# Patient Record
Sex: Male | Born: 1945 | Race: White | Hispanic: No | Marital: Married | Smoking: Former smoker
Health system: Southern US, Community
[De-identification: ages and names within clinical notes are randomized; demographics above are authoritative.]

## PROBLEM LIST (undated history)

## (undated) DIAGNOSIS — K219 Gastro-esophageal reflux disease without esophagitis: Secondary | ICD-10-CM

## (undated) DIAGNOSIS — I10 Essential (primary) hypertension: Secondary | ICD-10-CM

## (undated) DIAGNOSIS — I480 Paroxysmal atrial fibrillation: Secondary | ICD-10-CM

## (undated) DIAGNOSIS — C801 Malignant (primary) neoplasm, unspecified: Secondary | ICD-10-CM

## (undated) DIAGNOSIS — Z9221 Personal history of antineoplastic chemotherapy: Secondary | ICD-10-CM

## (undated) DIAGNOSIS — R37 Sexual dysfunction, unspecified: Secondary | ICD-10-CM

## (undated) DIAGNOSIS — I499 Cardiac arrhythmia, unspecified: Secondary | ICD-10-CM

## (undated) DIAGNOSIS — H409 Unspecified glaucoma: Secondary | ICD-10-CM

## (undated) DIAGNOSIS — E119 Type 2 diabetes mellitus without complications: Secondary | ICD-10-CM

## (undated) DIAGNOSIS — R7303 Prediabetes: Secondary | ICD-10-CM

## (undated) DIAGNOSIS — E785 Hyperlipidemia, unspecified: Secondary | ICD-10-CM

## (undated) HISTORY — PX: ILEOSTOMY: SHX1783

## (undated) HISTORY — PX: APPENDECTOMY: SHX54

## (undated) HISTORY — DX: Unspecified glaucoma: H40.9

---

## 2014-06-12 DIAGNOSIS — K635 Polyp of colon: Secondary | ICD-10-CM | POA: Insufficient documentation

## 2014-06-12 DIAGNOSIS — R37 Sexual dysfunction, unspecified: Secondary | ICD-10-CM | POA: Insufficient documentation

## 2014-06-12 DIAGNOSIS — I4891 Unspecified atrial fibrillation: Secondary | ICD-10-CM | POA: Insufficient documentation

## 2014-06-12 DIAGNOSIS — E7800 Pure hypercholesterolemia, unspecified: Secondary | ICD-10-CM | POA: Insufficient documentation

## 2014-06-12 DIAGNOSIS — K219 Gastro-esophageal reflux disease without esophagitis: Secondary | ICD-10-CM | POA: Insufficient documentation

## 2014-06-12 DIAGNOSIS — I10 Essential (primary) hypertension: Secondary | ICD-10-CM | POA: Insufficient documentation

## 2014-09-02 DIAGNOSIS — R7303 Prediabetes: Secondary | ICD-10-CM | POA: Insufficient documentation

## 2014-09-02 DIAGNOSIS — E1169 Type 2 diabetes mellitus with other specified complication: Secondary | ICD-10-CM | POA: Insufficient documentation

## 2015-10-22 ENCOUNTER — Encounter: Payer: Self-pay | Admitting: *Deleted

## 2015-10-25 ENCOUNTER — Ambulatory Visit
Admission: RE | Admit: 2015-10-25 | Discharge: 2015-10-25 | Disposition: A | Discharge status: Home / Self Care | Payer: Medicare Other | Source: Ambulatory Visit | Attending: Unknown Physician Specialty | Admitting: Unknown Physician Specialty

## 2015-10-25 ENCOUNTER — Ambulatory Visit: Payer: Medicare Other | Admitting: Anesthesiology

## 2015-10-25 ENCOUNTER — Encounter: Payer: Self-pay | Admitting: Anesthesiology

## 2015-10-25 ENCOUNTER — Encounter
Admission: RE | Disposition: A | Discharge status: Home / Self Care | Payer: Self-pay | Source: Ambulatory Visit | Attending: Unknown Physician Specialty

## 2015-10-25 DIAGNOSIS — Z7982 Long term (current) use of aspirin: Secondary | ICD-10-CM | POA: Diagnosis not present

## 2015-10-25 DIAGNOSIS — I480 Paroxysmal atrial fibrillation: Secondary | ICD-10-CM | POA: Diagnosis not present

## 2015-10-25 DIAGNOSIS — E119 Type 2 diabetes mellitus without complications: Secondary | ICD-10-CM | POA: Diagnosis not present

## 2015-10-25 DIAGNOSIS — K219 Gastro-esophageal reflux disease without esophagitis: Secondary | ICD-10-CM | POA: Insufficient documentation

## 2015-10-25 DIAGNOSIS — Z79899 Other long term (current) drug therapy: Secondary | ICD-10-CM | POA: Insufficient documentation

## 2015-10-25 DIAGNOSIS — E785 Hyperlipidemia, unspecified: Secondary | ICD-10-CM | POA: Insufficient documentation

## 2015-10-25 DIAGNOSIS — K640 First degree hemorrhoids: Secondary | ICD-10-CM | POA: Insufficient documentation

## 2015-10-25 DIAGNOSIS — Z87891 Personal history of nicotine dependence: Secondary | ICD-10-CM | POA: Insufficient documentation

## 2015-10-25 DIAGNOSIS — K5730 Diverticulosis of large intestine without perforation or abscess without bleeding: Secondary | ICD-10-CM | POA: Diagnosis not present

## 2015-10-25 DIAGNOSIS — I10 Essential (primary) hypertension: Secondary | ICD-10-CM | POA: Diagnosis not present

## 2015-10-25 DIAGNOSIS — Z1211 Encounter for screening for malignant neoplasm of colon: Secondary | ICD-10-CM | POA: Insufficient documentation

## 2015-10-25 HISTORY — PX: COLONOSCOPY WITH PROPOFOL: SHX5780

## 2015-10-25 HISTORY — DX: Hyperlipidemia, unspecified: E78.5

## 2015-10-25 HISTORY — DX: Gastro-esophageal reflux disease without esophagitis: K21.9

## 2015-10-25 HISTORY — DX: Paroxysmal atrial fibrillation: I48.0

## 2015-10-25 HISTORY — DX: Essential (primary) hypertension: I10

## 2015-10-25 HISTORY — DX: Type 2 diabetes mellitus without complications: E11.9

## 2015-10-25 HISTORY — DX: Sexual dysfunction, unspecified: R37

## 2015-10-25 HISTORY — DX: Cardiac arrhythmia, unspecified: I49.9

## 2015-10-25 SURGERY — COLONOSCOPY WITH PROPOFOL
Anesthesia: General

## 2015-10-25 MED ORDER — SODIUM CHLORIDE 0.9 % IV SOLN: INTRAVENOUS | Status: DC

## 2015-10-25 MED ORDER — MIDAZOLAM HCL 5 MG/5ML IJ SOLN
INTRAMUSCULAR | Status: DC | PRN
  Administered 2015-10-25: 1 mg via INTRAVENOUS

## 2015-10-25 MED ORDER — LIDOCAINE HCL (PF) 1 % IJ SOLN
2.0000 mL | Freq: Once | INTRAMUSCULAR | Status: AC
  Administered 2015-10-25: 0.03 mL via INTRADERMAL

## 2015-10-25 MED ORDER — PROPOFOL 10 MG/ML IV BOLUS
INTRAVENOUS | Status: DC | PRN
  Administered 2015-10-25: 50 mg via INTRAVENOUS

## 2015-10-25 MED ORDER — PROPOFOL 500 MG/50ML IV EMUL
INTRAVENOUS | Status: DC | PRN
  Administered 2015-10-25: 165 ug/kg/min via INTRAVENOUS

## 2015-10-25 MED ORDER — SODIUM CHLORIDE 0.9 % IV SOLN
INTRAVENOUS | Status: DC
Start: 2015-10-25 — End: 2015-10-25
  Administered 2015-10-25: 14:00:00 via INTRAVENOUS
  Administered 2015-10-25: 1000 mL via INTRAVENOUS

## 2015-10-25 MED ORDER — FENTANYL CITRATE (PF) 100 MCG/2ML IJ SOLN
INTRAMUSCULAR | Status: DC | PRN
  Administered 2015-10-25: 50 ug via INTRAVENOUS

## 2015-10-25 MED ORDER — LIDOCAINE HCL (PF) 1 % IJ SOLN
INTRAMUSCULAR | Status: AC
  Administered 2015-10-25: 0.03 mL via INTRADERMAL
  Filled 2015-10-25: qty 2

## 2015-10-25 MED ORDER — LIDOCAINE HCL (CARDIAC) 20 MG/ML IV SOLN
INTRAVENOUS | Status: DC | PRN
  Administered 2015-10-25: 30 mg via INTRAVENOUS

## 2015-10-25 NOTE — Anesthesia Preprocedure Evaluation (Signed)
Anesthesia Evaluation  Patient identified by MRN, date of birth, ID band Patient awake    Reviewed: Allergy & Precautions, H&P , NPO status , Patient's Chart, lab work & pertinent test results, reviewed documented beta blocker date and time   History of Anesthesia Complications Negative for: history of anesthetic complications  Airway Mallampati: III  TM Distance: >3 FB Neck ROM: full    Dental no notable dental hx. (+) Teeth Intact   Pulmonary neg shortness of breath, neg sleep apnea, neg COPD, neg recent URI, former smoker,    Pulmonary exam normal breath sounds clear to auscultation       Cardiovascular Exercise Tolerance: Good hypertension, On Medications and On Home Beta Blockers (-) angina(-) CAD, (-) Past MI, (-) Cardiac Stents and (-) CABG Normal cardiovascular exam+ dysrhythmias Atrial Fibrillation (-) Valvular Problems/Murmurs Rhythm:regular Rate:Normal     Neuro/Psych negative neurological ROS  negative psych ROS   GI/Hepatic Neg liver ROS, GERD  Medicated and Controlled,  Endo/Other  diabetes (pre-diabetes)  Renal/GU negative Renal ROS  negative genitourinary   Musculoskeletal   Abdominal   Peds  Hematology negative hematology ROS (+)   Anesthesia Other Findings Past Medical History:   Hypertension                                                 Dysrhythmia                                                  Paroxysmal atrial fibrillation (HCC)                         GERD (gastroesophageal reflux disease)                       Sexual dysfunction                                           Hyperlipidemia                                               Diabetes mellitus without complication (HCC)                 Reproductive/Obstetrics negative OB ROS                             Anesthesia Physical Anesthesia Plan  ASA: III  Anesthesia Plan: General   Post-op Pain Management:     Induction:   Airway Management Planned:   Additional Equipment:   Intra-op Plan:   Post-operative Plan:   Informed Consent: I have reviewed the patients History and Physical, chart, labs and discussed the procedure including the risks, benefits and alternatives for the proposed anesthesia with the patient or authorized representative who has indicated his/her understanding and acceptance.   Dental Advisory Given  Plan Discussed with: Anesthesiologist, CRNA and Surgeon  Anesthesia Plan Comments:  Anesthesia Quick Evaluation  

## 2015-10-25 NOTE — H&P (Signed)
   Primary Care Physician:  Dion Body, MD Primary Gastroenterologist:  Dr. Vira Agar  Pre-Procedure History & Physical: HPI:  Randy Wheeler is a 69 y.o. male is here for an colonoscopy.   Past Medical History  Diagnosis Date  . Hypertension   . Dysrhythmia   . Paroxysmal atrial fibrillation (HCC)   . GERD (gastroesophageal reflux disease)   . Sexual dysfunction   . Hyperlipidemia   . Diabetes mellitus without complication [facility])     Past Surgical History  Procedure Laterality Date  . Appendectomy      Prior to Admission medications   Medication Sig Start Date End Date Taking? Authorizing Provider  aspirin 81 MG tablet Take 81 mg by mouth daily.   Yes Historical Provider, MD  hydrochlorothiazide (HYDRODIURIL) 25 MG tablet Take 25 mg by mouth daily.   Yes Historical Provider, MD  metoprolol succinate (TOPROL-XL) 50 MG 24 hr tablet Take 50 mg by mouth daily. Take with or immediately following a meal.   Yes Historical Provider, MD  omeprazole (PRILOSEC) 20 MG capsule Take 20 mg by mouth daily.   Yes Historical Provider, MD  Red Yeast Rice 600 MG CAPS Take 1 capsule by mouth daily.   Yes Historical Provider, MD    Allergies as of 09/27/2015  . (Not on File)    History reviewed. No pertinent family history.  Social History   Social History  . Marital Status: Married    Spouse Name: N/A  . Number of Children: N/A  . Years of Education: N/A   Occupational History  . Not on file.   Social History Main Topics  . Smoking status: Former Smoker    Types: Cigarettes    Quit date: 10/21/1997  . Smokeless tobacco: Never Used  . Alcohol Use: Yes  . Drug Use: No  . Sexual Activity: Not on file   Other Topics Concern  . Not on file   Social History Narrative    Review of Systems: See HPI, otherwise negative ROS  Physical Exam: BP 129/92 mmHg  Pulse 88  Temp(Src) 98.1 F (36.7 C)  Resp 17  Ht 6' (1.829 m)  Wt 92.987 kg (205 lb)  BMI 27.80 kg/m2  SpO2  99% General:   Alert,  pleasant and cooperative in NAD Head:  Normocephalic and atraumatic. Neck:  Supple; no masses or thyromegaly. Lungs:  Clear throughout to auscultation.    Heart:  Regular rate and rhythm. Abdomen:  Soft, nontender and nondistended. Normal bowel sounds, without guarding, and without rebound.   Neurologic:  Alert and  oriented x4;  grossly normal neurologically.  Impression/Plan: Randy Wheeler is here for an colonoscopy to be performed for screening  Risks, benefits, limitations, and alternatives regarding  colonoscopy have been reviewed with the patient.  Questions have been answered.  All parties agreeable.   Gaylyn Cheers, MD  10/25/2015, 1:59 PM

## 2015-10-25 NOTE — Transfer of Care (Signed)
Immediate Anesthesia Transfer of Care Note  Patient: Randy Wheeler  Procedure(s) Performed: Procedure(s): COLONOSCOPY WITH PROPOFOL (N/A)  Patient Location: PACU and Short Stay  Anesthesia Type:General  Level of Consciousness: awake, oriented and patient cooperative  Airway & Oxygen Therapy: Patient Spontanous Breathing and Patient connected to nasal cannula oxygen  Post-op Assessment: Report given to RN and Post -op Vital signs reviewed and stable  Post vital signs: Reviewed and stable  Last Vitals:  Filed Vitals:   10/25/15 1432  BP:   Pulse: 87  Temp: 35.8 C  Resp:     Complications: No apparent anesthesia complications

## 2015-10-25 NOTE — Op Note (Signed)
[facility]astro[facility] Patient Name: Jonothan Heberle Procedure Date: 10/25/2015 1:51 PM MRN: [id number] Account #: [id number] Date of Birth: 05-04-1946 Admit Type: Outpatient Age: 69 Room: [facility] ENDO ROOM 1 Gender: Male Note Status: Finalized Procedure:         Colonoscopy Indications:       Screening for colorectal malignant neoplasm Providers:         Manya Silvas, MD Referring MD:      Dion Body (Referring MD) Medicines:         Propofol per Anesthesia Complications:     No immediate complications. Procedure:         Pre-Anesthesia Assessment:                    - After reviewing the risks and benefits, the patient was                     deemed in satisfactory condition to undergo the procedure.                    After obtaining informed consent, the colonoscope was                     passed under direct vision. Throughout the procedure, the                     patient's blood pressure, pulse, and oxygen saturations                     were monitored continuously. The Colonoscope was                     introduced through the anus and advanced to the the cecum,                     identified by appendiceal orifice and ileocecal valve. The                     colonoscopy was performed without difficulty. The patient                     tolerated the procedure well. The quality of the bowel                     preparation was good. Findings:      Multiple small and large-mouthed diverticula were found in the sigmoid       colon and in the descending colon.      Internal hemorrhoids were found during endoscopy. The hemorrhoids were       small, medium-sized and Grade I (internal hemorrhoids that do not       prolapse).      The exam was otherwise without abnormality. Impression:        - Diverticulosis in the sigmoid colon and in the                     descending colon.                    - Internal hemorrhoids.                    - The  examination was otherwise normal.                    - No  specimens collected. Recommendation:    - Repeat colonoscopy in 10 years for screening purposes. Manya Silvas, MD 10/25/2015 2:27:45 PM This report has been signed electronically. Number of Addenda: 0 Note Initiated On: 10/25/2015 1:51 PM Scope Withdrawal Time: 0 hours 4 minutes 45 seconds  Total Procedure Duration: 0 hours 11 minutes 18 seconds       [facility]

## 2015-10-26 ENCOUNTER — Encounter: Payer: Self-pay | Admitting: Unknown Physician Specialty

## 2015-10-26 NOTE — Anesthesia Postprocedure Evaluation (Signed)
  Anesthesia Post-op Note  Patient: Randy Wheeler  Procedure(s) Performed: Procedure(s): COLONOSCOPY WITH PROPOFOL (N/A)  Anesthesia type:General  Patient location: PACU  Post pain: Pain level controlled  Post assessment: Post-op Vital signs reviewed, Patient's Cardiovascular Status Stable, Respiratory Function Stable, Patent Airway and No signs of Nausea or vomiting  Post vital signs: Reviewed and stable  Last Vitals:  Filed Vitals:   10/25/15 1502  BP: 116/99  Pulse: 71  Temp:   Resp: 13    Level of consciousness: awake, alert  and patient cooperative  Complications: No apparent anesthesia complications

## 2016-05-23 DIAGNOSIS — Z7189 Other specified counseling: Secondary | ICD-10-CM | POA: Insufficient documentation

## 2016-05-23 DIAGNOSIS — Z7185 Encounter for immunization safety counseling: Secondary | ICD-10-CM | POA: Insufficient documentation

## 2017-02-28 ENCOUNTER — Ambulatory Visit (INDEPENDENT_AMBULATORY_CARE_PROVIDER_SITE_OTHER): Payer: Medicare Other | Admitting: Urology

## 2017-02-28 ENCOUNTER — Encounter: Payer: Self-pay | Admitting: Urology

## 2017-02-28 VITALS — BP 133/95 | HR 98 | Ht 72.0 in | Wt 211.0 lb

## 2017-02-28 DIAGNOSIS — Z87891 Personal history of nicotine dependence: Secondary | ICD-10-CM

## 2017-02-28 DIAGNOSIS — N529 Male erectile dysfunction, unspecified: Secondary | ICD-10-CM | POA: Diagnosis not present

## 2017-02-28 DIAGNOSIS — R310 Gross hematuria: Secondary | ICD-10-CM

## 2017-02-28 LAB — URINALYSIS, COMPLETE
Bilirubin, UA: NEGATIVE
GLUCOSE, UA: NEGATIVE
Ketones, UA: NEGATIVE
LEUKOCYTES UA: NEGATIVE
Nitrite, UA: NEGATIVE
Specific Gravity, UA: 1.01 (ref 1.005–1.030)
UUROB: 0.2 mg/dL (ref 0.2–1.0)
pH, UA: 7 (ref 5.0–7.5)

## 2017-02-28 LAB — MICROSCOPIC EXAMINATION: RBC, UA: >30 /hpf — ABNORMAL HIGH (ref 0–?)

## 2017-02-28 NOTE — Progress Notes (Signed)
02/28/2017 7:56 AM   Randy Wheeler 1946-04-25 [id number]  Referring provider: Dion Body, MD [facility] [place], [place] [address]  Chief Complaint  Patient presents with  . Hematuria    New Patient    HPI: 71 year old male from Dr. Netty Starring who presents today for further evaluation of microscopic hematuria/ gross hematuria.  He did have an episodes of painless gross hematuria after fall on a boat in 07/2016.  He has seen trace blood in his urine a few other times including last week which was bright red lasting a few days.  Few small clots which finally cleared up 2 days ago.    Urinalysis performed his PCP showed greater than 50 red blood cells per high-powered field. He also had 4-10 white blood cells with cloudy red colored urine. His urine culture associated with this was negative.  UA repeated today in our office is very similar with greater than 30 red blood cells per high-powered field and 6-10 white blood cells per. 1+ protein. Few bacteria.  He does have personal history of Afib on Eliquis.   He stopped taking this under the direction of his PCP on his gross hematuria progressed.  No accociated dysuria, UTIs, or flank pain.     At baseline, he does have some post void dribbling, nocturia x 1.  Stream is decent.  No urgency or frequency.      He is a remote smoker, 20 years x 1 ppd but quit 1998.    He does have baseline ED.     PMH: Past Medical History:  Diagnosis Date  . Diabetes mellitus without complication ([place])   . Dysrhythmia   . GERD (gastroesophageal reflux disease)   . Hyperlipidemia   . Hypertension   . Paroxysmal atrial fibrillation (HCC)   . Sexual dysfunction     Surgical History: Past Surgical History:  Procedure Laterality Date  . APPENDECTOMY    . COLONOSCOPY WITH PROPOFOL N/A 10/25/2015   Procedure: COLONOSCOPY WITH PROPOFOL;  Surgeon: Manya Silvas, MD;  Location: [facility] ENDOSCOPY;  Service:  Endoscopy;  Laterality: N/A;    Home Medications:  Allergies as of 02/28/2017   No Known Allergies     Medication List       Accurate as of 02/28/17 11:59 PM. Always use your most recent med list.          ELIQUIS 5 MG Tabs tablet Generic drug:  apixaban TAKE ONE TABLET TWICE DAILY   hydrochlorothiazide 25 MG tablet Commonly known as:  HYDRODIURIL Take 25 mg by mouth daily.   metoprolol succinate 50 MG 24 hr tablet Commonly known as:  TOPROL-XL Take 50 mg by mouth daily. Take with or immediately following a meal.   omeprazole 20 MG capsule Commonly known as:  PRILOSEC Take 20 mg by mouth daily.   Red Yeast Rice 600 MG Caps Take 1 capsule by mouth daily.       Allergies: No Known Allergies  Family History: Family History  Problem Relation Age of Onset  . Prostate cancer Father   . Stroke Father   . Kidney cancer Neg Hx     Social History:  reports that he quit smoking about 19 years ago. His smoking use included Cigarettes. He has never used smokeless tobacco. He reports that he drinks alcohol. He reports that he does not use drugs.  ROS: UROLOGY Frequent Urination?: No Hard to postpone urination?: No Burning/pain with urination?: No Get up at night  to urinate?: Yes Leakage of urine?: No Urine stream starts and stops?: Yes Trouble starting stream?: No Do you have to strain to urinate?: No Blood in urine?: Yes Urinary tract infection?: No Sexually transmitted disease?: No Injury to kidneys or bladder?: No Painful intercourse?: No Weak stream?: Yes Erection problems?: Yes Penile pain?: No  Gastrointestinal Nausea?: No Vomiting?: No Indigestion/heartburn?: Yes Diarrhea?: No Constipation?: No  Constitutional Fever: No Night sweats?: No Weight loss?: No Fatigue?: No  Skin Skin rash/lesions?: No Itching?: No  Eyes Blurred vision?: No Double vision?: No  Ears/Nose/Throat Sore throat?: No Sinus problems?:  No  Hematologic/Lymphatic Swollen glands?: No Easy bruising?: No  Cardiovascular Leg swelling?: No Chest pain?: No  Respiratory Cough?: No Shortness of breath?: No  Endocrine Excessive thirst?: No  Musculoskeletal Back pain?: No Joint pain?: Yes  Neurological Headaches?: No Dizziness?: No  Psychologic Depression?: No Anxiety?: No  Physical Exam: BP (!) 133/95   Pulse 98   Ht 6' (1.829 m)   Wt 211 lb (95.7 kg)   BMI 28.62 kg/m   Constitutional:  Alert and oriented, No acute distress. HEENT: [place] AT, moist mucus membranes.  Trachea midline, no masses. Cardiovascular: No clubbing, cyanosis, or edema. Respiratory: Normal respiratory effort, no increased work of breathing. GI: Abdomen is soft, nontender, nondistended, no abdominal masses GU: No CVA tenderness.  Skin: No rashes, bruises or suspicious lesions. Neurologic: Grossly intact, no focal deficits, moving all 4 extremities. Psychiatric: Normal mood and affect.  Laboratory Data: Most recent PSA 08/2015  0.87  Urinalysis Results for orders placed or performed in visit on 02/28/17  Microscopic Examination  Result Value Ref Range   WBC, UA 6-10 (A) 0 - 5 /hpf   RBC, UA >30 (H) 0 - 2 /hpf   Epithelial Cells (non renal) 0-10 0 - 10 /hpf   Mucus, UA Present (A) Not Estab.   Bacteria, UA Few (A) None seen/Few  Urinalysis, Complete  Result Value Ref Range   Specific Gravity, UA 1.010 1.005 - 1.030   pH, UA 7.0 5.0 - 7.5   Color, UA Yellow Yellow   Appearance Ur Cloudy (A) Clear   Leukocytes, UA Negative Negative   Protein, UA 1+ (A) Negative/Trace   Glucose, UA Negative Negative   Ketones, UA Negative Negative   RBC, UA 3+ (A) Negative   Bilirubin, UA Negative Negative   Urobilinogen, Ur 0.2 0.2 - 1.0 mg/dL   Nitrite, UA Negative Negative   Microscopic Examination See below:     Pertinent Imaging: pending  Assessment & Plan:    1. Gross hematuria We discussed the differential diagnosis for  microscopic/ gross hematuria including nephrolithiasis, renal or upper tract tumors, bladder stones, UTIs, or bladder tumors as well as undetermined etiologies.  Per AUA guidelines, I did recommend complete microscopic hematuria evaluation including CTU, possible urine cytology, and office cystoscopy.  I have recommended continuing to hold Xarelto until workup complete given the severity of his hematuria.  - Urinalysis, Complete - CT ABDOMEN PELVIS W WO CONTRAST; Future  2. Erectile dysfunction, unspecified erectile dysfunction type Will discuss further in future  3. History of smoking Increased risk for TCC   Return in about 4 weeks (around 03/28/2017) for cysto.  Hollice Espy, MD  [facility] [address] [place], [place] [address] [phone]

## 2017-03-09 ENCOUNTER — Telehealth: Payer: Self-pay | Admitting: Urology

## 2017-03-09 ENCOUNTER — Ambulatory Visit
Admission: RE | Admit: 2017-03-09 | Discharge: 2017-03-09 | Disposition: A | Discharge status: Home / Self Care | Payer: Medicare Other | Source: Ambulatory Visit | Attending: Urology | Admitting: Urology

## 2017-03-09 DIAGNOSIS — K869 Disease of pancreas, unspecified: Secondary | ICD-10-CM

## 2017-03-09 DIAGNOSIS — N329 Bladder disorder, unspecified: Secondary | ICD-10-CM | POA: Insufficient documentation

## 2017-03-09 DIAGNOSIS — R310 Gross hematuria: Secondary | ICD-10-CM

## 2017-03-09 LAB — POCT I-STAT CREATININE: Creatinine, Ser: 1 mg/dL (ref 0.61–1.24)

## 2017-03-09 MED ORDER — IOPAMIDOL (ISOVUE-300) INJECTION 61%
125.0000 mL | Freq: Once | INTRAVENOUS | Status: AC | PRN
  Administered 2017-03-09: 125 mL via INTRAVENOUS

## 2017-03-09 NOTE — Telephone Encounter (Signed)
CT scan findings reviewed with the patient. 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 There is no evidence of extravesical extension or lymphadenopathy.  Options discussed with patient including expediting office cystoscopy versus proceeding directly to the operating room for TURBT were discussed. He is most interested in proceeding with surgery as soon as possible. He will be on vacation the first week of April will like to have this done as soon as he returns.  Risk and benefits of the procedure were discussed at length. Given the location of the tumor, it may or may not involve the right ureteral orifice. As such, we discussed the possibility of placing a right ureteral stent if deemed necessary. Risks including bleeding, infection, damage to this running structures, bladder injury, need for further treatment were all reviewed. All of his questions were answered.  In addition, he does have a lesion on his pancreas which requires further workup. MRI ordered and this was discussed with the patient as well.  Plan to proceed with TURBT, possible right ureteral stent placement, instillation of mitomycin.  Randy Espy, MD

## 2017-03-12 NOTE — Telephone Encounter (Signed)
Pt would like for you to give him a call at your earliest convenience.  He said that was a lot of information he spoke with you about and would like for you to speak with his wife also.

## 2017-03-12 NOTE — Telephone Encounter (Signed)
Why don't you have them both come in on Thursday around 10:45?  I can review the CT in person with them which will be helpful.     Hollice Espy, MD

## 2017-03-12 NOTE — Telephone Encounter (Signed)
Spoke with patient and his wife and notified them that his surgery was scheduled for 03-26-17 for a TURBT, patient states he will be out of town so surgery was rescheduled for 04-02-17. Apt with [name] to discuss results in detail per previous message was made for Thursday 03-15-17@10 :45 and patient will then go to pre-op at 1pm. Patient verbalized understanding and is in agreement with this plan.

## 2017-03-15 ENCOUNTER — Ambulatory Visit (INDEPENDENT_AMBULATORY_CARE_PROVIDER_SITE_OTHER): Payer: Medicare Other | Admitting: Urology

## 2017-03-15 ENCOUNTER — Encounter: Payer: Self-pay | Admitting: Urology

## 2017-03-15 ENCOUNTER — Encounter
Admission: RE | Admit: 2017-03-15 | Discharge: 2017-03-15 | Disposition: A | Discharge status: Home / Self Care | Payer: Medicare Other | Source: Ambulatory Visit | Attending: Urology | Admitting: Urology

## 2017-03-15 VITALS — BP 114/76 | HR 86 | Ht 72.0 in | Wt 211.4 lb

## 2017-03-15 DIAGNOSIS — I10 Essential (primary) hypertension: Secondary | ICD-10-CM | POA: Diagnosis not present

## 2017-03-15 DIAGNOSIS — R310 Gross hematuria: Secondary | ICD-10-CM | POA: Diagnosis not present

## 2017-03-15 DIAGNOSIS — N3289 Other specified disorders of bladder: Secondary | ICD-10-CM

## 2017-03-15 DIAGNOSIS — Z01812 Encounter for preprocedural laboratory examination: Secondary | ICD-10-CM | POA: Insufficient documentation

## 2017-03-15 DIAGNOSIS — Z01810 Encounter for preprocedural cardiovascular examination: Secondary | ICD-10-CM | POA: Insufficient documentation

## 2017-03-15 DIAGNOSIS — K869 Disease of pancreas, unspecified: Secondary | ICD-10-CM

## 2017-03-15 DIAGNOSIS — I4891 Unspecified atrial fibrillation: Secondary | ICD-10-CM | POA: Diagnosis not present

## 2017-03-15 HISTORY — DX: Prediabetes: R73.03

## 2017-03-15 HISTORY — DX: Malignant (primary) neoplasm, unspecified: C80.1

## 2017-03-15 LAB — BASIC METABOLIC PANEL
ANION GAP: 8 (ref 5–15)
BUN: 28 mg/dL — ABNORMAL HIGH (ref 6–20)
CALCIUM: 9.4 mg/dL (ref 8.9–10.3)
CO2: 31 mmol/L (ref 22–32)
Chloride: 99 mmol/L — ABNORMAL LOW (ref 101–111)
Creatinine, Ser: 0.84 mg/dL (ref 0.61–1.24)
Glucose, Bld: 112 mg/dL — ABNORMAL HIGH (ref 65–99)
Potassium: 3.9 mmol/L (ref 3.5–5.1)
SODIUM: 138 mmol/L (ref 135–145)

## 2017-03-15 LAB — CBC
HEMATOCRIT: 44.1 % (ref 40.0–52.0)
Hemoglobin: 15.1 g/dL (ref 13.0–18.0)
MCH: 32 pg (ref 26.0–34.0)
MCHC: 34.2 g/dL (ref 32.0–36.0)
MCV: 93.5 fL (ref 80.0–100.0)
PLATELETS: 240 10*3/uL (ref 150–440)
RBC: 4.71 MIL/uL (ref 4.40–5.90)
RDW: 14.1 % (ref 11.5–14.5)
WBC: 6.7 10*3/uL (ref 3.8–10.6)

## 2017-03-15 LAB — URINALYSIS, COMPLETE (UACMP) WITH MICROSCOPIC
BACTERIA UA: NONE SEEN
Bilirubin Urine: NEGATIVE
Glucose, UA: NEGATIVE mg/dL
Ketones, ur: NEGATIVE mg/dL
LEUKOCYTES UA: NEGATIVE
Nitrite: NEGATIVE
PROTEIN: 100 mg/dL — AB
Specific Gravity, Urine: 1.019 (ref 1.005–1.030)
pH: 6 (ref 5.0–8.0)

## 2017-03-15 LAB — PROTIME-INR
INR: 0.99
PROTHROMBIN TIME: 13.1 s (ref 11.4–15.2)

## 2017-03-15 LAB — TYPE AND SCREEN
ABO/RH(D): O POS
Antibody Screen: NEGATIVE

## 2017-03-15 LAB — APTT: APTT: 29 s (ref 24–36)

## 2017-03-15 NOTE — Progress Notes (Signed)
03/15/2017 2:16 PM   Randy Wheeler [birth year] [id number]  Referring provider: Dion Body, MD [facility] [place],  [place] [id number]  Chief Complaint  Patient presents with  . Follow-up    Discuss Surgery    HPI: 71 year old male who returned today for follow-up CT urogram. This was obtained in the setting of episodic gross hematuria many months. 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  There is no associated pelvic lymphadenopathy for upper tract filling defects appreciated.   In addition, a 2 cm hypoattenuating lesion in the pancreas was identified incidentally.  He does have personal history of Afib on Eliquis.   He stopped taking this under the direction of his PCP on his gross hematuria progressed.  No accociated dysuria, UTIs, or flank pain.     At baseline, he does have some post void dribbling, nocturia x 1.  Stream is decent.  No urgency or frequency.      He is a remote smoker, 20 years x 1 ppd but quit 1998.   Findings were previously reviewed by telephone with the patient has additional questions today. He is also accompanied by his wife.   PMH: Past Medical History:  Diagnosis Date  . Cancer [facility])    Bladder  . Chronic kidney disease   . Dysrhythmia   . GERD (gastroesophageal reflux disease)   . Hyperlipidemia   . Hypertension   . Paroxysmal atrial fibrillation (HCC)   . Pre-diabetes    diet controlled  . Sexual dysfunction     Surgical History: Past Surgical History:  Procedure Laterality Date  . APPENDECTOMY    . COLONOSCOPY WITH PROPOFOL N/A 10/25/2015   Procedure: COLONOSCOPY WITH PROPOFOL;  Surgeon: Manya Silvas, MD;  Location: [facility] ENDOSCOPY;  Service: Endoscopy;  Laterality: N/A;    Home Medications:  Allergies as of 03/15/2017   No Known Allergies     Medication List       Accurate as of 03/15/17 11:59 PM. Always  use your most recent med list.          ELIQUIS 5 MG Tabs tablet Generic drug:  apixaban TAKE ONE TABLET TWICE DAILY   famotidine 10 MG chewable tablet Commonly known as:  PEPCID AC Chew 10 mg by mouth daily as needed for heartburn.   hydrochlorothiazide 25 MG tablet Commonly known as:  HYDRODIURIL Take 25 mg by mouth daily.   metoprolol succinate 50 MG 24 hr tablet Commonly known as:  TOPROL-XL Take 50 mg by mouth every evening. Take with or immediately following a meal.   omeprazole 20 MG capsule Commonly known as:  PRILOSEC Take 20 mg by mouth daily as needed (heartburn).   oxymetazoline 0.05 % nasal spray Commonly known as:  AFRIN Place 1 spray into left nostril at bedtime as needed for congestion.       Allergies: No Known Allergies  Family History: Family History  Problem Relation Age of Onset  . Prostate cancer Father   . Stroke Father   . Atrial fibrillation Father   . Diabetes type II Father   . Kidney cancer Neg Hx     Social History:  reports that he quit smoking about 19 years ago. His smoking use included Cigarettes. He smoked 1.00 pack per day. He has never used smokeless tobacco. He reports that he drinks alcohol. He reports that he does not use drugs.  ROS: UROLOGY Frequent Urination?:  No Hard to postpone urination?: No Burning/pain with urination?: No Get up at night to urinate?: No Leakage of urine?: No Urine stream starts and stops?: No Trouble starting stream?: No Do you have to strain to urinate?: No Blood in urine?: No Urinary tract infection?: No Sexually transmitted disease?: No Injury to kidneys or bladder?: No Painful intercourse?: No Weak stream?: No Erection problems?: No Penile pain?: No  Gastrointestinal Nausea?: No Vomiting?: No Indigestion/heartburn?: No Diarrhea?: No Constipation?: No  Constitutional Fever: No Night sweats?: No Weight loss?: No Fatigue?: No  Skin Skin rash/lesions?: No Itching?:  No  Eyes Blurred vision?: No Double vision?: No  Ears/Nose/Throat Sore throat?: No Sinus problems?: No  Hematologic/Lymphatic Swollen glands?: No Easy bruising?: No  Cardiovascular Leg swelling?: No Chest pain?: No  Respiratory Cough?: No Shortness of breath?: No  Endocrine Excessive thirst?: No  Musculoskeletal Back pain?: No Joint pain?: No  Neurological Headaches?: No Dizziness?: No  Psychologic Depression?: No Anxiety?: No  Physical Exam: BP 114/76 (BP Location: Left Arm, Patient Position: Sitting, Cuff Size: Normal)   Pulse 86   Ht 6' (1.829 m)   Wt 211 lb 6.4 oz (95.9 kg)   BMI 28.67 kg/m   Constitutional:  Alert and oriented, No acute distress.  Accompanied by wife today. HEENT:  AT, moist mucus membranes.  Trachea midline, no masses. Cardiovascular: No clubbing, cyanosis, or edema. Respiratory: Normal respiratory effort, no increased work of breathing. GI: Abdomen is soft, nontender, nondistended, no abdominal masses GU: No CVA tenderness.  Skin: No rashes, bruises or suspicious lesions. Neurologic: Grossly intact, no focal deficits, moving all 4 extremities. Psychiatric: Normal mood and affect.  Laboratory Data:  Lab Results  Component Value Date   CREATININE 0.84 03/15/2017   Urinalysis Pending preop  Pertinent Imaging: CLINICAL DATA:  Two week history of gross hematuria.  EXAM: CT ABDOMEN AND PELVIS WITHOUT AND WITH CONTRAST  TECHNIQUE: Multidetector CT imaging of the abdomen and pelvis was performed following the standard protocol before and following the bolus administration of intravenous contrast.  CONTRAST:  155mL ISOVUE-300 IOPAMIDOL (ISOVUE-300) INJECTION 61%  COMPARISON:  None.  FINDINGS: Lower chest: Calcified granuloma identified right lung base. Otherwise unremarkable.  Hepatobiliary: The liver shows diffusely decreased attenuation suggesting steatosis. 2 cm cyst identified lateral segment left liver.  Other smaller adjacent cysts noted. Tiny hypoattenuating lesion posterior right liver is too small to characterize. No enhancing mass within the liver parenchyma. There is no evidence for gallstones, gallbladder wall thickening, or pericholecystic fluid. No intrahepatic or extrahepatic biliary dilation.  Pancreas: Exophytic 1.9 x 1.5 cm low-density lesions identified at the uncinate process of the pancreas, along the cranial wall of the transverse duodenum (see image 36 series 4 and image 45 series 10). No dilatation main pancreatic duct.  Spleen: No splenomegaly. No focal mass lesion.  Adrenals/Urinary Tract: No adrenal nodule or mass.  Precontrast imaging shows no stones in either kidney. No ureteral or bladder stones.  Imaging after IV contrast administration shows no enhancing lesion in either kidney. 4.2 cm simple cyst identified upper pole left kidney.  Delayed imaging shows no abnormality of either intrarenal collecting system or renal pelvis. Both ureters are well opacified and show no focal dilatation, wall thickening, or intraluminal filling defect.  Delayed imaging through the bladder reveals any 4.2 x 5.6 x 4.8 cm polypoid mass lesion with irregular margins arising from the posterior right bladder wall.  Stomach/Bowel: Stomach is nondistended. No gastric wall thickening. No evidence of outlet obstruction. Duodenum is normally  positioned as is the ligament of Treitz. No small bowel wall thickening. No small bowel dilatation. The terminal ileum is normal. Nonvisualization appendix compatible with prior appendectomy. Diverticuli are seen scattered along the entire length of the colon without CT findings of diverticulitis.  Vascular/Lymphatic: There is abdominal aortic atherosclerosis without aneurysm. There is no gastrohepatic or hepatoduodenal ligament lymphadenopathy. No intraperitoneal or retroperitoneal lymphadenopathy. No pelvic sidewall  lymphadenopathy.  Reproductive: Prostate gland upper normal for size.  Other: No intraperitoneal free fluid.  Musculoskeletal: Bone windows reveal no worrisome lytic or sclerotic osseous lesions.  IMPRESSION: 1. 5.6 cm polypoid mass arises from the posterior right bladder wall, consistent with urothelial neoplasm. 2. 2 cm exophytic hypoattenuating lesion identified along the uncinate process of the pancreas. Dedicated abdominal MRI without and with contrast may prove helpful to further evaluate. 3.  Abdominal Aortic Atherosclerois (ICD10-170.0) These results will be called to the ordering clinician or representative by the Radiologist Assistant, and communication documented in the PACS or zVision Dashboard.   Electronically Signed   By: Misty Stanley M.D.   On: 03/09/2017 16:48  CT scan personally reviewed today with the patient.  Assessment & Plan:    1. Bladder mass 5.6 cm right posterior wall bladder lesion highly suspicious for TCC.  There is no evidence of extravesical extension or lymphadenopathy.  Options discussed with patient including expediting office cystoscopy versus proceeding directly to the operating room for TURBT were discussed. He is most interested in proceeding with surgery as soon as possible. He will be on vacation the first week of April will like to have this done as soon as he returns.  Risk and benefits of the procedure were discussed at length. Given the location of the tumor, it may or may not involve the right ureteral orifice. As such, we discussed the possibility of placing a right ureteral stent if deemed necessary. Risks including bleeding, infection, damage to this running structures, bladder injury, need for further treatment were all reviewed. All of his questions were answered.  2. Gross hematuria Likely secondary to #2  3. Pancreatic lesion In addition, he does have a lesion on his pancreas which requires further workup. MRI  ordered and this was discussed with the patient as well.   Plan to proceed with TURBT, possible right ureteral stent placement, instillation of mitomycin.   Hollice Espy, MD  [facility] [address], [place],  [id number] [phone]  I spent 25 min with this patient of which greater than 50% was spent in counseling and coordination of care with the patient.

## 2017-03-15 NOTE — Patient Instructions (Signed)
Your procedure is scheduled on: April 02, 2017 (Monday) Report to Same Day Surgery 2nd floor medical mall [facility] Entrance-take elevator on left to 2nd floor.  Check in with surgery information desk.) To find out your arrival time please call [phone] between 1PM - 3PM on March 30, 2017 (Friday)  Remember: Instructions that are not followed completely may result in serious medical risk, up to and including death, or upon the discretion of your surgeon and anesthesiologist your surgery may need to be rescheduled.    _x___ 1. Do not eat food or drink liquids after midnight. No gum chewing or hard candies                                 __x__ 2. No Alcohol for 24 hours before or after surgery.   __x__3. No Smoking for 24 prior to surgery.   ____  4. Bring all medications with you on the day of surgery if instructed.    __x__ 5. Notify your doctor if there is any change in your medical condition     (cold, fever, infections).     Do not wear jewelry, make-up, hairpins, clips or nail polish.  Do not wear lotions, powders, or perfumes. You may wear deodorant.  Do not shave 48 hours prior to surgery. Men may shave face and neck.  Do not bring valuables to the hospital.    [facility] is not responsible for any belongings or valuables.               Contacts, dentures or bridgework may not be worn into surgery.  Leave your suitcase in the car. After surgery it may be brought to your room.  For patients admitted to the hospital, discharge time is determined by your treatment team                      Patients discharged the day of surgery will not be allowed to drive home.  You will need someone to drive you home and stay with you the night of your procedure.    Please read over the following fact sheets that you were given:   [facility] Preparing for Surgery and or MRSA Information   _x___ Take anti-hypertensive (unless it includes a diuretic), cardiac, seizure, asthma,      anti-reflux and psychiatric medicines. These include:  1. Prilosec (Prilosec at bedtime on Sunday night prior to surgery)   .  ____Fleets enema or Magnesium Citrate as directed.   _x_ Use CHG Soap or sage wipes as directed on instruction sheet   ____ Use inhalers on the day of surgery and bring to hospital day of surgery  ____ Stop Metformin and Janumet 2 days prior to surgery.    ____ Take 1/2 of usual insulin dose the night before surgery and none on the morning     surgery.   _x___ Follow recommendations from Cardiologist, Pulmonologist or PCP regarding          stopping Aspirin, Coumadin, Pllavix ,Eliquis, Effient, or Pradaxa, and Pletal. (Eliquis on hold at present )  X____Stop Anti-inflammatories such as Advil, Aleve, Ibuprofen, Motrin, Naproxen, Naprosyn, Goodies powders or aspirin products. OK to take Tylenol    _x___ Stop supplements until after surgery.  But may continue Vitamin D, Vitamin B, and multivitamin         ____ Bring C-Pap to the hospital.

## 2017-03-16 LAB — URINE CULTURE: CULTURE: NO GROWTH

## 2017-03-19 NOTE — Pre-Procedure Instructions (Signed)
EKG NOTED AND PATIENT HAS HISTORY OF AFIB

## 2017-03-30 ENCOUNTER — Ambulatory Visit
Admission: RE | Admit: 2017-03-30 | Discharge: 2017-03-30 | Disposition: A | Discharge status: Home / Self Care | Payer: Medicare Other | Source: Ambulatory Visit | Attending: Urology | Admitting: Urology

## 2017-03-30 DIAGNOSIS — K869 Disease of pancreas, unspecified: Secondary | ICD-10-CM

## 2017-03-30 DIAGNOSIS — Z8551 Personal history of malignant neoplasm of bladder: Secondary | ICD-10-CM | POA: Insufficient documentation

## 2017-03-30 MED ORDER — GADOBENATE DIMEGLUMINE 529 MG/ML IV SOLN
20.0000 mL | Freq: Once | INTRAVENOUS | Status: AC | PRN
  Administered 2017-03-30: 19 mL via INTRAVENOUS

## 2017-04-01 MED ORDER — CEFAZOLIN SODIUM-DEXTROSE 2-4 GM/100ML-% IV SOLN
2.0000 g | Freq: Once | INTRAVENOUS | Status: AC
  Administered 2017-04-02: 2 g via INTRAVENOUS

## 2017-04-02 ENCOUNTER — Encounter
Admission: RE | Disposition: A | Discharge status: Home / Self Care | Payer: Self-pay | Source: Ambulatory Visit | Attending: Urology

## 2017-04-02 ENCOUNTER — Ambulatory Visit: Payer: Medicare Other | Admitting: Certified Registered Nurse Anesthetist

## 2017-04-02 ENCOUNTER — Encounter: Payer: Self-pay | Admitting: *Deleted

## 2017-04-02 ENCOUNTER — Observation Stay
Admission: RE | Admit: 2017-04-02 | Discharge: 2017-04-03 | Disposition: A | Discharge status: Home / Self Care | Payer: Medicare Other | Source: Ambulatory Visit | Attending: Urology | Admitting: Urology

## 2017-04-02 DIAGNOSIS — Z833 Family history of diabetes mellitus: Secondary | ICD-10-CM | POA: Insufficient documentation

## 2017-04-02 DIAGNOSIS — E785 Hyperlipidemia, unspecified: Secondary | ICD-10-CM | POA: Insufficient documentation

## 2017-04-02 DIAGNOSIS — R310 Gross hematuria: Secondary | ICD-10-CM | POA: Insufficient documentation

## 2017-04-02 DIAGNOSIS — I480 Paroxysmal atrial fibrillation: Secondary | ICD-10-CM | POA: Insufficient documentation

## 2017-04-02 DIAGNOSIS — Z87891 Personal history of nicotine dependence: Secondary | ICD-10-CM | POA: Diagnosis not present

## 2017-04-02 DIAGNOSIS — I499 Cardiac arrhythmia, unspecified: Secondary | ICD-10-CM | POA: Diagnosis not present

## 2017-04-02 DIAGNOSIS — D494 Neoplasm of unspecified behavior of bladder: Secondary | ICD-10-CM

## 2017-04-02 DIAGNOSIS — K219 Gastro-esophageal reflux disease without esophagitis: Secondary | ICD-10-CM | POA: Diagnosis not present

## 2017-04-02 DIAGNOSIS — N189 Chronic kidney disease, unspecified: Secondary | ICD-10-CM | POA: Diagnosis not present

## 2017-04-02 DIAGNOSIS — C679 Malignant neoplasm of bladder, unspecified: Secondary | ICD-10-CM | POA: Diagnosis present

## 2017-04-02 DIAGNOSIS — C672 Malignant neoplasm of lateral wall of bladder: Secondary | ICD-10-CM | POA: Diagnosis not present

## 2017-04-02 DIAGNOSIS — R7303 Prediabetes: Secondary | ICD-10-CM | POA: Diagnosis not present

## 2017-04-02 DIAGNOSIS — Z8042 Family history of malignant neoplasm of prostate: Secondary | ICD-10-CM | POA: Insufficient documentation

## 2017-04-02 DIAGNOSIS — Z8249 Family history of ischemic heart disease and other diseases of the circulatory system: Secondary | ICD-10-CM | POA: Insufficient documentation

## 2017-04-02 DIAGNOSIS — Z823 Family history of stroke: Secondary | ICD-10-CM | POA: Diagnosis not present

## 2017-04-02 DIAGNOSIS — E7800 Pure hypercholesterolemia, unspecified: Secondary | ICD-10-CM | POA: Insufficient documentation

## 2017-04-02 DIAGNOSIS — I129 Hypertensive chronic kidney disease with stage 1 through stage 4 chronic kidney disease, or unspecified chronic kidney disease: Secondary | ICD-10-CM | POA: Insufficient documentation

## 2017-04-02 HISTORY — PX: TRANSURETHRAL RESECTION OF BLADDER TUMOR: SHX2575

## 2017-04-02 LAB — TYPE AND SCREEN
ABO/RH(D): O POS
Antibody Screen: NEGATIVE

## 2017-04-02 SURGERY — TURBT (TRANSURETHRAL RESECTION OF BLADDER TUMOR)
Anesthesia: General | Site: Bladder | Laterality: Right | Wound class: Clean Contaminated

## 2017-04-02 MED ORDER — FENTANYL CITRATE (PF) 100 MCG/2ML IJ SOLN
25.0000 ug | INTRAMUSCULAR | Status: DC | PRN
  Administered 2017-04-02 (×4): 25 ug via INTRAVENOUS

## 2017-04-02 MED ORDER — PHENYLEPHRINE HCL 10 MG/ML IJ SOLN
INTRAMUSCULAR | Status: DC | PRN
  Administered 2017-04-02 (×2): 200 ug via INTRAVENOUS
  Administered 2017-04-02 (×2): 100 ug via INTRAVENOUS

## 2017-04-02 MED ORDER — MORPHINE SULFATE (PF) 2 MG/ML IV SOLN: 2.0000 mg | INTRAVENOUS | Status: DC | PRN

## 2017-04-02 MED ORDER — PROPOFOL 10 MG/ML IV BOLUS
INTRAVENOUS | Status: DC | PRN
Start: 2017-04-02 — End: 2017-04-02
  Administered 2017-04-02: 150 mg via INTRAVENOUS

## 2017-04-02 MED ORDER — FENTANYL CITRATE (PF) 100 MCG/2ML IJ SOLN
INTRAMUSCULAR | Status: DC | PRN
  Administered 2017-04-02: 100 ug via INTRAVENOUS
  Administered 2017-04-02 (×4): 25 ug via INTRAVENOUS

## 2017-04-02 MED ORDER — LACTATED RINGERS IV SOLN
INTRAVENOUS | Status: DC
  Administered 2017-04-02: 09:00:00 via INTRAVENOUS

## 2017-04-02 MED ORDER — DIPHENHYDRAMINE HCL 50 MG/ML IJ SOLN: 12.5000 mg | Freq: Four times a day (QID) | INTRAMUSCULAR | Status: DC | PRN

## 2017-04-02 MED ORDER — MITOMYCIN CHEMO FOR BLADDER INSTILLATION 40 MG
40.0000 mg | Freq: Once | INTRAVENOUS | Status: DC
  Filled 2017-04-02: qty 40

## 2017-04-02 MED ORDER — FAMOTIDINE 20 MG PO TABS: 10.0000 mg | ORAL_TABLET | Freq: Every day | ORAL | Status: DC | PRN

## 2017-04-02 MED ORDER — BELLADONNA ALKALOIDS-OPIUM 16.2-60 MG RE SUPP
1.0000 | Freq: Four times a day (QID) | RECTAL | Status: DC | PRN
  Administered 2017-04-02: 1 via RECTAL
  Filled 2017-04-02: qty 1

## 2017-04-02 MED ORDER — PROPOFOL 10 MG/ML IV BOLUS
INTRAVENOUS | Status: AC
  Filled 2017-04-02: qty 20

## 2017-04-02 MED ORDER — ACETAMINOPHEN 325 MG PO TABS: 650.0000 mg | ORAL_TABLET | ORAL | Status: DC | PRN

## 2017-04-02 MED ORDER — CEFAZOLIN IN D5W 1 GM/50ML IV SOLN
1.0000 g | Freq: Three times a day (TID) | INTRAVENOUS | Status: AC
  Administered 2017-04-02 (×2): 1 g via INTRAVENOUS
  Filled 2017-04-02 (×2): qty 50

## 2017-04-02 MED ORDER — DIPHENHYDRAMINE HCL 12.5 MG/5ML PO ELIX: 12.5000 mg | ORAL_SOLUTION | Freq: Four times a day (QID) | ORAL | Status: DC | PRN

## 2017-04-02 MED ORDER — FENTANYL CITRATE (PF) 100 MCG/2ML IJ SOLN
INTRAMUSCULAR | Status: AC
  Filled 2017-04-02: qty 2

## 2017-04-02 MED ORDER — OXYCODONE-ACETAMINOPHEN 5-325 MG PO TABS
1.0000 | ORAL_TABLET | ORAL | Status: DC | PRN
  Administered 2017-04-02 – 2017-04-03 (×2): 1 via ORAL
  Filled 2017-04-02 (×2): qty 1

## 2017-04-02 MED ORDER — ONDANSETRON HCL 4 MG/2ML IJ SOLN: 4.0000 mg | INTRAMUSCULAR | Status: DC | PRN

## 2017-04-02 MED ORDER — LIDOCAINE HCL (CARDIAC) 20 MG/ML IV SOLN
INTRAVENOUS | Status: DC | PRN
  Administered 2017-04-02: 30 mg via INTRAVENOUS

## 2017-04-02 MED ORDER — MIDAZOLAM HCL 2 MG/2ML IJ SOLN
INTRAMUSCULAR | Status: DC | PRN
  Administered 2017-04-02: 2 mg via INTRAVENOUS

## 2017-04-02 MED ORDER — DOCUSATE SODIUM 100 MG PO CAPS
100.0000 mg | ORAL_CAPSULE | Freq: Two times a day (BID) | ORAL | Status: DC
  Filled 2017-04-02: qty 1

## 2017-04-02 MED ORDER — DEXAMETHASONE SODIUM PHOSPHATE 10 MG/ML IJ SOLN
INTRAMUSCULAR | Status: DC | PRN
  Administered 2017-04-02: 5 mg via INTRAVENOUS

## 2017-04-02 MED ORDER — MIDAZOLAM HCL 2 MG/2ML IJ SOLN
INTRAMUSCULAR | Status: AC
  Filled 2017-04-02: qty 2

## 2017-04-02 MED ORDER — METOPROLOL SUCCINATE ER 50 MG PO TB24
50.0000 mg | ORAL_TABLET | Freq: Every evening | ORAL | Status: DC
  Administered 2017-04-02: 50 mg via ORAL
  Filled 2017-04-02: qty 1

## 2017-04-02 MED ORDER — PANTOPRAZOLE SODIUM 40 MG PO TBEC: 40.0000 mg | DELAYED_RELEASE_TABLET | Freq: Every day | ORAL | Status: DC

## 2017-04-02 MED ORDER — IOTHALAMATE MEGLUMINE 43 % IV SOLN
INTRAVENOUS | Status: DC | PRN
  Administered 2017-04-02: 10 mL

## 2017-04-02 MED ORDER — FENTANYL CITRATE (PF) 100 MCG/2ML IJ SOLN
INTRAMUSCULAR | Status: AC
  Administered 2017-04-02: 25 ug via INTRAVENOUS
  Filled 2017-04-02: qty 2

## 2017-04-02 MED ORDER — GLYCOPYRROLATE 0.2 MG/ML IJ SOLN
INTRAMUSCULAR | Status: DC | PRN
  Administered 2017-04-02: .15 mg via INTRAVENOUS

## 2017-04-02 MED ORDER — SODIUM CHLORIDE 0.9 % IV SOLN
INTRAVENOUS | Status: DC
  Administered 2017-04-02 – 2017-04-03 (×3): via INTRAVENOUS

## 2017-04-02 MED ORDER — OXYMETAZOLINE HCL 0.05 % NA SOLN
1.0000 | Freq: Every evening | NASAL | Status: DC | PRN
  Filled 2017-04-02: qty 15

## 2017-04-02 MED ORDER — ONDANSETRON HCL 4 MG/2ML IJ SOLN: 4.0000 mg | Freq: Once | INTRAMUSCULAR | Status: DC | PRN

## 2017-04-02 MED ORDER — OXYBUTYNIN CHLORIDE 5 MG PO TABS
5.0000 mg | ORAL_TABLET | Freq: Three times a day (TID) | ORAL | Status: DC | PRN
  Administered 2017-04-02: 5 mg via ORAL
  Filled 2017-04-02: qty 1

## 2017-04-02 MED ORDER — ONDANSETRON HCL 4 MG/2ML IJ SOLN
INTRAMUSCULAR | Status: DC | PRN
  Administered 2017-04-02: 4 mg via INTRAVENOUS

## 2017-04-02 MED ORDER — HYDROCHLOROTHIAZIDE 25 MG PO TABS
25.0000 mg | ORAL_TABLET | Freq: Every day | ORAL | Status: DC
  Administered 2017-04-02: 25 mg via ORAL
  Filled 2017-04-02: qty 1

## 2017-04-02 MED ORDER — CEFAZOLIN SODIUM-DEXTROSE 2-4 GM/100ML-% IV SOLN
INTRAVENOUS | Status: AC
  Filled 2017-04-02: qty 100

## 2017-04-02 SURGICAL SUPPLY — 39 items
BAG DRAIN CYSTO-URO LG1000N (MISCELLANEOUS) ×5 IMPLANT
BAG URO DRAIN 2000ML W/SPOUT (MISCELLANEOUS) ×5 IMPLANT
CATH FOLEY 2WAY  5CC 16FR (CATHETERS)
CATH FOLEY 3WAY 30CC 24FR (CATHETERS) ×2
CATH URETL 5X70 OPEN END (CATHETERS) ×5 IMPLANT
CATH URTH 16FR FL 2W BLN LF (CATHETERS) IMPLANT
CATH URTH STD 24FR FL 3W 2 (CATHETERS) ×3 IMPLANT
CONRAY 43 FOR UROLOGY 50M (MISCELLANEOUS) ×5 IMPLANT
DRAPE UTILITY 15X26 TOWEL STRL (DRAPES) ×5 IMPLANT
DRSG TELFA 4X3 1S NADH ST (GAUZE/BANDAGES/DRESSINGS) ×5 IMPLANT
ELECT BIVAP BIPO 22/24 DONUT (ELECTROSURGICAL) ×5
ELECT LOOP 22F BIPOLAR SML (ELECTROSURGICAL) ×5
ELECT REM PT RETURN 9FT ADLT (ELECTROSURGICAL)
ELECTRD BIVAP BIPO 22/24 DONUT (ELECTROSURGICAL) ×3 IMPLANT
ELECTRODE LOOP 22F BIPOLAR SML (ELECTROSURGICAL) ×3 IMPLANT
ELECTRODE REM PT RTRN 9FT ADLT (ELECTROSURGICAL) IMPLANT
GLOVE BIO SURGEON STRL SZ 6.5 (GLOVE) ×8 IMPLANT
GLOVE BIO SURGEONS STRL SZ 6.5 (GLOVE) ×2
GOWN STRL REUS W/ TWL LRG LVL3 (GOWN DISPOSABLE) ×6 IMPLANT
GOWN STRL REUS W/TWL LRG LVL3 (GOWN DISPOSABLE) ×4
HOLDER FOLEY CATH W/STRAP (MISCELLANEOUS) ×5 IMPLANT
KIT RM TURNOVER CYSTO AR (KITS) ×5 IMPLANT
LOOP CUT BIPOLAR 24F LRG (ELECTROSURGICAL) IMPLANT
NDL SAFETY ECLIPSE 18X1.5 (NEEDLE) IMPLANT
NEEDLE HYPO 18GX1.5 SHARP (NEEDLE)
PACK CYSTO AR (MISCELLANEOUS) ×5 IMPLANT
SCRUB POVIDONE IODINE 4 OZ (MISCELLANEOUS) ×5 IMPLANT
SENSORWIRE 0.038 NOT ANGLED (WIRE) ×5
SET CYSTO W/LG BORE CLAMP LF (SET/KITS/TRAYS/PACK) IMPLANT
SET IRRIG Y TYPE TUR BLADDER L (SET/KITS/TRAYS/PACK) ×5 IMPLANT
SET IRRIGATING DISP (SET/KITS/TRAYS/PACK) ×10 IMPLANT
SOL .9 NS 3000ML IRR  AL (IV SOLUTION) ×44
SOL .9 NS 3000ML IRR UROMATIC (IV SOLUTION) ×66 IMPLANT
STENT URET 6FRX24 CONTOUR (STENTS) IMPLANT
STENT URET 6FRX26 CONTOUR (STENTS) IMPLANT
SURGILUBE 2OZ TUBE FLIPTOP (MISCELLANEOUS) ×5 IMPLANT
SYRINGE IRR TOOMEY STRL 70CC (SYRINGE) ×5 IMPLANT
WATER STERILE IRR 1000ML POUR (IV SOLUTION) ×5 IMPLANT
WIRE SENSOR 0.038 NOT ANGLED (WIRE) ×3 IMPLANT

## 2017-04-02 NOTE — Op Note (Signed)
Date of procedure: 04/02/17  Preoperative diagnosis:  1. Bladder tumor 2. Gross hematuria   Postoperative diagnosis:  1. same   Procedure: 1. TURBT, large >5 cm  Surgeon: Hollice Espy, MD  Anesthesia: General  Complications: None  Intraoperative findings:   EBL: 150 cc  Specimens: bladder tumor  Drains: 24 Fr three-way Foley catheter on CBI  Indication: Randy Wheeler is a 71 y.o. patient with gross hematuria found to have a large bladder mass measuring 5.6 x 4.2 x 4.8 cm on CT scan arising from the right posterior bladder wall.  After reviewing the management options for treatment, he elected to proceed with the above surgical procedure(s). We have discussed the potential benefits and risks of the procedure, side effects of the proposed treatment, the likelihood of the patient achieving the goals of the procedure, and any potential problems that might occur during the procedure or recuperation. Informed consent has been obtained.  Description of procedure:  The patient was taken to the operating room and general anesthesia was induced.  The patient was placed in the dorsal lithotomy position, prepped and draped in the usual sterile fashion, and preoperative antibiotics were administered. A preoperative time-out was performed.   A 26 French resectoscope was introduced using a blunt angled obturator without difficulty into the bladder. Careful special the bladder at this time with results of large at least 5 x 5 cm bladder mass with papillary surface rising from the right lateral wall of the bladder occupying a good portion of his overall bladder capacity. There was significant neovascularity appreciated on the adjacent mucosa. The right ureteral orifice was noted to be very close to the tumor but not involving the tumor. There is a small satellite lesion adjacent to the UO but again not involving the actual orifice. Aside from this very large tumor, there were no other obvious  tumors throughout the bladder.  At this point time, using the bipolar resectoscope and saline as a medium, I started to resect the very large tumor. Once the majority of the tumor was taken down, the tumor chips were evacuated from the bladder using an Ellik.  Resection occurred over greater than a two hour period given the extent of the tumor.  Of note, the surface of the tumor was papillary but the majority of of the bulk of the tumor was solid in nature. It was able to resect the tumor mostly down to the base but there was some residual tumor both at the base and along the periphery of the tumor. Unfortunately, visualization became somewhat poor and the decision was made to return for staged procedure as needed pending pathology results, although highly suspicious for muscle invasive tumor. In order to achieve adequate hemostasis near the end of the case, I did use both the button and the bipolar rollerball to fulgurate the remaining tumor bed. Once adequate hemostasis was achieved, the residual bladder chips were evacuated from the bladder. The bladder was then drained. A 24 French three-way Foley catheter was placed in the balloon was filled with 30 cc of sterile water. Slow drip CBI was initiated.  At the end of the case, rectal exam was performed. This revealed an enlarged prostate about nodules. The bladder was mobile and nontender. There is a total tumor was nonpalpable.  He was then cleaned and dried, repositioned the supine position, reversed from anesthesia, taken to the PACU in stable condition.  Plan: He'll be admitted overnight for slow drip CBI.  Hollice Espy, M.D.

## 2017-04-02 NOTE — Anesthesia Post-op Follow-up Note (Cosign Needed)
Anesthesia QCDR form completed.        

## 2017-04-02 NOTE — Transfer of Care (Signed)
Immediate Anesthesia Transfer of Care Note  Patient: Randy Wheeler  Procedure(s) Performed: Procedure(s): TRANSURETHRAL RESECTION OF BLADDER TUMOR (TURBT) (N/A)  Patient Location: PACU  Anesthesia Type:General  Level of Consciousness: awake, alert  and oriented  Airway & Oxygen Therapy: Patient Spontanous Breathing and Patient connected to face mask oxygen  Post-op Assessment: Report given to RN, Post -op Vital signs reviewed and stable and Patient moving all extremities X 4  Post vital signs: Reviewed and stable  Last Vitals:  Vitals:   04/02/17 0813 04/02/17 1251  BP: 122/85   Pulse: 90   Resp: 20   Temp: 36.7 C 36.5 C    Last Pain:  Vitals:   04/02/17 1251  TempSrc: Temporal         Complications: No apparent anesthesia complications

## 2017-04-02 NOTE — H&P (View-Only) (Signed)
03/15/2017 2:16 PM   Randy Wheeler [birth year] [id number]  Referring provider: Dion Body, MD [place] [facility],  [address]  Chief Complaint  Patient presents with  . Follow-up    Discuss Surgery    HPI: 72 year old male who returned today for follow-up CT urogram. This was obtained in the setting of episodic gross hematuria many months. 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  There is no associated pelvic lymphadenopathy for upper tract filling defects appreciated.   In addition, a 2 cm hypoattenuating lesion in the pancreas was identified incidentally.  He does have personal history of Afib on Eliquis.   He stopped taking this under the direction of his PCP on his gross hematuria progressed.  No accociated dysuria, UTIs, or flank pain.     At baseline, he does have some post void dribbling, nocturia x 1.  Stream is decent.  No urgency or frequency.      He is a remote smoker, 20 years x 1 ppd but quit 1998.   Findings were previously reviewed by telephone with the patient has additional questions today. He is also accompanied by his wife.   PMH: Past Medical History:  Diagnosis Date  . Cancer [facility])    Bladder  . Chronic kidney disease   . Dysrhythmia   . GERD (gastroesophageal reflux disease)   . Hyperlipidemia   . Hypertension   . Paroxysmal atrial fibrillation (HCC)   . Pre-diabetes    diet controlled  . Sexual dysfunction     Surgical History: Past Surgical History:  Procedure Laterality Date  . APPENDECTOMY    . COLONOSCOPY WITH PROPOFOL N/A 10/25/2015   Procedure: COLONOSCOPY WITH PROPOFOL;  Surgeon: Manya Silvas, MD;  Location: [facility] ENDOSCOPY;  Service: Endoscopy;  Laterality: N/A;    Home Medications:  Allergies as of 03/15/2017   No Known Allergies     Medication List       Accurate as of 03/15/17 11:59 PM. Always  use your most recent med list.          ELIQUIS 5 MG Tabs tablet Generic drug:  apixaban TAKE ONE TABLET TWICE DAILY   famotidine 10 MG chewable tablet Commonly known as:  PEPCID AC Chew 10 mg by mouth daily as needed for heartburn.   hydrochlorothiazide 25 MG tablet Commonly known as:  HYDRODIURIL Take 25 mg by mouth daily.   metoprolol succinate 50 MG 24 hr tablet Commonly known as:  TOPROL-XL Take 50 mg by mouth every evening. Take with or immediately following a meal.   omeprazole 20 MG capsule Commonly known as:  PRILOSEC Take 20 mg by mouth daily as needed (heartburn).   oxymetazoline 0.05 % nasal spray Commonly known as:  AFRIN Place 1 spray into left nostril at bedtime as needed for congestion.       Allergies: No Known Allergies  Family History: Family History  Problem Relation Age of Onset  . Prostate cancer Father   . Stroke Father   . Atrial fibrillation Father   . Diabetes type II Father   . Kidney cancer Neg Hx     Social History:  reports that he quit smoking about 19 years ago. His smoking use included Cigarettes. He smoked 1.00 pack per day. He has never used smokeless tobacco. He reports that he drinks alcohol. He reports that he does not use drugs.  ROS: UROLOGY Frequent Urination?:  No Hard to postpone urination?: No Burning/pain with urination?: No Get up at night to urinate?: No Leakage of urine?: No Urine stream starts and stops?: No Trouble starting stream?: No Do you have to strain to urinate?: No Blood in urine?: No Urinary tract infection?: No Sexually transmitted disease?: No Injury to kidneys or bladder?: No Painful intercourse?: No Weak stream?: No Erection problems?: No Penile pain?: No  Gastrointestinal Nausea?: No Vomiting?: No Indigestion/heartburn?: No Diarrhea?: No Constipation?: No  Constitutional Fever: No Night sweats?: No Weight loss?: No Fatigue?: No  Skin Skin rash/lesions?: No Itching?:  No  Eyes Blurred vision?: No Double vision?: No  Ears/Nose/Throat Sore throat?: No Sinus problems?: No  Hematologic/Lymphatic Swollen glands?: No Easy bruising?: No  Cardiovascular Leg swelling?: No Chest pain?: No  Respiratory Cough?: No Shortness of breath?: No  Endocrine Excessive thirst?: No  Musculoskeletal Back pain?: No Joint pain?: No  Neurological Headaches?: No Dizziness?: No  Psychologic Depression?: No Anxiety?: No  Physical Exam: BP 114/76 (BP Location: Left Arm, Patient Position: Sitting, Cuff Size: Normal)   Pulse 86   Ht 6' (1.829 m)   Wt 211 lb 6.4 oz (95.9 kg)   BMI 28.67 kg/m   Constitutional:  Alert and oriented, No acute distress.  Accompanied by wife today. HEENT: [place] AT, moist mucus membranes.  Trachea midline, no masses. Cardiovascular: No clubbing, cyanosis, or edema. Respiratory: Normal respiratory effort, no increased work of breathing. GI: Abdomen is soft, nontender, nondistended, no abdominal masses GU: No CVA tenderness.  Skin: No rashes, bruises or suspicious lesions. Neurologic: Grossly intact, no focal deficits, moving all 4 extremities. Psychiatric: Normal mood and affect.  Laboratory Data:  Lab Results  Component Value Date   CREATININE 0.84 03/15/2017   Urinalysis Pending preop  Pertinent Imaging: CLINICAL DATA:  Two week history of gross hematuria.  EXAM: CT ABDOMEN AND PELVIS WITHOUT AND WITH CONTRAST  TECHNIQUE: Multidetector CT imaging of the abdomen and pelvis was performed following the standard protocol before and following the bolus administration of intravenous contrast.  CONTRAST:  123mL ISOVUE-300 IOPAMIDOL (ISOVUE-300) INJECTION 61%  COMPARISON:  None.  FINDINGS: Lower chest: Calcified granuloma identified right lung base. Otherwise unremarkable.  Hepatobiliary: The liver shows diffusely decreased attenuation suggesting steatosis. 2 cm cyst identified lateral segment left liver.  Other smaller adjacent cysts noted. Tiny hypoattenuating lesion posterior right liver is too small to characterize. No enhancing mass within the liver parenchyma. There is no evidence for gallstones, gallbladder wall thickening, or pericholecystic fluid. No intrahepatic or extrahepatic biliary dilation.  Pancreas: Exophytic 1.9 x 1.5 cm low-density lesions identified at the uncinate process of the pancreas, along the cranial wall of the transverse duodenum (see image 36 series 4 and image 45 series 10). No dilatation main pancreatic duct.  Spleen: No splenomegaly. No focal mass lesion.  Adrenals/Urinary Tract: No adrenal nodule or mass.  Precontrast imaging shows no stones in either kidney. No ureteral or bladder stones.  Imaging after IV contrast administration shows no enhancing lesion in either kidney. 4.2 cm simple cyst identified upper pole left kidney.  Delayed imaging shows no abnormality of either intrarenal collecting system or renal pelvis. Both ureters are well opacified and show no focal dilatation, wall thickening, or intraluminal filling defect.  Delayed imaging through the bladder reveals any 4.2 x 5.6 x 4.8 cm polypoid mass lesion with irregular margins arising from the posterior right bladder wall.  Stomach/Bowel: Stomach is nondistended. No gastric wall thickening. No evidence of outlet obstruction. Duodenum is normally  positioned as is the ligament of Treitz. No small bowel wall thickening. No small bowel dilatation. The terminal ileum is normal. Nonvisualization appendix compatible with prior appendectomy. Diverticuli are seen scattered along the entire length of the colon without CT findings of diverticulitis.  Vascular/Lymphatic: There is abdominal aortic atherosclerosis without aneurysm. There is no gastrohepatic or hepatoduodenal ligament lymphadenopathy. No intraperitoneal or retroperitoneal lymphadenopathy. No pelvic sidewall  lymphadenopathy.  Reproductive: Prostate gland upper normal for size.  Other: No intraperitoneal free fluid.  Musculoskeletal: Bone windows reveal no worrisome lytic or sclerotic osseous lesions.  IMPRESSION: 1. 5.6 cm polypoid mass arises from the posterior right bladder wall, consistent with urothelial neoplasm. 2. 2 cm exophytic hypoattenuating lesion identified along the uncinate process of the pancreas. Dedicated abdominal MRI without and with contrast may prove helpful to further evaluate. 3.  Abdominal Aortic Atherosclerois (ICD10-170.0) These results will be called to the ordering clinician or representative by the Radiologist Assistant, and communication documented in the PACS or zVision Dashboard.   Electronically Signed   By: Misty Stanley M.D.   On: 03/09/2017 16:48  CT scan personally reviewed today with the patient.  Assessment & Plan:    1. Bladder mass 5.6 cm right posterior wall bladder lesion highly suspicious for TCC.  There is no evidence of extravesical extension or lymphadenopathy.  Options discussed with patient including expediting office cystoscopy versus proceeding directly to the operating room for TURBT were discussed. He is most interested in proceeding with surgery as soon as possible. He will be on vacation the first week of April will like to have this done as soon as he returns.  Risk and benefits of the procedure were discussed at length. Given the location of the tumor, it may or may not involve the right ureteral orifice. As such, we discussed the possibility of placing a right ureteral stent if deemed necessary. Risks including bleeding, infection, damage to this running structures, bladder injury, need for further treatment were all reviewed. All of his questions were answered.  2. Gross hematuria Likely secondary to #2  3. Pancreatic lesion In addition, he does have a lesion on his pancreas which requires further workup. MRI  ordered and this was discussed with the patient as well.   Plan to proceed with TURBT, possible right ureteral stent placement, instillation of mitomycin.   Hollice Espy, MD  [facility] [address], [place], [place] [address] [phone]  I spent 25 min with this patient of which greater than 50% was spent in counseling and coordination of care with the patient.

## 2017-04-02 NOTE — Interval H&P Note (Signed)
History and Physical Interval Note:  04/02/2017 9:16 AM  Randy Wheeler  has presented today for surgery, with the diagnosis of BLADDER TUMOR,GROSS HEMATURIA  The various methods of treatment have been discussed with the patient and family. After consideration of risks, benefits and other options for treatment, the patient has consented to  Procedure(s): TRANSURETHRAL RESECTION OF BLADDER TUMOR WITH MITOMYCIN-C / >5CM (N/A) CYSTOSCOPY WITH STENT PLACEMENT (Right) as a surgical intervention .  The patient's history has been reviewed, patient examined, no change in status, stable for surgery.  I have reviewed the patient's chart and labs.  Questions were answered to the patient's satisfaction.    RRR CTAB  Hollice Espy

## 2017-04-02 NOTE — Anesthesia Procedure Notes (Signed)
Procedure Name: LMA Insertion Date/Time: 04/02/2017 9:50 AM Performed by: Kennon Holter Pre-anesthesia Checklist: Patient identified, Patient being monitored, Timeout performed, Emergency Drugs available and Suction available Patient Re-evaluated:Patient Re-evaluated prior to inductionOxygen Delivery Method: Circle system utilized Preoxygenation: Pre-oxygenation with 100% oxygen Intubation Type: IV induction Ventilation: Mask ventilation without difficulty LMA: LMA inserted LMA Size: 4.5 Tube type: Oral Number of attempts: 1 Placement Confirmation: positive ETCO2 and breath sounds checked- equal and bilateral Tube secured with: Tape Dental Injury: Teeth and Oropharynx as per pre-operative assessment

## 2017-04-02 NOTE — Anesthesia Preprocedure Evaluation (Signed)
Anesthesia Evaluation  Patient identified by MRN, date of birth, ID band Patient awake    Reviewed: Allergy & Precautions, H&P , NPO status , Patient's Chart, lab work & pertinent test results, reviewed documented beta blocker date and time   Airway Mallampati: II  TM Distance: >3 FB Neck ROM: full    Dental  (+) Teeth Intact   Pulmonary neg pulmonary ROS, former smoker,    Pulmonary exam normal        Cardiovascular Exercise Tolerance: Good hypertension, On Medications negative cardio ROS Normal cardiovascular exam+ dysrhythmias Atrial Fibrillation  Rate:Normal     Neuro/Psych negative neurological ROS  negative psych ROS   GI/Hepatic negative GI ROS, Neg liver ROS, GERD  Medicated,  Endo/Other  negative endocrine ROS  Renal/GU negative Renal ROS  negative genitourinary   Musculoskeletal   Abdominal   Peds  Hematology negative hematology ROS (+)   Anesthesia Other Findings   Reproductive/Obstetrics negative OB ROS                             Anesthesia Physical Anesthesia Plan  ASA: III  Anesthesia Plan: General LMA   Post-op Pain Management:    Induction:   Airway Management Planned:   Additional Equipment:   Intra-op Plan:   Post-operative Plan:   Informed Consent: I have reviewed the patients History and Physical, chart, labs and discussed the procedure including the risks, benefits and alternatives for the proposed anesthesia with the patient or authorized representative who has indicated his/her understanding and acceptance.     Plan Discussed with: CRNA  Anesthesia Plan Comments:         Anesthesia Quick Evaluation

## 2017-04-03 ENCOUNTER — Telehealth: Payer: Self-pay | Admitting: Urology

## 2017-04-03 ENCOUNTER — Telehealth: Payer: Self-pay

## 2017-04-03 ENCOUNTER — Encounter: Payer: Self-pay | Admitting: Urology

## 2017-04-03 DIAGNOSIS — D494 Neoplasm of unspecified behavior of bladder: Secondary | ICD-10-CM | POA: Diagnosis not present

## 2017-04-03 DIAGNOSIS — C672 Malignant neoplasm of lateral wall of bladder: Secondary | ICD-10-CM | POA: Diagnosis not present

## 2017-04-03 LAB — BASIC METABOLIC PANEL
ANION GAP: 7 (ref 5–15)
BUN: 17 mg/dL (ref 6–20)
CALCIUM: 8.4 mg/dL — AB (ref 8.9–10.3)
CO2: 28 mmol/L (ref 22–32)
CREATININE: 0.84 mg/dL (ref 0.61–1.24)
Chloride: 101 mmol/L (ref 101–111)
GFR calc Af Amer: >60 mL/min (ref >60)
GFR calc non Af Amer: >60 mL/min (ref >60)
GLUCOSE: 133 mg/dL — AB (ref 65–99)
Potassium: 3.4 mmol/L — ABNORMAL LOW (ref 3.5–5.1)
Sodium: 136 mmol/L (ref 135–145)

## 2017-04-03 LAB — CBC
HCT: 38.1 % — ABNORMAL LOW (ref 40.0–52.0)
HEMOGLOBIN: 13.2 g/dL (ref 13.0–18.0)
MCH: 31.9 pg (ref 26.0–34.0)
MCHC: 34.6 g/dL (ref 32.0–36.0)
MCV: 92.3 fL (ref 80.0–100.0)
Platelets: 217 10*3/uL (ref 150–440)
RBC: 4.12 MIL/uL — AB (ref 4.40–5.90)
RDW: 14.1 % (ref 11.5–14.5)
WBC: 7 10*3/uL (ref 3.8–10.6)

## 2017-04-03 LAB — SURGICAL PATHOLOGY

## 2017-04-03 MED ORDER — OXYBUTYNIN CHLORIDE 5 MG PO TABS: 5.0000 mg | ORAL_TABLET | Freq: Three times a day (TID) | ORAL | 0 refills | Status: DC | PRN

## 2017-04-03 MED ORDER — DOCUSATE SODIUM 100 MG PO CAPS: 100.0000 mg | ORAL_CAPSULE | Freq: Two times a day (BID) | ORAL | 0 refills | Status: DC

## 2017-04-03 MED ORDER — HYDROCODONE-ACETAMINOPHEN 5-325 MG PO TABS: 1.0000 | ORAL_TABLET | Freq: Four times a day (QID) | ORAL | 0 refills | Status: DC | PRN

## 2017-04-03 NOTE — Telephone Encounter (Signed)
done

## 2017-04-03 NOTE — Telephone Encounter (Signed)
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 per Radiology [facility] .

## 2017-04-03 NOTE — Telephone Encounter (Signed)
-----   Message from Hollice Espy, MD sent at 04/03/2017  9:34 AM EDT ----- Regarding: nurse visit voiding trial THursday This patient already has a follow-up appointment with me next week to review pathology.  He does need a nurse visit on Thursday morning for a voiding trial.  Hollice Espy, MD

## 2017-04-03 NOTE — Progress Notes (Signed)
Pt d/c to home today.  Foley to remain intact.  Foley education completed with teach back.  Pt and wife educated on foley care with confirmation of understanding.  IV removed intact. Rx's given to pt w/all questions and concerns addressed.  D/C paperwork reviewed and education provided with all questions and concerns addressed. Pt wife at bedside for home transport.

## 2017-04-03 NOTE — Discharge Summary (Addendum)
Date of admission: 04/02/2017  Date of discharge: 04/03/2017  Admission diagnosis: Bladder mass, gross hematuria  Discharge diagnosis: same  Secondary diagnoses:  Patient Active Problem List   Diagnosis Date Noted  . Bladder tumor 04/02/2017  . Vaccine counseling 05/23/2016  . 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 Atrial fibrillation ([place]) 06/12/2014  . Essential hypertension 06/12/2014  . Gastroesophageal reflux disease without esophagitis 06/12/2014  . Polyp of colon, hyperplastic 06/12/2014  . Pure hypercholesterolemia 06/12/2014  . Sexual dysfunction 06/12/2014    History and Physical: For full details, please see admission history and physical. Briefly, Randy Wheeler is a 71 y.o. year old patient with large bladder tumor s/p TURBT.   Hospital Course: Patient tolerated the procedure well.  He was then transferred to the floor after an uneventful PACU stay.  His hospital course was uncomplicated .  He was admitted overnight for CBI. This was able to be titrated off on the morning of postop day 1.  On POD#1 he had met discharge criteria: was eating a regular diet, was up and ambulating independently,  pain was well controlled,  and was ready to for discharge.  He was discharged home with his Foley catheter in place.  Physical Exam  Constitutional: He is oriented to person, place, and time. He appears well-developed.  HENT:  Head: Normocephalic and atraumatic.  Neck: Normal range of motion.  Cardiovascular: Normal rate.   Pulmonary/Chest: Effort normal.  Abdominal: Soft. He exhibits no distension.  Genitourinary:  Genitourinary Comments: 70 French Foley catheter draining light pink urine with CBI off.  Neurological: He is alert and oriented to person, place, and time.  Skin: Skin is warm and dry.  Vitals reviewed. Blood pressure 97/67, pulse 82, temperature 98.2 F (36.8 C), temperature source Oral, resp. rate 19, height 6' (1.829 m), weight 219 lb 6.4 oz (99.5 kg),  SpO2 95 %.   Laboratory values:   Recent Labs  04/03/17 0508  WBC 7.0  HGB 13.2  HCT 38.1*    Recent Labs  04/03/17 0508  NA 136  K 3.4*  CL 101  CO2 28  GLUCOSE 133*  BUN 17  CREATININE 0.84  CALCIUM 8.4*   No results for input(s): LABPT, INR in the last 72 hours. No results for input(s): LABURIN in the last 72 hours. Results for orders placed or performed during the hospital encounter of 03/15/17  Urine culture     Status: None   Collection Time: 03/15/17  1:41 PM  Result Value Ref Range Status   Specimen Description URINE, CLEAN CATCH  Final   Special Requests NONE  Final   Culture   Final    NO GROWTH Performed at [facility], [place] [address], [place], [place] [address]    Report Status 03/16/2017 FINAL  Final    Disposition: Home  Discharge instruction: Transurethral Resection of Bladder Tumor (TURBT) or Bladder Biopsy   Definition:  Transurethral Resection of the Bladder Tumor is a surgical procedure used to diagnose and remove tumors within the bladder. TURBT is the most common treatment for early stage bladder cancer.  General instructions:     Your recent bladder surgery requires very little post hospital care but some definite precautions.  Despite the fact that no skin incisions were used, the area around the bladder incisions are raw and covered with scabs to promote healing and prevent bleeding. Certain precautions are needed to insure that the scabs are not disturbed over the next 2-4 weeks while the  healing proceeds.  Because the raw surface inside your bladder and the irritating effects of urine you may expect frequency of urination and/or urgency (a stronger desire to urinate) and perhaps even getting up at night more often. This will usually resolve or improve slowly over the healing period. You may see some blood in your urine over the first 6 weeks. Do not be alarmed, even if the urine was clear for a while. Get off your feet and drink  lots of fluids until clearing occurs. If you start to pass clots or don't improve call us.  Diet:  You may return to your normal diet immediately. Because of the raw surface of your bladder, alcohol, spicy foods, foods high in acid and drinks with caffeine may cause irritation or frequency and should be used in moderation. To keep your urine flowing freely and avoid constipation, drink plenty of fluids during the day (8-10 glasses). Tip: Avoid cranberry juice because it is very acidic.  Activity:  Your physical activity doesn't need to be restricted. However, if you are very active, you may see some blood in the urine. We suggest that you reduce your activity under the circumstances until the bleeding has stopped.  Bowels:  It is important to keep your bowels regular during the postoperative period. Straining with bowel movements can cause bleeding. A bowel movement every other day is reasonable. Use a mild laxative if needed, such as milk of magnesia 2-3 tablespoons, or 2 Dulcolax tablets. Call if you continue to have problems. If you had been taking narcotics for pain, before, during or after your surgery, you may be constipated. Take a laxative if necessary.    Medication:  You should resume your pre-surgery medications unless told not to. In addition you may be given an antibiotic to prevent or treat infection. Antibiotics are not always necessary. All medication should be taken as prescribed until the bottles are finished unless you are having an unusual reaction to one of the drugs.   [place], [place] [address] [phone]     Discharge medications: Allergies as of 04/03/2017   No Known Allergies     Medication List    STOP taking these medications   ELIQUIS 5 MG Tabs tablet Generic drug:  apixaban     TAKE these medications   docusate sodium 100 MG capsule Commonly known as:  COLACE Take 1 capsule (100 mg total) by mouth 2 (two) times  daily.   famotidine 10 MG chewable tablet Commonly known as:  PEPCID AC Chew 10 mg by mouth daily as needed for heartburn.   hydrochlorothiazide 25 MG tablet Commonly known as:  HYDRODIURIL Take 25 mg by mouth daily.   HYDROcodone-acetaminophen 5-325 MG tablet Commonly known as:  NORCO/VICODIN Take 1-2 tablets by mouth every 6 (six) hours as needed for moderate pain.   metoprolol succinate 50 MG 24 hr tablet Commonly known as:  TOPROL-XL Take 50 mg by mouth every evening. Take with or immediately following a meal.   omeprazole 20 MG capsule Commonly known as:  PRILOSEC Take 20 mg by mouth daily as needed (heartburn).   oxybutynin 5 MG tablet Commonly known as:  DITROPAN Take 1 tablet (5 mg total) by mouth every 8 (eight) hours as needed for bladder spasms.   oxymetazoline 0.05 % nasal spray Commonly known as:  AFRIN Place 1 spray into left nostril at bedtime as needed for congestion.       Followup:  Follow-up Information  Hollice Espy, MD.   Specialty:  Urology Why:  as scheduled Contact information: [address] [place] [address] [phone]

## 2017-04-03 NOTE — Anesthesia Postprocedure Evaluation (Signed)
Anesthesia Post Note  Patient: Randy Wheeler  Procedure(s) Performed: Procedure(s) (LRB): TRANSURETHRAL RESECTION OF BLADDER TUMOR (TURBT) (N/A)  Patient location during evaluation: PACU Anesthesia Type: General Level of consciousness: awake and alert Pain management: pain level controlled Vital Signs Assessment: post-procedure vital signs reviewed and stable Respiratory status: spontaneous breathing, nonlabored ventilation, respiratory function stable and patient connected to nasal cannula oxygen Cardiovascular status: blood pressure returned to baseline and stable Postop Assessment: no signs of nausea or vomiting Anesthetic complications: no     Last Vitals:  Vitals:   04/02/17 2334 04/03/17 0509  BP: 108/66 97/67  Pulse: 78 82  Resp: 20 19  Temp: 36.4 C 36.8 C    Last Pain:  Vitals:   04/03/17 0509  TempSrc: Oral  PainSc:                  Molli Barrows

## 2017-04-03 NOTE — Discharge Instructions (Signed)
Transurethral Resection of Bladder Tumor (TURBT) or Bladder Biopsy   Definition:  Transurethral Resection of the Bladder Tumor is a surgical procedure used to diagnose and remove tumors within the bladder. TURBT is the most common treatment for early stage bladder cancer.  General instructions:     Your recent bladder surgery requires very little post hospital care but some definite precautions.  Despite the fact that no skin incisions were used, the area around the bladder incisions are raw and covered with scabs to promote healing and prevent bleeding. Certain precautions are needed to insure that the scabs are not disturbed over the next 2-4 weeks while the healing proceeds.  Because the raw surface inside your bladder and the irritating effects of urine you may expect frequency of urination and/or urgency (a stronger desire to urinate) and perhaps even getting up at night more often. This will usually resolve or improve slowly over the healing period. You may see some blood in your urine over the first 6 weeks. Do not be alarmed, even if the urine was clear for a while. Get off your feet and drink lots of fluids until clearing occurs. If you start to pass clots or don't improve call us.  Diet:  You may return to your normal diet immediately. Because of the raw surface of your bladder, alcohol, spicy foods, foods high in acid and drinks with caffeine may cause irritation or frequency and should be used in moderation. To keep your urine flowing freely and avoid constipation, drink plenty of fluids during the day (8-10 glasses). Tip: Avoid cranberry juice because it is very acidic.  Activity:  Your physical activity doesn't need to be restricted. However, if you are very active, you may see some blood in the urine. We suggest that you reduce your activity under the circumstances until the bleeding has stopped.  Bowels:  It is important to keep your bowels regular during the postoperative  period. Straining with bowel movements can cause bleeding. A bowel movement every other day is reasonable. Use a mild laxative if needed, such as milk of magnesia 2-3 tablespoons, or 2 Dulcolax tablets. Call if you continue to have problems. If you had been taking narcotics for pain, before, during or after your surgery, you may be constipated. Take a laxative if necessary.    Medication:  You should resume your pre-surgery medications unless told not to. In addition you may be given an antibiotic to prevent or treat infection. Antibiotics are not always necessary. All medication should be taken as prescribed until the bottles are finished unless you are having an unusual reaction to one of the drugs.   [facility] [address] [place], [place] [address] [phone]     Indwelling Urinary Catheter Care, Adult Take good care of your catheter to keep it working and to prevent problems. How to wear your catheter Attach your catheter to your leg with tape (adhesive tape) or a leg strap. Make sure it is not too tight. If you use tape, remove any bits of tape that are already on the catheter. How to wear a drainage bag You should have:  A large overnight bag.  A small leg bag. Overnight Bag  You may wear the overnight bag at any time. Always keep the bag below the level of your bladder but off the floor. When you sleep, put a clean plastic bag in a wastebasket. Then hang the bag inside the wastebasket. Leg Bag  Never wear the  leg bag at night. Always wear the leg bag below your knee. Keep the leg bag secure with a leg strap or tape. How to care for your skin  Clean the skin around the catheter at least once every day.  Shower every day. Do not take baths.  Put creams, lotions, or ointments on your genital area only as told by your doctor.  Do not use powders, sprays, or lotions on your genital area. How to clean your catheter and your  skin 1. Wash your hands with soap and water. 2. Wet a washcloth in warm water and gentle (mild) soap. 3. Use the washcloth to clean the skin where the catheter enters your body. Clean downward and wipe away from the catheter in small circles. Do not wipe toward the catheter. 4. Pat the area dry with a clean towel. Make sure to clean off all soap. How to care for your drainage bags Empty your drainage bag when it is ?- full or at least 2-3 times a day. Replace your drainage bag once a month or sooner if it starts to smell bad or look dirty. Do not clean your drainage bag unless told by your doctor. Emptying a drainage bag   Supplies Needed  Rubbing alcohol.  Gauze pad or cotton ball.  Tape or a leg strap. Steps 1. Wash your hands with soap and water. 2. Separate (detach) the bag from your leg. 3. Hold the bag over the toilet or a clean container. Keep the bag below your hips and bladder. This stops pee (urine) from going back into the tube. 4. Open the pour spout at the bottom of the bag. 5. Empty the pee into the toilet or container. Do not let the pour spout touch any surface. 6. Put rubbing alcohol on a gauze pad or cotton ball. 7. Use the gauze pad or cotton ball to clean the pour spout. 8. Close the pour spout. 9. Attach the bag to your leg with tape or a leg strap. 10. Wash your hands. Changing a drainage bag  Supplies Needed  Alcohol wipes.  A clean drainage bag.  Adhesive tape or a leg strap. Steps 1. Wash your hands with soap and water. 2. Separate the dirty bag from your leg. 3. Pinch the rubber catheter with your fingers so that pee does not spill out. 4. Separate the catheter tube from the drainage tube where these tubes connect (at the connection valve). Do not let the tubes touch any surface. 5. Clean the end of the catheter tube with an alcohol wipe. Use a different alcohol wipe to clean the end of the drainage tube. 6. Connect the catheter tube to the drainage  tube of the clean bag. 7. Attach the new bag to the leg with adhesive tape or a leg strap. 8. Wash your hands. How to prevent infection and other problems  Never pull on your catheter or try to remove it. Pulling can damage tissue in your body.  Always wash your hands before and after touching your catheter.  If a leg strap gets wet, replace it with a dry one.  Drink enough fluids to keep your pee clear or pale yellow, or as told by your doctor.  Do not let the drainage bag or tubing touch the floor.  Wear cotton underwear.  If you are male, wipe from front to back after you poop (have a bowel movement).  Check on the catheter often to make sure it works and the tubing is  not twisted. Get help if:  Your pee is cloudy.  Your pee smells unusually bad.  Your pee is not draining into the bag.  Your tube gets clogged.  Your catheter starts to leak.  Your bladder feels full. Get help right away if:  You have redness, swelling, or pain where the catheter enters your body.  You have fluid, pus, or a bad smell coming from the area where the catheter enters your body.  The area where the catheter enters your body feels warm.  You have a fever.  You have pain in your:  Stomach (abdomen).  Legs.  Lower back.  Bladder.  You see blood fill the catheter.  Your pee is pink or red.  You feel sick to your stomach (nauseous).  You throw up (vomit).  You have chills.  Your catheter gets pulled out. This information is not intended to replace advice given to you by your health care provider. Make sure you discuss any questions you have with your health care provider. Document Released: 03/31/2013 Document Revised: 11/01/2016 Document Reviewed: 05/19/2014 Elsevier Interactive Patient Education  2017 [employer].

## 2017-04-03 NOTE — Care Management Obs Status (Signed)
[facility] NOTIFICATION   Patient Details  Name: SEKOU ZUCKERMAN MRN: [id number] Date of Birth: 05-Nov-1946   Medicare Observation Status Notification Given:  Yes    Jolly Mango, RN 04/03/2017, 9:55 AM

## 2017-04-05 ENCOUNTER — Ambulatory Visit (INDEPENDENT_AMBULATORY_CARE_PROVIDER_SITE_OTHER): Payer: Medicare Other

## 2017-04-05 ENCOUNTER — Other Ambulatory Visit: Payer: Medicare Other

## 2017-04-05 DIAGNOSIS — N3289 Other specified disorders of bladder: Secondary | ICD-10-CM | POA: Diagnosis not present

## 2017-04-05 NOTE — Progress Notes (Signed)
Fill and Pull Catheter Removal  Patient is present today for a catheter removal.  Patient was cleaned and prepped in a sterile fashion 258ml of sterile water/ saline was instilled into the bladder when the patient felt the urge to urinate. 10ml of water was then drained from the balloon.  A 24FR foley cath was removed from the bladder no complications were noted .  Patient as then given some time to void on their own.  Patient can void  268ml on their own after some time.  Patient tolerated well.  Preformed by: Toniann Fail, LPN   Follow up/ Additional notes: pt has f/u visit next Friday with [name].

## 2017-04-06 NOTE — Telephone Encounter (Signed)
Patient is scheduled for 04-10-17 @ 9:00 he has been notified.  Thanks, Sharyn Lull

## 2017-04-06 NOTE — Progress Notes (Signed)
Case was reviewed at tumor board on 04/05/2017. 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  Radiological imaging was reviewed, there is suspicious bony lesion on the anterior aspect of T12 which is suspicious. In retrospect, this can also be appreciated on CT scan. 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 He is unsure which level. I recommended proceeding with a bone scan prior to her follow-up next week in order to rule out metastatic disease.  We will also go ahead and refer him to the cancer center for possible neoadjuvant chemotherapy versus palliative chemotherapy depending on presence or absence of metastatic disease.  Hollice Espy, MD

## 2017-04-10 ENCOUNTER — Encounter
Admission: RE | Admit: 2017-04-10 | Discharge: 2017-04-10 | Disposition: A | Discharge status: Home / Self Care | Payer: Medicare Other | Source: Ambulatory Visit | Attending: Urology | Admitting: Urology

## 2017-04-10 DIAGNOSIS — C672 Malignant neoplasm of lateral wall of bladder: Secondary | ICD-10-CM | POA: Diagnosis present

## 2017-04-10 MED ORDER — TECHNETIUM TC 99M MEDRONATE IV KIT
25.0000 | PACK | Freq: Once | INTRAVENOUS | Status: AC | PRN
  Administered 2017-04-10: 22.93 via INTRAVENOUS

## 2017-04-13 ENCOUNTER — Encounter: Payer: Self-pay | Admitting: Urology

## 2017-04-13 ENCOUNTER — Ambulatory Visit (INDEPENDENT_AMBULATORY_CARE_PROVIDER_SITE_OTHER): Payer: Medicare Other | Admitting: Urology

## 2017-04-13 VITALS — BP 120/71 | HR 101 | Ht 72.0 in | Wt 206.0 lb

## 2017-04-13 DIAGNOSIS — C672 Malignant neoplasm of lateral wall of bladder: Secondary | ICD-10-CM | POA: Diagnosis not present

## 2017-04-13 LAB — MICROSCOPIC EXAMINATION: Epithelial Cells (non renal): NONE SEEN /hpf (ref 0–10)

## 2017-04-13 LAB — URINALYSIS, COMPLETE
Bilirubin, UA: NEGATIVE
Glucose, UA: NEGATIVE
Ketones, UA: NEGATIVE
Nitrite, UA: NEGATIVE
Specific Gravity, UA: <1.005 — ABNORMAL LOW (ref 1.005–1.030)
Urobilinogen, Ur: 0.2 mg/dL (ref 0.2–1.0)
pH, UA: 6 (ref 5.0–7.5)

## 2017-04-13 MED ORDER — CIPROFLOXACIN HCL 500 MG PO TABS: 500.0000 mg | ORAL_TABLET | Freq: Once | ORAL | Status: DC

## 2017-04-13 MED ORDER — LIDOCAINE HCL 2 % EX GEL: 1.0000 "application " | Freq: Once | CUTANEOUS | Status: DC

## 2017-04-13 NOTE — Progress Notes (Signed)
04/13/2017 10:02 AM   Randy Wheeler 1946-02-02 [id number]  Referring provider: Dion Body, MD [facility] [place], [place] [address]  Chief Complaint  Patient presents with  . Routine Post Op    path results    HPI: 71 year old male with newly diagnosed muscle invasive bladder cancer who returns to the office following completion of his staging to discuss further management. He initially presented to the office with episodes of gross hematuria found to have a very large of 5.6 cm bladder mass arising from the right posterior lateral bladder wall.  He is taken to the operating room on 04/02/2017 for TURBT.  He underwent extensive TURBT at which time the tumor was incompletely resected due to both high suspicion for muscle invasive disease as well as large tumor burden. He remained overnight in the hospital the Foley catheter and subsequently underwent a successful voiding trial as an outpatient.  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    Radiological imaging was reviewed, there is suspicious bony lesion on the anterior aspect of T12 which is suspicious. In retrospect, this can also be appreciated on CT scan. 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 He is unsure which level.  Follow-up bone scan shows no metabolically active disease at this level.  He did have an incidental cystic lesion measured during 1.8 cm in the pancreas which was followed up with an MR and thought to be likely benign. Follow-up MRI in 12 months was recommended.  Today, he reports that he is doing well. His urine is clearing nicely. He would like to know when he can restart his anticoagulation. He denies significant urinary urgency or frequency. He has minimal pain.  He is accompanied today by his wife. His overall functional status is excellent.   PMH: Past Medical  History:  Diagnosis Date  . Cancer [facility])    Bladder  . Chronic kidney disease   . Dysrhythmia   . GERD (gastroesophageal reflux disease)   . Hyperlipidemia   . Hypertension   . Paroxysmal atrial fibrillation (HCC)   . Pre-diabetes    diet controlled  . Sexual dysfunction     Surgical History: Past Surgical History:  Procedure Laterality Date  . APPENDECTOMY    . COLONOSCOPY WITH PROPOFOL N/A 10/25/2015   Procedure: COLONOSCOPY WITH PROPOFOL;  Surgeon: Manya Silvas, MD;  Location: [facility] END[facility]COPY;  Service: Endoscopy;  Laterality: N/A;  . TRANSURETHRAL RESECTION OF BLADDER TUMOR N/A 04/02/2017   Procedure: TRANSURETHRAL RESECTION OF BLADDER TUMOR (TURBT);  Surgeon: Hollice Espy, MD;  Location: [facility] ORS;  Service: Urology;  Laterality: N/A;    Home Medications:  Allergies as of 04/13/2017   No Known Allergies     Medication List       Accurate as of 04/13/17 10:02 AM. Always use your most recent med list.          docusate sodium 100 MG capsule Commonly known as:  COLACE Take 1 capsule (100 mg total) by mouth 2 (two) times daily.   famotidine 10 MG chewable tablet Commonly known as:  PEPCID AC Chew 10 mg by mouth daily as needed for heartburn.   hydrochlorothiazide 25 MG tablet Commonly known as:  HYDRODIURIL Take 25 mg by mouth daily.   metoprolol succinate 50 MG 24 hr tablet Commonly known as:  TOPROL-XL Take 50 mg by mouth every  evening. Take with or immediately following a meal.   omeprazole 20 MG capsule Commonly known as:  PRILOSEC Take 20 mg by mouth daily as needed (heartburn).   oxymetazoline 0.05 % nasal spray Commonly known as:  AFRIN Place 1 spray into left nostril at bedtime as needed for congestion.       Allergies: No Known Allergies  Family History: Family History  Problem Relation Age of Onset  . Prostate cancer Father   . Stroke Father   . Atrial fibrillation Father   . Diabetes type II Father   . Kidney cancer Neg Hx      Social History:  reports that he quit smoking about 19 years ago. His smoking use included Cigarettes. He smoked 1.00 pack per day. He has never used smokeless tobacco. He reports that he drinks alcohol. He reports that he does not use drugs.  ROS: UROLOGY Frequent Urination?: No Hard to postpone urination?: No Burning/pain with urination?: Yes Get up at night to urinate?: No Leakage of urine?: No Urine stream starts and stops?: No Trouble starting stream?: No Do you have to strain to urinate?: No Blood in urine?: No Urinary tract infection?: No Sexually transmitted disease?: No Injury to kidneys or bladder?: No Painful intercourse?: No Weak stream?: No Erection problems?: No Penile pain?: No  Gastrointestinal Nausea?: No Vomiting?: No Indigestion/heartburn?: No Diarrhea?: No Constipation?: No  Constitutional Fever: No Night sweats?: No Weight loss?: No Fatigue?: No  Skin Skin rash/lesions?: No Itching?: No  Eyes Blurred vision?: No Double vision?: No  Ears/Nose/Throat Sore throat?: No Sinus problems?: No  Hematologic/Lymphatic Swollen glands?: No Easy bruising?: No  Cardiovascular Leg swelling?: No Chest pain?: No  Respiratory Cough?: No Shortness of breath?: No  Endocrine Excessive thirst?: No  Musculoskeletal Back pain?: No Joint pain?: No  Neurological Headaches?: No Dizziness?: No  Psychologic Depression?: No Anxiety?: No  Physical Exam: BP 120/71   Pulse (!) 101   Ht 6' (1.829 m)   Wt 206 lb (93.4 kg)   BMI 27.94 kg/m   Constitutional:  Alert and oriented, No acute distress. HEENT: [place] AT, moist mucus membranes.  Trachea midline, no masses. Cardiovascular: No clubbing, cyanosis, or edema. Respiratory: Normal respiratory effort, no increased work of breathing. GI: Abdomen is soft, nontender, nondistended, no abdominal masses GU: No CVA tenderness.  Skin: No rashes, bruises or suspicious lesions. Neurologic: Grossly  intact, no focal deficits, moving all 4 extremities. Psychiatric: Normal mood and affect.  Laboratory Data: Lab Results  Component Value Date   WBC 7.0 04/03/2017   HGB 13.2 04/03/2017   HCT 38.1 (L) 04/03/2017   MCV 92.3 04/03/2017   PLT 217 04/03/2017    Lab Results  Component Value Date   CREATININE 0.84 04/03/2017    Urinalysis n/a  Pertinent Imaging: CLINICAL DATA:  History of bladder carcinoma with bone lesion at T12 noted by MR.  EXAM: NUCLEAR MEDICINE WHOLE BODY BONE SCAN  TECHNIQUE: Whole body anterior and posterior images were obtained approximately 3 hours after intravenous injection of radiopharmaceutical.  RADIOPHARMACEUTICALS:  22.9 mCi Technetium-63m MDP IV  COMPARISON:  CT abdomen pelvis of 03/09/2017, and MRI abdomen of 03/30/2017  FINDINGS: The area in question at T12 by MRI of the abdomen does not show increased uptake of radionuclide by total body bone scan. There is some activity noted in the lower lumbar spine particularly to the right of midline which correlates with degenerative change involving the facet joints. No intense focus of activity is seen to indicate  metastatic involvement of the skeleton.  IMPRESSION: 1. No abnormality is seen in the region of T12 to suggest a bone metastasis when compared to the prior MRI images. 2. Negative total body bone scan other than probable degenerative change of the facet joints of the lower lumbar spine.   Electronically Signed   By: Ivar Drape M.D.   On: 04/10/2017 14:12  Bone scan was personally reviewed today. Additionally, his CT abdomen and pelvis with and without contrast from 03/09/2017 was reviewed.  Assessment & Plan:    1. Malignant neoplasm of urinary bladder, unspecified site [facility]) Pathology results were reviewed again in detail today with the patient. Based on his staging thus far, he has at least T2 disease without any obvious lymphadenopathy or metastatic  disease. Based on his functional status and overall health, feel that he is an excellent candidate for neoadjuvant chemotherapy followed by radical cystoprostatectomy. Chest x-ray and LFTs ordered today to complete staging He is been referred to medical oncology for initiation of neoadjuvant chemotherapy and I will refer him to Dr. Phebe Colla in [place] for consideration of radical robotic cystoprostatectomy.  All of his questions were answered today. Questions about chemotherapy were primarily deferred to the oncologist. I did have a lengthy discussion today about the preoperative, intraoperative, and postoperative course as it relates to cystoprostatectomy. We reviewed the anatomy of the ileal conduit versus neobladder and is more interested in a conduit.  Hollice Espy, MD  [facility] [address] [place], [place] [address] [phone]  I spent 40 min with this patient of which greater than 50% was spent in counseling and coordination of care with the patient.

## 2017-04-16 ENCOUNTER — Other Ambulatory Visit: Payer: Medicare Other

## 2017-04-16 ENCOUNTER — Telehealth: Payer: Self-pay | Admitting: Urology

## 2017-04-16 ENCOUNTER — Ambulatory Visit
Admission: RE | Admit: 2017-04-16 | Discharge: 2017-04-16 | Disposition: A | Discharge status: Home / Self Care | Payer: Medicare Other | Source: Ambulatory Visit | Attending: Urology | Admitting: Urology

## 2017-04-16 DIAGNOSIS — C672 Malignant neoplasm of lateral wall of bladder: Secondary | ICD-10-CM

## 2017-04-16 NOTE — Addendum Note (Signed)
Addended by: Kyra Manges on: 04/16/2017 02:02 PM   Modules accepted: Orders

## 2017-04-16 NOTE — Telephone Encounter (Signed)
Faxed referral over to [facility] to [name] for patient to have a consult per [name].  Ref her notes from [facility] 04-13-17  michelle

## 2017-04-17 ENCOUNTER — Telehealth: Payer: Self-pay

## 2017-04-17 LAB — HEPATIC FUNCTION PANEL
ALBUMIN: 4 g/dL (ref 3.6–4.8)
ALT: 39 IU/L (ref 0–44)
AST: 39 IU/L (ref 0–40)
Alkaline Phosphatase: 37 IU/L — ABNORMAL LOW (ref 39–117)
BILIRUBIN TOTAL: 0.4 mg/dL (ref 0.0–1.2)
BILIRUBIN, DIRECT: 0.08 mg/dL (ref 0.00–0.40)
TOTAL PROTEIN: 7.6 g/dL (ref 6.0–8.5)

## 2017-04-17 NOTE — Telephone Encounter (Signed)
-----   Message from Hollice Espy, MD sent at 04/17/2017  9:13 AM EDT ----- Liver labs look great!  Hollice Espy, MD

## 2017-04-17 NOTE — Telephone Encounter (Signed)
-----   Message from Hollice Espy, MD sent at 04/16/2017  4:19 PM EDT ----- CXR looks good.    Hollice Espy, MD

## 2017-04-17 NOTE — Telephone Encounter (Signed)
Spoke with pt in reference to cxr and lab results. Pt voiced understanding.

## 2017-04-23 ENCOUNTER — Encounter: Payer: Self-pay | Admitting: Oncology

## 2017-04-23 ENCOUNTER — Telehealth: Payer: Self-pay | Admitting: *Deleted

## 2017-04-23 ENCOUNTER — Inpatient Hospital Stay: Payer: Medicare Other | Attending: Oncology | Admitting: Oncology

## 2017-04-23 VITALS — BP 134/80 | HR 81 | Temp 98.3°F | Ht 72.0 in | Wt 214.6 lb

## 2017-04-23 DIAGNOSIS — R319 Hematuria, unspecified: Secondary | ICD-10-CM | POA: Insufficient documentation

## 2017-04-23 DIAGNOSIS — Z7189 Other specified counseling: Secondary | ICD-10-CM | POA: Insufficient documentation

## 2017-04-23 DIAGNOSIS — E876 Hypokalemia: Secondary | ICD-10-CM | POA: Diagnosis not present

## 2017-04-23 DIAGNOSIS — R7303 Prediabetes: Secondary | ICD-10-CM | POA: Insufficient documentation

## 2017-04-23 DIAGNOSIS — R5383 Other fatigue: Secondary | ICD-10-CM

## 2017-04-23 DIAGNOSIS — I10 Essential (primary) hypertension: Secondary | ICD-10-CM | POA: Insufficient documentation

## 2017-04-23 DIAGNOSIS — C674 Malignant neoplasm of posterior wall of bladder: Secondary | ICD-10-CM | POA: Insufficient documentation

## 2017-04-23 DIAGNOSIS — Z5111 Encounter for antineoplastic chemotherapy: Secondary | ICD-10-CM | POA: Diagnosis not present

## 2017-04-23 DIAGNOSIS — Z87891 Personal history of nicotine dependence: Secondary | ICD-10-CM | POA: Diagnosis not present

## 2017-04-23 DIAGNOSIS — K219 Gastro-esophageal reflux disease without esophagitis: Secondary | ICD-10-CM | POA: Diagnosis not present

## 2017-04-23 DIAGNOSIS — Z8042 Family history of malignant neoplasm of prostate: Secondary | ICD-10-CM

## 2017-04-23 DIAGNOSIS — E785 Hyperlipidemia, unspecified: Secondary | ICD-10-CM | POA: Insufficient documentation

## 2017-04-23 DIAGNOSIS — I480 Paroxysmal atrial fibrillation: Secondary | ICD-10-CM

## 2017-04-23 DIAGNOSIS — Z7982 Long term (current) use of aspirin: Secondary | ICD-10-CM | POA: Insufficient documentation

## 2017-04-23 DIAGNOSIS — Z79899 Other long term (current) drug therapy: Secondary | ICD-10-CM | POA: Insufficient documentation

## 2017-04-23 DIAGNOSIS — R5381 Other malaise: Secondary | ICD-10-CM | POA: Diagnosis not present

## 2017-04-23 MED ORDER — DEXAMETHASONE 4 MG PO TABS: ORAL_TABLET | ORAL | 1 refills | Status: DC

## 2017-04-23 MED ORDER — LORAZEPAM 0.5 MG PO TABS: 0.5000 mg | ORAL_TABLET | Freq: Four times a day (QID) | ORAL | 0 refills | Status: DC | PRN

## 2017-04-23 MED ORDER — PROCHLORPERAZINE MALEATE 10 MG PO TABS: 10.0000 mg | ORAL_TABLET | Freq: Four times a day (QID) | ORAL | 1 refills | Status: DC | PRN

## 2017-04-23 MED ORDER — LIDOCAINE-PRILOCAINE 2.5-2.5 % EX CREA: TOPICAL_CREAM | CUTANEOUS | 3 refills | Status: DC

## 2017-04-23 MED ORDER — ONDANSETRON HCL 8 MG PO TABS: 8.0000 mg | ORAL_TABLET | Freq: Two times a day (BID) | ORAL | 1 refills | Status: DC | PRN

## 2017-04-23 NOTE — Progress Notes (Signed)
START ON PATHWAY REGIMEN - Bladder     A cycle is every 21 days:     Gemcitabine      Cisplatin   **Always confirm dose/schedule in your pharmacy ordering system**  Patient Characteristics: Pre Cystectomy, Clinical T2-T4a, N0-1, M0, Cystectomy Eligible, Cisplatin-Based Chemotherapy Indicated (CrCl ? 50 mL/min and Minimal or No Symptoms) AJCC M Category: M0 AJCC N Category: N0 AJCC T Category: T2 Current evidence of distant metastases<= No AJCC 8 Stage Grouping: II Intent of Therapy: Curative Intent, Discussed with Patient 

## 2017-04-23 NOTE — Progress Notes (Signed)
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  Date of visit: 04/23/17   History of presenting illness- 1. Patient is a 71 year old male with a past medical history significant for atrial fibrillation for which he was on Eliquis in the past. In August 2017 patient had minor trauma to his abdomen while bone pain and then left hematuria or immediately following that which lasted for about a couple of days. Patient thought that this was likely secondary to trauma . However continued to have intermittent hematuria since then and then had a second major episode of bleeding in March 2018. He spoke to his primary care doctor who suggested to stop Eliquis at this time. However bleeding continued for 2-3 days after stopping Eliquis and hence he was referred to urology [name]  2. Ct abdomen on 03/09/17 showed: IMPRESSION: 1. 5.6 cm polypoid mass arises from the posterior right bladder wall, consistent with urothelial neoplasm. 2. 2 cm exophytic hypoattenuating lesion identified along the uncinate process of the pancreas. Dedicated abdominal MRI without and with contrast may prove helpful to further evaluate.  3. MRI abdomen on 03/30/17 showed: . 1.8 cm simple appearing cystic lesion exophytic from the uncinate process of the pancreas is assisted with other scattered tiny pancreatic parenchymal cyst. Repeat MRI in 12 months is recommended. This recommendation follows ACR consensus guidelines: Management of Incidental Pancreatic Cysts: A White Paper of the ACR Incidental Findings Committee. J Am Coll Radiol 3710;62:694-854. 2. 1 cm enhancing focus  identified in the anterior aspect of the T12 vertebral body. Imaging features are not suggestive of cavernous hemangioma. Given the history of bladder cancer, metastatic involvement cannot be excluded. Bone scan may prove helpful to further evaluate.  4. Bone scan on 04/10/17 showed: IMPRESSION: 1. No abnormality is seen in the region of T12 to suggest a bone metastasis when compared to the prior MRI images. 2. Negative total body bone scan other than probable degenerative change of the facet joints of the lower lumbar spine.  5. CXr was negative for metastatic disease. Patient underwent TURBT on 04/02/17 which showed: DIAGNOSIS:  A. BLADDER TUMOR; TURBT:  - PAPILLARY UROTHELIAL CARCINOMA, INVADING MUSCULARIS PROPRIA,  HIGH-GRADE (WHO/ISUP).  6. Case was discussed at Tumor board and patient was thought to have atleast T2 cN0 muscle invasive bladder cancer. He has been sent to [place] for neoadjuvant chemo recommendations  7. Patient feels a little fatigued and has intermittent hematuria. He remains off eliquis. Denies other complaints  ECOG PS- 0  Pain scale- 0   Review of systems- Review of Systems  Constitutional: Positive for malaise/fatigue. Negative for chills, fever and weight loss.  HENT: Negative for congestion, ear discharge and nosebleeds.   Eyes: Negative for blurred vision.  Respiratory: Negative for cough, hemoptysis, sputum production, shortness of breath and wheezing.   Cardiovascular: Negative for chest pain, palpitations, orthopnea and claudication.  Gastrointestinal: Negative for abdominal pain, blood in stool, constipation, diarrhea, heartburn, melena, nausea and vomiting.  Genitourinary: Positive for hematuria. Negative for dysuria, flank pain, frequency and urgency.  Musculoskeletal: Negative for back pain, joint pain and myalgias.  Skin: Negative for rash.  Neurological: Negative for dizziness, tingling, focal weakness, seizures, weakness and headaches.  Endo/Heme/Allergies: Does not bruise/bleed easily.  Psychiatric/Behavioral: Negative for depression and suicidal ideas. The patient does not have insomnia.     No Known Allergies  Patient Active Problem List   Diagnosis Date Noted  . Bladder tumor 04/02/2017  . Vaccine counseling 05/23/2016  . 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 Atrial fibrillation ([place]) 06/12/2014  . Essential hypertension 06/12/2014  . Gastroesophageal reflux disease without esophagitis 06/12/2014  . Polyp of colon, hyperplastic 06/12/2014  . Pure hypercholesterolemia 06/12/2014  . Sexual dysfunction 06/12/2014     Past Medical History:  Diagnosis Date  . Cancer [facility])    Bladder  . Chronic kidney disease   . Dysrhythmia   . GERD (gastroesophageal reflux disease)   . Hyperlipidemia   . Hypertension   . Paroxysmal atrial fibrillation (HCC)   . Pre-diabetes    diet controlled  . Sexual dysfunction      Past Surgical History:  Procedure Laterality Date  . APPENDECTOMY    . COLONOSCOPY WITH PROPOFOL N/A 10/25/2015   Procedure: COLONOSCOPY WITH PROPOFOL;  Surgeon: Manya Silvas, MD;  Location: [facility] ENDOSCOPY;  Service: Endoscopy;  Laterality: N/A;  . TRANSURETHRAL RESECTION OF BLADDER TUMOR N/A 04/02/2017   Procedure: TRANSURETHRAL RESECTION OF BLADDER TUMOR (TURBT);  Surgeon: Hollice Espy, MD;  Location: [facility] ORS;  Service: Urology;  Laterality: N/A;    Social History   Social History  . Marital status: Married    Spouse name: N/A  . Number of children: N/A  . Years of education: N/A   Occupational History  . Not on file.   Social History Main Topics  . Smoking status: Former Smoker    Packs/day: 1.00    Types: Cigarettes    Quit date: 10/21/1997  . Smokeless tobacco: Never Used  . Alcohol use Yes     Comment: 1-2 glasses of wine daily  . Drug use: No  . Sexual activity: Not on file   Other Topics Concern  . Not on file   Social History Narrative  . No narrative on file      Family History  Problem Relation Age of Onset  . Prostate cancer Father   . Stroke Father   . Atrial fibrillation Father   . Diabetes type II Father   . Kidney cancer Neg Hx      Current Outpatient Prescriptions:  .  famotidine (PEPCID AC) 10 MG chewable tablet, Chew 10 mg by mouth daily as needed for heartburn., Disp: , Rfl:  .  hydrochlorothiazide (HYDRODIURIL) 25 MG tablet, Take 25 mg by mouth daily., Disp: , Rfl:  .  metoprolol succinate (TOPROL-XL) 50 MG 24 hr tablet, Take 50 mg by mouth every evening. Take with or immediately following a meal. , Disp: , Rfl:  .  omeprazole (PRILOSEC) 20 MG capsule, Take 20 mg by mouth daily as needed (heartburn). , Disp: , Rfl:  .  oxymetazoline (AFRIN) 0.05 % nasal spray, Place 1 spray into left nostril at bedtime as needed for congestion., Disp: , Rfl:    Physical exam:  Vitals:   04/23/17 0903  BP: 134/80  Pulse: 81  Temp: 98.3 F (36.8 C)  TempSrc: Tympanic  Weight: 214 lb 9.9 oz (97.3 kg)  Height: 6' (1.829 m)   Physical Exam  Constitutional: He is oriented to person, place, and time and well-developed, well-nourished, and in no distress.  HENT:  Head: Normocephalic and atraumatic.  Eyes: EOM are normal. Pupils are equal, round, and reactive to light.  Neck: Normal  range of motion.  Cardiovascular: Normal rate and normal heart sounds.   HR irregular  Pulmonary/Chest: Effort normal and breath sounds normal.  Abdominal: Soft. Bowel sounds are normal.  Neurological: He is alert and oriented to person, place, and time.  Skin: Skin is warm and dry.       CMP Latest Ref Rng & Units 04/16/2017  Glucose 65 - 99 mg/dL -  BUN 6 - 20 mg/dL -  Creatinine 0.61 - 1.24 mg/dL -  Sodium 135 - 145 mmol/L -  Potassium 3.5 - 5.1 mmol/L -  Chloride 101 - 111 mmol/L -  CO2 22 - 32 mmol/L -  Calcium 8.9 - 10.3 mg/dL -  Total Protein 6.0 - 8.5 g/dL 7.6  Total Bilirubin 0.0 - 1.2 mg/dL 0.4  Alkaline Phos 39 - 117 IU/L 37(L)  AST 0 -  40 IU/L 39  ALT 0 - 44 IU/L 39   CBC Latest Ref Rng & Units 04/03/2017  WBC 3.8 - 10.6 K/uL 7.0  Hemoglobin 13.0 - 18.0 g/dL 13.2  Hematocrit 40.0 - 52.0 % 38.1(L)  Platelets 150 - 440 K/uL 217    No images are attached to the encounter.  Chest 1 View  Result Date: 04/16/2017 CLINICAL DATA:  71 year old with new diagnosis of invasive bladder cancer. EXAM: CHEST 1 VIEW COMPARISON:  Visualized lung bases on CT abdomen and pelvis 03/09/2017. No prior chest imaging. FINDINGS: Cardiac silhouette normal in size. Thoracic aorta mildly atherosclerotic. Hilar and mediastinal contours otherwise unremarkable. Lungs clear. Bronchovascular markings normal. Pulmonary vascularity normal. No visible pleural effusions. No pneumothorax. IMPRESSION: No acute cardiopulmonary disease. No evidence of metastatic disease. Electronically Signed   By: Evangeline Dakin M.D.   On: 04/16/2017 15:09   Mr Abdomen W Wo Contrast  Result Date: 03/30/2017 CLINICAL DATA:  Pancreatic lesions seen on recent CT scan. EXAM: MRI ABDOMEN WITHOUT AND WITH CONTRAST TECHNIQUE: Multiplanar multisequence MR imaging of the abdomen was performed both before and after the administration of intravenous contrast. CONTRAST:  51mL MULTIHANCE GADOBENATE DIMEGLUMINE 529 MG/ML IV SOLN COMPARISON:  CT scan 03/09/2017 FINDINGS: Lower chest:  Unremarkable. Hepatobiliary: Multiple cysts ranging in size from several mm up to 2.2 cm. There is no evidence for gallstones, gallbladder wall thickening, or pericholecystic fluid. No intrahepatic or extrahepatic biliary dilation. Pancreas: Cystic lesion uncinate process measures 1.8 cm today. This has simple MR imaging features and is associated with numerous other very tiny cystic foci scattered in the pancreatic head and tail. No dilatation the main pancreatic duct. No enhancing pancreatic lesion. Spleen: No splenomegaly. No focal mass lesion. Adrenals/Urinary Tract: No adrenal nodule or mass. 4.2 cm simple cyst  identified upper pole left kidney. No enhancing renal lesion. Stomach/Bowel: Stomach is nondistended. No gastric wall thickening. No evidence of outlet obstruction. Duodenum is normally positioned as is the ligament of Treitz. No bowel dilatation in the visualized abdomen. Vascular/Lymphatic: No abdominal aortic aneurysm. No lymphadenopathy in the visualized abdomen. Other: No intraperitoneal free fluid. Musculoskeletal: 1 cm enhancing focus is identified in the anterior aspect the T12 vertebral body. Comparison a prior CT does not show a trabecular pattern highly suggestive of cavernous hemangioma. IMPRESSION: 1. 1.8 cm simple appearing cystic lesion exophytic from the uncinate process of the pancreas is assisted with other scattered tiny pancreatic parenchymal cyst. Repeat MRI in 12 months is recommended. This recommendation follows ACR consensus guidelines: Management of Incidental Pancreatic Cysts: A White Paper of the ACR Incidental Findings Committee. [place] 7989;21:194-174. 2. 1 cm  enhancing focus identified in the anterior aspect of the T12 vertebral body. Imaging features are not suggestive of cavernous hemangioma. Given the history of bladder cancer, metastatic involvement cannot be excluded. Bone scan may prove helpful to further evaluate. Electronically Signed   By: Misty Stanley M.D.   On: 03/30/2017 13:57   Nm Bone Scan Whole Body  Result Date: 04/10/2017 CLINICAL DATA:  History of bladder carcinoma with bone lesion at T12 noted by MR. EXAM: NUCLEAR MEDICINE WHOLE BODY BONE SCAN TECHNIQUE: Whole body anterior and posterior images were obtained approximately 3 hours after intravenous injection of radiopharmaceutical. RADIOPHARMACEUTICALS:  22.9 mCi Technetium-67m MDP IV COMPARISON:  CT abdomen pelvis of 03/09/2017, and MRI abdomen of 03/30/2017 FINDINGS: The area in question at T12 by MRI of the abdomen does not show increased uptake of radionuclide by total body bone scan. There is some  activity noted in the lower lumbar spine particularly to the right of midline which correlates with degenerative change involving the facet joints. No intense focus of activity is seen to indicate metastatic involvement of the skeleton. IMPRESSION: 1. No abnormality is seen in the region of T12 to suggest a bone metastasis when compared to the prior MRI images. 2. Negative total body bone scan other than probable degenerative change of the facet joints of the lower lumbar spine. Electronically Signed   By: Ivar Drape M.D.   On: 04/10/2017 14:12    Assessment and plan- Patient is a 71 y.o. 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 Given that patient has muscle invasive bladder cancer, standard of care remains neoadjuvant chemotherapy followed by definitive surgery. I explained to the patient that MVAC or dose dense MVAC remains standard on care in the neoadjuvant setting. Gemcitabine/ cisplatin chemotherapy has not been compared head to head with MVAC in this setting. Data has been extrapolated from metastatic setting where GC is better tolerated with less side effects. Given his age and concern for toxicity, I would recommend doing combination chemotherapy with gemcitabine 800mg  /meter square d1, 8 and 15 along with split dose cisplatin at 35 mg/meter square D1 and D8 every 28 days for up to 6 cycles. I discussed the risks and benefits of chemotherapy including all but not limited to nausea, vomiting, fatigue, risk of low blood counts and infections. Risk of hearing loss as well as daily toxicity associated with cisplatin. Patient understands and agrees to proceed. We will plan to get a baseline audiometry evaluation right to starting cisplatin. I will also plan to get a port placement for chemotherapy and he will be attending chemotherapy  class this week. Patient will be seeing Dr. Tammi Klippel from [facility] urology this week and I will tentatively plan to start chemotherapy in about 1 week's time. Chemotherapy will be given prior to surgery with a curative intent. We may have to dose modify chemotherapy based on his tolerance and blood counts along the way. I will see him in 1 week with cbc, cmp and tentatively plan to start treatment that day. His recent cbc, cmp from April 2018 was WNL  afib- would like him to hold his eliquis for now and continue aspirin 81 mg. If he does not have significant hematuria and/or anemia with treatment, we can restart it at some point   Thank you for this kind referral and the opportunity to participate  in the care of this patient   Visit Diagnosis 1. Malignant neoplasm of posterior wall of urinary bladder (HCC)   2. Goals of care, counseling/discussion     Dr. Randa Evens, MD, MPH [facility] at [facility] Pager- [phone] 04/23/2017  10:20 AM

## 2017-04-23 NOTE — Patient Instructions (Signed)
Cisplatin injection What is this medicine? CISPLATIN (SIS pla tin) is a chemotherapy drug. It targets fast dividing cells, like cancer cells, and causes these cells to die. This medicine is used to treat many types of cancer like bladder, ovarian, and testicular cancers. This medicine may be used for other purposes; ask your health care provider or pharmacist if you have questions. COMMON BRAND NAME(S): Platinol, Platinol -AQ What should I tell my health care provider before I take this medicine? They need to know if you have any of these conditions: -blood disorders -hearing problems -kidney disease -recent or ongoing radiation therapy -an unusual or allergic reaction to cisplatin, carboplatin, other chemotherapy, other medicines, foods, dyes, or preservatives -pregnant or trying to get pregnant -breast-feeding How should I use this medicine? This drug is given as an infusion into a vein. It is administered in a hospital or clinic by a specially trained health care professional. Talk to your pediatrician regarding the use of this medicine in children. Special care may be needed. Overdosage: If you think you have taken too much of this medicine contact a poison control center or emergency room at once. NOTE: This medicine is only for you. Do not share this medicine with others. What if I miss a dose? It is important not to miss a dose. Call your doctor or health care professional if you are unable to keep an appointment. What may interact with this medicine? -dofetilide -foscarnet -medicines for seizures -medicines to increase blood counts like filgrastim, pegfilgrastim, sargramostim -probenecid -pyridoxine used with altretamine -rituximab -some antibiotics like amikacin, gentamicin, neomycin, polymyxin B, streptomycin, tobramycin -sulfinpyrazone -vaccines -zalcitabine Talk to your doctor or health care professional before taking any of these  medicines: -acetaminophen -aspirin -ibuprofen -ketoprofen -naproxen This list may not describe all possible interactions. Give your health care provider a list of all the medicines, herbs, non-prescription drugs, or dietary supplements you use. Also tell them if you smoke, drink alcohol, or use illegal drugs. Some items may interact with your medicine. What should I watch for while using this medicine? Your condition will be monitored carefully while you are receiving this medicine. You will need important blood work done while you are taking this medicine. This drug may make you feel generally unwell. This is not uncommon, as chemotherapy can affect healthy cells as well as cancer cells. Report any side effects. Continue your course of treatment even though you feel ill unless your doctor tells you to stop. In some cases, you may be given additional medicines to help with side effects. Follow all directions for their use. Call your doctor or health care professional for advice if you get a fever, chills or sore throat, or other symptoms of a cold or flu. Do not treat yourself. This drug decreases your body's ability to fight infections. Try to avoid being around people who are sick. This medicine may increase your risk to bruise or bleed. Call your doctor or health care professional if you notice any unusual bleeding. Be careful brushing and flossing your teeth or using a toothpick because you may get an infection or bleed more easily. If you have any dental work done, tell your dentist you are receiving this medicine. Avoid taking products that contain aspirin, acetaminophen, ibuprofen, naproxen, or ketoprofen unless instructed by your doctor. These medicines may hide a fever. Do not become pregnant while taking this medicine. Women should inform their doctor if they wish to become pregnant or think they might be pregnant. There is a   potential for serious side effects to an unborn child. Talk to  your health care professional or pharmacist for more information. Do not breast-feed an infant while taking this medicine. Drink fluids as directed while you are taking this medicine. This will help protect your kidneys. Call your doctor or health care professional if you get diarrhea. Do not treat yourself. What side effects may I notice from receiving this medicine? Side effects that you should report to your doctor or health care professional as soon as possible: -allergic reactions like skin rash, itching or hives, swelling of the face, lips, or tongue -signs of infection - fever or chills, cough, sore throat, pain or difficulty passing urine -signs of decreased platelets or bleeding - bruising, pinpoint red spots on the skin, black, tarry stools, nosebleeds -signs of decreased red blood cells - unusually weak or tired, fainting spells, lightheadedness -breathing problems -changes in hearing -gout pain -low blood counts - This drug may decrease the number of white blood cells, red blood cells and platelets. You may be at increased risk for infections and bleeding. -nausea and vomiting -pain, swelling, redness or irritation at the injection site -pain, tingling, numbness in the hands or feet -problems with balance, movement -trouble passing urine or change in the amount of urine Side effects that usually do not require medical attention (report to your doctor or health care professional if they continue or are bothersome): -changes in vision -loss of appetite -metallic taste in the mouth or changes in taste This list may not describe all possible side effects. Call your doctor for medical advice about side effects. You may report side effects to FDA at 1-800-FDA-1088. Where should I keep my medicine? This drug is given in a hospital or clinic and will not be stored at home. NOTE: This sheet is a summary. It may not cover all possible information. If you have questions about this medicine,  talk to your doctor, pharmacist, or health care provider.  2018 Elsevier/Gold Standard (2008-03-10 14:40:54) Gemcitabine injection What is this medicine? GEMCITABINE (jem SIT a been) is a chemotherapy drug. This medicine is used to treat many types of cancer like breast cancer, lung cancer, pancreatic cancer, and ovarian cancer. This medicine may be used for other purposes; ask your health care provider or pharmacist if you have questions. COMMON BRAND NAME(S): Gemzar What should I tell my health care provider before I take this medicine? They need to know if you have any of these conditions: -blood disorders -infection -kidney disease -liver disease -recent or ongoing radiation therapy -an unusual or allergic reaction to gemcitabine, other chemotherapy, other medicines, foods, dyes, or preservatives -pregnant or trying to get pregnant -breast-feeding How should I use this medicine? This drug is given as an infusion into a vein. It is administered in a hospital or clinic by a specially trained health care professional. Talk to your pediatrician regarding the use of this medicine in children. Special care may be needed. Overdosage: If you think you have taken too much of this medicine contact a poison control center or emergency room at once. NOTE: This medicine is only for you. Do not share this medicine with others. What if I miss a dose? It is important not to miss your dose. Call your doctor or health care professional if you are unable to keep an appointment. What may interact with this medicine? -medicines to increase blood counts like filgrastim, pegfilgrastim, sargramostim -some other chemotherapy drugs like cisplatin -vaccines Talk to your doctor or health   care professional before taking any of these medicines: -acetaminophen -aspirin -ibuprofen -ketoprofen -naproxen This list may not describe all possible interactions. Give your health care provider a list of all the  medicines, herbs, non-prescription drugs, or dietary supplements you use. Also tell them if you smoke, drink alcohol, or use illegal drugs. Some items may interact with your medicine. What should I watch for while using this medicine? Visit your doctor for checks on your progress. This drug may make you feel generally unwell. This is not uncommon, as chemotherapy can affect healthy cells as well as cancer cells. Report any side effects. Continue your course of treatment even though you feel ill unless your doctor tells you to stop. In some cases, you may be given additional medicines to help with side effects. Follow all directions for their use. Call your doctor or health care professional for advice if you get a fever, chills or sore throat, or other symptoms of a cold or flu. Do not treat yourself. This drug decreases your body's ability to fight infections. Try to avoid being around people who are sick. This medicine may increase your risk to bruise or bleed. Call your doctor or health care professional if you notice any unusual bleeding. Be careful brushing and flossing your teeth or using a toothpick because you may get an infection or bleed more easily. If you have any dental work done, tell your dentist you are receiving this medicine. Avoid taking products that contain aspirin, acetaminophen, ibuprofen, naproxen, or ketoprofen unless instructed by your doctor. These medicines may hide a fever. Women should inform their doctor if they wish to become pregnant or think they might be pregnant. There is a potential for serious side effects to an unborn child. Talk to your health care professional or pharmacist for more information. Do not breast-feed an infant while taking this medicine. What side effects may I notice from receiving this medicine? Side effects that you should report to your doctor or health care professional as soon as possible: -allergic reactions like skin rash, itching or hives,  swelling of the face, lips, or tongue -low blood counts - this medicine may decrease the number of white blood cells, red blood cells and platelets. You may be at increased risk for infections and bleeding. -signs of infection - fever or chills, cough, sore throat, pain or difficulty passing urine -signs of decreased platelets or bleeding - bruising, pinpoint red spots on the skin, black, tarry stools, blood in the urine -signs of decreased red blood cells - unusually weak or tired, fainting spells, lightheadedness -breathing problems -chest pain -mouth sores -nausea and vomiting -pain, swelling, redness at site where injected -pain, tingling, numbness in the hands or feet -stomach pain -swelling of ankles, feet, hands -unusual bleeding Side effects that usually do not require medical attention (report to your doctor or health care professional if they continue or are bothersome): -constipation -diarrhea -hair loss -loss of appetite -stomach upset This list may not describe all possible side effects. Call your doctor for medical advice about side effects. You may report side effects to FDA at 1-800-FDA-1088. Where should I keep my medicine? This drug is given in a hospital or clinic and will not be stored at home. NOTE: This sheet is a summary. It may not cover all possible information. If you have questions about this medicine, talk to your doctor, pharmacist, or health care provider.  2018 Elsevier/Gold Standard (2008-04-14 18:45:54)  

## 2017-04-23 NOTE — Progress Notes (Signed)
Patient here for initial visit. No complaints of pain, appetite good, sleeping WNL.

## 2017-04-23 NOTE — Telephone Encounter (Signed)
Called pt and wife answered the phone and the only appt they have this month for hearing test is 5/8 at 2:30.  The wife says they can make it and she knows the building they ae located in and let them know you are here for the hearing test.  Faxed over demographic sheet for the test to be done.

## 2017-04-24 ENCOUNTER — Inpatient Hospital Stay: Payer: Medicare Other

## 2017-04-26 ENCOUNTER — Other Ambulatory Visit: Payer: Self-pay | Admitting: Radiology

## 2017-04-26 NOTE — Progress Notes (Signed)
Per MD, order for normal regimen of Cis D2/ Gem D1,8,15 was changed to Cis D1,8/Gem D1,8,15 with the normal cisplatin dose of 70mg /m2 changed to be given as split dose of 35mg /m2 and given on day 1 and 8 instead of day 2.

## 2017-04-27 ENCOUNTER — Ambulatory Visit
Admission: RE | Admit: 2017-04-27 | Discharge: 2017-04-27 | Disposition: A | Discharge status: Home / Self Care | Payer: Medicare Other | Source: Ambulatory Visit | Attending: Oncology | Admitting: Oncology

## 2017-04-27 DIAGNOSIS — K219 Gastro-esophageal reflux disease without esophagitis: Secondary | ICD-10-CM | POA: Diagnosis not present

## 2017-04-27 DIAGNOSIS — C679 Malignant neoplasm of bladder, unspecified: Secondary | ICD-10-CM | POA: Diagnosis present

## 2017-04-27 DIAGNOSIS — C674 Malignant neoplasm of posterior wall of bladder: Secondary | ICD-10-CM

## 2017-04-27 DIAGNOSIS — I480 Paroxysmal atrial fibrillation: Secondary | ICD-10-CM | POA: Diagnosis not present

## 2017-04-27 DIAGNOSIS — Z87891 Personal history of nicotine dependence: Secondary | ICD-10-CM | POA: Diagnosis not present

## 2017-04-27 DIAGNOSIS — R7303 Prediabetes: Secondary | ICD-10-CM | POA: Diagnosis not present

## 2017-04-27 DIAGNOSIS — I10 Essential (primary) hypertension: Secondary | ICD-10-CM | POA: Diagnosis not present

## 2017-04-27 DIAGNOSIS — E785 Hyperlipidemia, unspecified: Secondary | ICD-10-CM | POA: Diagnosis not present

## 2017-04-27 DIAGNOSIS — Z7189 Other specified counseling: Secondary | ICD-10-CM

## 2017-04-27 HISTORY — PX: IR FLUORO GUIDE PORT INSERTION RIGHT: IMG5741

## 2017-04-27 LAB — CBC
HCT: 41.6 % (ref 40.0–52.0)
Hemoglobin: 14.2 g/dL (ref 13.0–18.0)
MCH: 31.4 pg (ref 26.0–34.0)
MCHC: 34.2 g/dL (ref 32.0–36.0)
MCV: 91.8 fL (ref 80.0–100.0)
PLATELETS: 272 10*3/uL (ref 150–440)
RBC: 4.54 MIL/uL (ref 4.40–5.90)
RDW: 13.7 % (ref 11.5–14.5)
WBC: 6.2 10*3/uL (ref 3.8–10.6)

## 2017-04-27 LAB — PROTIME-INR
INR: 1.06
Prothrombin Time: 13.8 seconds (ref 11.4–15.2)

## 2017-04-27 LAB — APTT: aPTT: 31 seconds (ref 24–36)

## 2017-04-27 MED ORDER — FENTANYL CITRATE (PF) 100 MCG/2ML IJ SOLN
INTRAMUSCULAR | Status: AC
  Filled 2017-04-27: qty 4

## 2017-04-27 MED ORDER — FENTANYL CITRATE (PF) 100 MCG/2ML IJ SOLN
INTRAMUSCULAR | Status: AC | PRN
  Administered 2017-04-27: 50 ug via INTRAVENOUS

## 2017-04-27 MED ORDER — CEFAZOLIN SODIUM-DEXTROSE 2-4 GM/100ML-% IV SOLN
2.0000 g | Freq: Once | INTRAVENOUS | Status: AC
  Administered 2017-04-27: 2 g via INTRAVENOUS

## 2017-04-27 MED ORDER — MIDAZOLAM HCL 5 MG/5ML IJ SOLN
INTRAMUSCULAR | Status: AC | PRN
  Administered 2017-04-27 (×2): 1 mg via INTRAVENOUS

## 2017-04-27 MED ORDER — MIDAZOLAM HCL 5 MG/5ML IJ SOLN
INTRAMUSCULAR | Status: AC
  Filled 2017-04-27: qty 5

## 2017-04-27 MED ORDER — SODIUM CHLORIDE FLUSH 0.9 % IV SOLN
INTRAVENOUS | Status: AC
  Filled 2017-04-27: qty 10

## 2017-04-27 MED ORDER — HEPARIN SOD (PORK) LOCK FLUSH 100 UNIT/ML IV SOLN
INTRAVENOUS | Status: AC
  Administered 2017-04-27: 14:00:00
  Filled 2017-04-27: qty 5

## 2017-04-27 MED ORDER — LIDOCAINE HCL (PF) 1 % IJ SOLN
INTRAMUSCULAR | Status: AC
  Filled 2017-04-27: qty 30

## 2017-04-27 MED ORDER — SODIUM CHLORIDE 0.9 % IV SOLN: INTRAVENOUS | Status: DC

## 2017-04-27 NOTE — Procedures (Signed)
Interventional Radiology Procedure Note  Procedure: Placement of a right IJ approach single lumen PowerPort.  Tip is positioned at the superior cavoatrial junction and catheter is ready for immediate use.  Complications: No immediate Recommendations:  - Ok to shower tomorrow - Do not submerge for 7 days - Routine line care   Raya Mckinstry T. Sundi Slevin, M.D Pager:  [phone]   

## 2017-04-27 NOTE — H&P (Signed)
Chief Complaint: Patient was seen in consultation today for port placement at the request of Rao,Archana C  Referring Physician(s): Rao,Archana C  Patient Status: [facility] - Out-pt  History of Present Illness: Randy Wheeler is a 71 y.o. male with a history of muscle invasive bladder carcinoma here for port placement prior to beginning chemotherapy next week.  Past Medical History:  Diagnosis Date  . Cancer [facility])    Bladder  . Dysrhythmia    Atrial fib  . GERD (gastroesophageal reflux disease)   . Hyperlipidemia   . Hypertension   . Paroxysmal atrial fibrillation (HCC)   . Pre-diabetes    diet controlled  . Sexual dysfunction     Past Surgical History:  Procedure Laterality Date  . APPENDECTOMY    . COLONOSCOPY WITH PROPOFOL N/A 10/25/2015   Procedure: COLONOSCOPY WITH PROPOFOL;  Surgeon: Manya Silvas, MD;  Location: [facility] ENDOSCOPY;  Service: Endoscopy;  Laterality: N/A;  . TRANSURETHRAL RESECTION OF BLADDER TUMOR N/A 04/02/2017   Procedure: TRANSURETHRAL RESECTION OF BLADDER TUMOR (TURBT);  Surgeon: Hollice Espy, MD;  Location: [facility] ORS;  Service: Urology;  Laterality: N/A;    Allergies: Patient has no known allergies.  Medications: Prior to Admission medications   Medication Sig Start Date End Date Taking? Authorizing Provider  hydrochlorothiazide (HYDRODIURIL) 25 MG tablet Take 25 mg by mouth daily.   Yes [provider]  metoprolol succinate (TOPROL-XL) 50 MG 24 hr tablet Take 50 mg by mouth every evening. Take with or immediately following a meal.    Yes [provider]  omeprazole (PRILOSEC) 20 MG capsule Take 20 mg by mouth daily as needed (heartburn).    Yes [provider]  oxymetazoline (AFRIN) 0.05 % nasal spray Place 1 spray into left nostril at bedtime as needed for congestion.   Yes [provider]  dexamethasone (DECADRON) 4 MG tablet Take 2 tablets by mouth once a day on the day after cisplatin chemotherapy and  then take 2 tablets two times a day for 2 days. Take with food. Patient not taking: Reported on 04/27/2017 04/23/17   Sindy Guadeloupe, MD  famotidine (PEPCID AC) 10 MG chewable tablet Chew 10 mg by mouth daily as needed for heartburn.    [provider]  lidocaine-prilocaine (EMLA) cream Apply to affected area once 04/23/17   Sindy Guadeloupe, MD  LORazepam (ATIVAN) 0.5 MG tablet Take 1 tablet (0.5 mg total) by mouth every 6 (six) hours as needed (Nausea or vomiting). 04/23/17   Sindy Guadeloupe, MD  ondansetron (ZOFRAN) 8 MG tablet Take 1 tablet (8 mg total) by mouth 2 (two) times daily as needed. Start on the third day after cisplatin chemotherapy. 04/23/17   Sindy Guadeloupe, MD  prochlorperazine (COMPAZINE) 10 MG tablet Take 1 tablet (10 mg total) by mouth every 6 (six) hours as needed (Nausea or vomiting). 04/23/17   Sindy Guadeloupe, MD     Family History  Problem Relation Age of Onset  . Prostate cancer Father   . Stroke Father   . Atrial fibrillation Father   . Diabetes type II Father   . Kidney cancer Neg Hx     Social History   Social History  . Marital status: Married    Spouse name: N/A  . Number of children: N/A  . Years of education: N/A   Social History Main Topics  . Smoking status: Former Smoker    Packs/day: 1.00    Types: Cigarettes  Quit date: 10/21/1997  . Smokeless tobacco: Never Used  . Alcohol use Yes     Comment: 1-2 glasses of wine daily  . Drug use: No  . Sexual activity: Not Asked   Other Topics Concern  . None   Social History Narrative  . None    ECOG Status: 1 - Symptomatic but completely ambulatory  Review of Systems: A 12 point ROS discussed and pertinent positives are indicated in the HPI above.  All other systems are negative.  Review of Systems  Constitutional: Negative.   HENT: Negative.   Respiratory: Negative.   Cardiovascular: Negative.   Gastrointestinal: Negative.   Genitourinary: Positive for hematuria. Negative for difficulty  urinating, dysuria, flank pain, frequency and urgency.  Musculoskeletal: Negative.   Neurological: Negative.     Vital Signs: BP 117/86   Pulse (!) 102   Temp 98.8 F (37.1 C) (Oral)   Resp 14   SpO2 96%   Physical Exam  Constitutional: He is oriented to person, place, and time. He appears well-developed and well-nourished. No distress.  HENT:  Head: Normocephalic and atraumatic.  Neck: Neck supple. No JVD present.  Cardiovascular: Exam reveals no gallop and no friction rub.   No murmur heard. Irregular rate and rhythm   Pulmonary/Chest: Effort normal and breath sounds normal. No stridor. No respiratory distress. He has no wheezes. He has no rales.  Abdominal: Soft. Bowel sounds are normal. He exhibits no distension and no mass. There is no tenderness. There is no rebound and no guarding.  Musculoskeletal: He exhibits no edema.  Lymphadenopathy:    He has no cervical adenopathy.  Neurological: He is alert and oriented to person, place, and time.  Skin: He is not diaphoretic.  Vitals reviewed.   Mallampati Score:  MD Evaluation Airway: WNL Heart: WNL Abdomen: WNL Chest/ Lungs: WNL ASA  Classification: 3 Mallampati/Airway Score: One  Imaging: Chest 1 View  Result Date: 04/16/2017 CLINICAL DATA:  71 year old with new diagnosis of invasive bladder cancer. EXAM: CHEST 1 VIEW COMPARISON:  Visualized lung bases on CT abdomen and pelvis 03/09/2017. No prior chest imaging. FINDINGS: Cardiac silhouette normal in size. Thoracic aorta mildly atherosclerotic. Hilar and mediastinal contours otherwise unremarkable. Lungs clear. Bronchovascular markings normal. Pulmonary vascularity normal. No visible pleural effusions. No pneumothorax. IMPRESSION: No acute cardiopulmonary disease. No evidence of metastatic disease. Electronically Signed   By: Evangeline Dakin M.D.   On: 04/16/2017 15:09   Mr Abdomen W Wo Contrast  Result Date: 03/30/2017 CLINICAL DATA:  Pancreatic lesions seen on  recent CT scan. EXAM: MRI ABDOMEN WITHOUT AND WITH CONTRAST TECHNIQUE: Multiplanar multisequence MR imaging of the abdomen was performed both before and after the administration of intravenous contrast. CONTRAST:  8mL MULTIHANCE GADOBENATE DIMEGLUMINE 529 MG/ML IV SOLN COMPARISON:  CT scan 03/09/2017 FINDINGS: Lower chest:  Unremarkable. Hepatobiliary: Multiple cysts ranging in size from several mm up to 2.2 cm. There is no evidence for gallstones, gallbladder wall thickening, or pericholecystic fluid. No intrahepatic or extrahepatic biliary dilation. Pancreas: Cystic lesion uncinate process measures 1.8 cm today. This has simple MR imaging features and is associated with numerous other very tiny cystic foci scattered in the pancreatic head and tail. No dilatation the main pancreatic duct. No enhancing pancreatic lesion. Spleen: No splenomegaly. No focal mass lesion. Adrenals/Urinary Tract: No adrenal nodule or mass. 4.2 cm simple cyst identified upper pole left kidney. No enhancing renal lesion. Stomach/Bowel: Stomach is nondistended. No gastric wall thickening. No evidence of outlet obstruction. Duodenum is normally  positioned as is the ligament of Treitz. No bowel dilatation in the visualized abdomen. Vascular/Lymphatic: No abdominal aortic aneurysm. No lymphadenopathy in the visualized abdomen. Other: No intraperitoneal free fluid. Musculoskeletal: 1 cm enhancing focus is identified in the anterior aspect the T12 vertebral body. Comparison a prior CT does not show a trabecular pattern highly suggestive of cavernous hemangioma. IMPRESSION: 1. 1.8 cm simple appearing cystic lesion exophytic from the uncinate process of the pancreas is assisted with other scattered tiny pancreatic parenchymal cyst. Repeat MRI in 12 months is recommended. This recommendation follows ACR consensus guidelines: Management of Incidental Pancreatic Cysts: A White Paper of the ACR Incidental Findings Committee. J Am Coll Radiol  4098;11:914-782. 2. 1 cm enhancing focus identified in the anterior aspect of the T12 vertebral body. Imaging features are not suggestive of cavernous hemangioma. Given the history of bladder cancer, metastatic involvement cannot be excluded. Bone scan may prove helpful to further evaluate. Electronically Signed   By: Misty Stanley M.D.   On: 03/30/2017 13:57   Nm Bone Scan Whole Body  Result Date: 04/10/2017 CLINICAL DATA:  History of bladder carcinoma with bone lesion at T12 noted by MR. EXAM: NUCLEAR MEDICINE WHOLE BODY BONE SCAN TECHNIQUE: Whole body anterior and posterior images were obtained approximately 3 hours after intravenous injection of radiopharmaceutical. RADIOPHARMACEUTICALS:  22.9 mCi Technetium-88m MDP IV COMPARISON:  CT abdomen pelvis of 03/09/2017, and MRI abdomen of 03/30/2017 FINDINGS: The area in question at T12 by MRI of the abdomen does not show increased uptake of radionuclide by total body bone scan. There is some activity noted in the lower lumbar spine particularly to the right of midline which correlates with degenerative change involving the facet joints. No intense focus of activity is seen to indicate metastatic involvement of the skeleton. IMPRESSION: 1. No abnormality is seen in the region of T12 to suggest a bone metastasis when compared to the prior MRI images. 2. Negative total body bone scan other than probable degenerative change of the facet joints of the lower lumbar spine. Electronically Signed   By: Ivar Drape M.D.   On: 04/10/2017 14:12    Labs:  CBC:  Recent Labs  03/15/17 1341 04/03/17 0508 04/27/17 1258  WBC 6.7 7.0 6.2  HGB 15.1 13.2 14.2  HCT 44.1 38.1* 41.6  PLT 240 217 272    COAGS:  Recent Labs  03/15/17 1341  INR 0.99  APTT 29    BMP:  Recent Labs  03/09/17 1544 03/15/17 1341 04/03/17 0508  NA  --  138 136  K  --  3.9 3.4*  CL  --  99* 101  CO2  --  31 28  GLUCOSE  --  112* 133*  BUN  --  28* 17  CALCIUM  --  9.4 8.4*    CREATININE 1.00 0.84 0.84  GFRNONAA  --  >60 >60  GFRAA  --  >60 >60    LIVER FUNCTION TESTS:  Recent Labs  04/16/17 1431  BILITOT 0.4  AST 39  ALT 39  ALKPHOS 37*  PROT 7.6  ALBUMIN 4.0     Assessment and Plan:  For port placement today.  Risks and benefits discussed with the patient including, but not limited to bleeding, infection, pneumothorax, or fibrin sheath development and need for additional procedures. All of the patient's questions were answered, patient is agreeable to proceed. Consent signed and in chart.  Thank you for this interesting consult.  I greatly enjoyed meeting Lukka Black Furney and look  forward to participating in their care.  A copy of this report was sent to the requesting provider on this date.  Electronically SignedAletta Edouard T 04/27/2017, 1:34 PM   I spent a total of 15 Minutes  in face to face in clinical consultation, greater than 50% of which was counseling/coordinating care for port placement.

## 2017-04-30 DIAGNOSIS — I340 Nonrheumatic mitral (valve) insufficiency: Secondary | ICD-10-CM | POA: Insufficient documentation

## 2017-05-01 ENCOUNTER — Inpatient Hospital Stay: Payer: Medicare Other

## 2017-05-01 ENCOUNTER — Inpatient Hospital Stay (HOSPITAL_BASED_OUTPATIENT_CLINIC_OR_DEPARTMENT_OTHER): Payer: Medicare Other | Admitting: Oncology

## 2017-05-01 ENCOUNTER — Other Ambulatory Visit: Payer: Medicare Other

## 2017-05-01 ENCOUNTER — Encounter: Payer: Self-pay | Admitting: Oncology

## 2017-05-01 VITALS — BP 115/88 | HR 94 | Temp 97.1°F | Resp 18 | Wt 214.3 lb

## 2017-05-01 DIAGNOSIS — E785 Hyperlipidemia, unspecified: Secondary | ICD-10-CM

## 2017-05-01 DIAGNOSIS — R5383 Other fatigue: Secondary | ICD-10-CM | POA: Diagnosis not present

## 2017-05-01 DIAGNOSIS — I10 Essential (primary) hypertension: Secondary | ICD-10-CM

## 2017-05-01 DIAGNOSIS — R319 Hematuria, unspecified: Secondary | ICD-10-CM

## 2017-05-01 DIAGNOSIS — R7303 Prediabetes: Secondary | ICD-10-CM | POA: Diagnosis not present

## 2017-05-01 DIAGNOSIS — C674 Malignant neoplasm of posterior wall of bladder: Secondary | ICD-10-CM

## 2017-05-01 DIAGNOSIS — I480 Paroxysmal atrial fibrillation: Secondary | ICD-10-CM

## 2017-05-01 DIAGNOSIS — R5381 Other malaise: Secondary | ICD-10-CM | POA: Diagnosis not present

## 2017-05-01 DIAGNOSIS — Z79899 Other long term (current) drug therapy: Secondary | ICD-10-CM | POA: Diagnosis not present

## 2017-05-01 DIAGNOSIS — K219 Gastro-esophageal reflux disease without esophagitis: Secondary | ICD-10-CM

## 2017-05-01 DIAGNOSIS — Z87891 Personal history of nicotine dependence: Secondary | ICD-10-CM | POA: Diagnosis not present

## 2017-05-01 DIAGNOSIS — Z5111 Encounter for antineoplastic chemotherapy: Secondary | ICD-10-CM

## 2017-05-01 DIAGNOSIS — Z7189 Other specified counseling: Secondary | ICD-10-CM

## 2017-05-01 DIAGNOSIS — Z7982 Long term (current) use of aspirin: Secondary | ICD-10-CM

## 2017-05-01 DIAGNOSIS — Z8042 Family history of malignant neoplasm of prostate: Secondary | ICD-10-CM

## 2017-05-01 LAB — CBC WITH DIFFERENTIAL/PLATELET
BASOS ABS: 0 10*3/uL (ref 0–0.1)
BASOS PCT: 1 %
EOS PCT: 3 %
Eosinophils Absolute: 0.2 10*3/uL (ref 0–0.7)
HEMATOCRIT: 40.1 % (ref 40.0–52.0)
Hemoglobin: 13.9 g/dL (ref 13.0–18.0)
LYMPHS PCT: 29 %
Lymphs Abs: 2 10*3/uL (ref 1.0–3.6)
MCH: 32 pg (ref 26.0–34.0)
MCHC: 34.8 g/dL (ref 32.0–36.0)
MCV: 91.9 fL (ref 80.0–100.0)
MONO ABS: 0.8 10*3/uL (ref 0.2–1.0)
Monocytes Relative: 12 %
NEUTROS ABS: 3.8 10*3/uL (ref 1.4–6.5)
Neutrophils Relative %: 55 %
PLATELETS: 238 10*3/uL (ref 150–440)
RBC: 4.36 MIL/uL — AB (ref 4.40–5.90)
RDW: 13.9 % (ref 11.5–14.5)
WBC: 6.8 10*3/uL (ref 3.8–10.6)

## 2017-05-01 LAB — COMPREHENSIVE METABOLIC PANEL
ALBUMIN: 4 g/dL (ref 3.5–5.0)
ALT: 38 U/L (ref 17–63)
AST: 32 U/L (ref 15–41)
Alkaline Phosphatase: 33 U/L — ABNORMAL LOW (ref 38–126)
Anion gap: 4 — ABNORMAL LOW (ref 5–15)
BUN: 29 mg/dL — AB (ref 6–20)
CHLORIDE: 99 mmol/L — AB (ref 101–111)
CO2: 29 mmol/L (ref 22–32)
CREATININE: 0.85 mg/dL (ref 0.61–1.24)
Calcium: 9.1 mg/dL (ref 8.9–10.3)
GFR calc Af Amer: >60 mL/min (ref >60)
GFR calc non Af Amer: >60 mL/min (ref >60)
GLUCOSE: 177 mg/dL — AB (ref 65–99)
POTASSIUM: 4.2 mmol/L (ref 3.5–5.1)
Sodium: 132 mmol/L — ABNORMAL LOW (ref 135–145)
Total Bilirubin: 0.8 mg/dL (ref 0.3–1.2)
Total Protein: 7.6 g/dL (ref 6.5–8.1)

## 2017-05-01 MED ORDER — SODIUM CHLORIDE 0.9% FLUSH
10.0000 mL | INTRAVENOUS | Status: DC | PRN
  Administered 2017-05-01: 10 mL via INTRAVENOUS
  Filled 2017-05-01: qty 10

## 2017-05-01 MED ORDER — SODIUM CHLORIDE 0.9 % IV SOLN
Freq: Once | INTRAVENOUS | Status: AC
  Administered 2017-05-01: 12:00:00 via INTRAVENOUS
  Filled 2017-05-01: qty 5

## 2017-05-01 MED ORDER — SODIUM CHLORIDE 0.9 % IV SOLN
1800.0000 mg | Freq: Once | INTRAVENOUS | Status: AC
  Administered 2017-05-01: 1800 mg via INTRAVENOUS
  Filled 2017-05-01: qty 26.3

## 2017-05-01 MED ORDER — POTASSIUM CHLORIDE 2 MEQ/ML IV SOLN
Freq: Once | INTRAVENOUS | Status: AC
  Administered 2017-05-01: 10:00:00 via INTRAVENOUS
  Filled 2017-05-01: qty 1000

## 2017-05-01 MED ORDER — SODIUM CHLORIDE 0.9 % IV SOLN
35.0000 mg/m2 | Freq: Once | INTRAVENOUS | Status: AC
  Administered 2017-05-01: 78 mg via INTRAVENOUS
  Filled 2017-05-01: qty 78

## 2017-05-01 MED ORDER — PALONOSETRON HCL INJECTION 0.25 MG/5ML
0.2500 mg | Freq: Once | INTRAVENOUS | Status: AC
  Administered 2017-05-01: 0.25 mg via INTRAVENOUS
  Filled 2017-05-01: qty 5

## 2017-05-01 MED ORDER — HEPARIN SOD (PORK) LOCK FLUSH 100 UNIT/ML IV SOLN
500.0000 [IU] | Freq: Once | INTRAVENOUS | Status: AC
  Administered 2017-05-01: 500 [IU] via INTRAVENOUS
  Filled 2017-05-01: qty 5

## 2017-05-01 MED ORDER — SODIUM CHLORIDE 0.9 % IV SOLN
Freq: Once | INTRAVENOUS | Status: AC
  Administered 2017-05-01: 09:00:00 via INTRAVENOUS
  Filled 2017-05-01: qty 1000

## 2017-05-01 NOTE — Progress Notes (Signed)
Pt here prior to day one of chemo. Stated he has a great attitude

## 2017-05-01 NOTE — Progress Notes (Signed)
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 Patient is a 71 year old male with a past medical history significant for atrial fibrillation for which he was on Eliquis in the past. In August 2017 patient had minor trauma to his abdomen while bone pain and then left hematuria or immediately following that which lasted for about a couple of days. Patient thought that this was likely secondary to trauma . However continued to have intermittent hematuria since then and then had a second major episode of bleeding in March 2018. He spoke to his primary care doctor who suggested to stop Eliquis at this time. However bleeding continued for 2-3 days after stopping Eliquis and hence he was referred to urology [name]  2. Ct abdomen on 03/09/17 showed: IMPRESSION: 1. 5.6 cm polypoid mass arises from the posterior right bladder wall, consistent with urothelial neoplasm. 2. 2 cm exophytic hypoattenuating lesion identified along the uncinate process of the pancreas. Dedicated abdominal MRI without and with contrast may prove helpful to further evaluate.  3. MRI abdomen on 03/30/17 showed: . 1.8 cm simple appearing cystic lesion exophytic from the uncinate process of the pancreas is assisted with other scattered tiny pancreatic parenchymal cyst. Repeat MRI in 12 months is recommended. This recommendation follows ACR consensus guidelines: Management of Incidental Pancreatic Cysts: A White Paper of the ACR Incidental Findings  Committee. J Am Coll Radiol [id number];[id number]. 2. 1 cm enhancing focus identified in the anterior aspect of the T12 vertebral body. Imaging features are not suggestive of cavernous hemangioma. Given the history of bladder cancer, metastatic involvement cannot be excluded. Bone scan may prove helpful to further evaluate.  4. Bone scan on 04/10/17 showed: IMPRESSION: 1. No abnormality is seen in the region of T12 to suggest a bone metastasis when compared to the prior MRI images. 2. Negative total body bone scan other than probable degenerative change of the facet joints of the lower lumbar spine.  5. CXr was negative for metastatic disease. Patient underwent TURBT on 04/02/17 which showed: DIAGNOSIS:  A. BLADDER TUMOR; TURBT:  - PAPILLARY UROTHELIAL CARCINOMA, INVADING MUSCULARIS PROPRIA,  HIGH-GRADE (WHO/ISUP).  6. Case was discussed at Tumor board and patient was thought to have atleast T2 cN0 muscle invasive bladder cancer. He has been sent to [place] for neoadjuvant chemo recommendations  7. Patient feels a little fatigued and has intermittent hematuria. He remains off eliquis. Denies other complaints   Interval history- denies any hematuria for a week  ECOG PS- 0 Pain scale- 0 Opioid associated constipation- no  Review of systems- Review of Systems  Constitutional: Negative for chills, fever, malaise/fatigue and weight loss.  HENT: Negative for congestion, ear discharge and nosebleeds.   Eyes: Negative for blurred vision.  Respiratory: Negative for cough, hemoptysis, sputum production, shortness of breath and wheezing.   Cardiovascular: Negative for chest pain, palpitations, orthopnea and claudication.  Gastrointestinal: Negative for abdominal pain, blood in stool, constipation, diarrhea, heartburn, melena, nausea and vomiting.  Genitourinary: Negative for dysuria, flank pain, frequency, hematuria and urgency.  Musculoskeletal: Negative for back pain, joint  pain and myalgias.    Skin: Negative for rash.  Neurological: Negative for dizziness, tingling, focal weakness, seizures, weakness and headaches.  Endo/Heme/Allergies: Does not bruise/bleed easily.  Psychiatric/Behavioral: Negative for depression and suicidal ideas. The patient does not have insomnia.       No Known Allergies   Past Medical History:  Diagnosis Date  . Cancer [facility])    Bladder  . Dysrhythmia    Atrial fib  . GERD (gastroesophageal reflux disease)   . Hyperlipidemia   . Hypertension   . Paroxysmal atrial fibrillation (HCC)   . Pre-diabetes    diet controlled  . Sexual dysfunction      Past Surgical History:  Procedure Laterality Date  . APPENDECTOMY    . COLONOSCOPY WITH PROPOFOL N/A 10/25/2015   Procedure: COLONOSCOPY WITH PROPOFOL;  Surgeon: Manya Silvas, MD;  Location: [facility] ENDOSCOPY;  Service: Endoscopy;  Laterality: N/A;  . IR FLUORO GUIDE PORT INSERTION RIGHT  04/27/2017  . TRANSURETHRAL RESECTION OF BLADDER TUMOR N/A 04/02/2017   Procedure: TRANSURETHRAL RESECTION OF BLADDER TUMOR (TURBT);  Surgeon: Hollice Espy, MD;  Location: [facility] ORS;  Service: Urology;  Laterality: N/A;    Social History   Social History  . Marital status: Married    Spouse name: N/A  . Number of children: N/A  . Years of education: N/A   Occupational History  . Not on file.   Social History Main Topics  . Smoking status: Former Smoker    Packs/day: 1.00    Types: Cigarettes    Quit date: 10/21/1997  . Smokeless tobacco: Never Used  . Alcohol use Yes     Comment: 1-2 glasses of wine daily  . Drug use: No  . Sexual activity: Not on file   Other Topics Concern  . Not on file   Social History Narrative  . No narrative on file    Family History  Problem Relation Age of Onset  . Prostate cancer Father   . Stroke Father   . Atrial fibrillation Father   . Diabetes type II Father   . Kidney cancer Neg Hx      Current Outpatient Prescriptions:  .  dexamethasone (DECADRON) 4 MG  tablet, Take 2 tablets by mouth once a day on the day after cisplatin chemotherapy and then take 2 tablets two times a day for 2 days. Take with food. (Patient not taking: Reported on 04/27/2017), Disp: 30 tablet, Rfl: 1 .  famotidine (PEPCID AC) 10 MG chewable tablet, Chew 10 mg by mouth daily as needed for heartburn., Disp: , Rfl:  .  hydrochlorothiazide (HYDRODIURIL) 25 MG tablet, Take 25 mg by mouth daily., Disp: , Rfl:  .  lidocaine-prilocaine (EMLA) cream, Apply to affected area once, Disp: 30 g, Rfl: 3 .  LORazepam (ATIVAN) 0.5 MG tablet, Take 1 tablet (0.5 mg total) by mouth every 6 (six) hours as needed (Nausea or vomiting)., Disp: 30 tablet, Rfl: 0 .  metoprolol succinate (TOPROL-XL) 50 MG 24 hr tablet, Take 50 mg by mouth every evening. Take with or immediately following a meal. , Disp: , Rfl:  .  omeprazole (PRILOSEC) 20 MG capsule, Take 20 mg by mouth daily as needed (heartburn). , Disp: , Rfl:  .  ondansetron (ZOFRAN) 8 MG tablet, Take 1 tablet (8 mg total) by mouth 2 (two) times daily as needed. Start on the third day after cisplatin chemotherapy., Disp: 30 tablet, Rfl: 1 .  oxymetazoline (AFRIN) 0.05 % nasal spray, Place 1 spray into left  nostril at bedtime as needed for congestion., Disp: , Rfl:  .  prochlorperazine (COMPAZINE) 10 MG tablet, Take 1 tablet (10 mg total) by mouth every 6 (six) hours as needed (Nausea or vomiting)., Disp: 30 tablet, Rfl: 1  Physical exam:  Vitals:   05/01/17 0839  BP: 115/88  Pulse: 94  Resp: 18  Temp: 97.1 F (36.2 C)  Weight: 214 lb 4.8 oz (97.2 kg)   Physical Exam  Constitutional: He is oriented to person, place, and time and well-developed, well-nourished, and in no distress.  HENT:  Head: Normocephalic and atraumatic.  Eyes: EOM are normal. Pupils are equal, round, and reactive to light.  Neck: Normal range of motion.  Cardiovascular: Normal rate, regular rhythm and normal heart sounds.   Pulmonary/Chest: Effort normal and breath sounds  normal.  Abdominal: Soft. Bowel sounds are normal.  Neurological: He is alert and oriented to person, place, and time.  Skin: Skin is warm and dry.   port site looks good. No infection  CMP Latest Ref Rng & Units 04/16/2017  Glucose 65 - 99 mg/dL -  BUN 6 - 20 mg/dL -  Creatinine 0.61 - 1.24 mg/dL -  Sodium 135 - 145 mmol/L -  Potassium 3.5 - 5.1 mmol/L -  Chloride 101 - 111 mmol/L -  CO2 22 - 32 mmol/L -  Calcium 8.9 - 10.3 mg/dL -  Total Protein 6.0 - 8.5 g/dL 7.6  Total Bilirubin 0.0 - 1.2 mg/dL 0.4  Alkaline Phos 39 - 117 IU/L 37(L)  AST 0 - 40 IU/L 39  ALT 0 - 44 IU/L 39   CBC Latest Ref Rng & Units 04/27/2017  WBC 3.8 - 10.6 K/uL 6.2  Hemoglobin 13.0 - 18.0 g/dL 14.2  Hematocrit 40.0 - 52.0 % 41.6  Platelets 150 - 440 K/uL 272    No images are attached to the encounter.  Chest 1 View  Result Date: 04/16/2017 CLINICAL DATA:  71 year old with new diagnosis of invasive bladder cancer. EXAM: CHEST 1 VIEW COMPARISON:  Visualized lung bases on CT abdomen and pelvis 03/09/2017. No prior chest imaging. FINDINGS: Cardiac silhouette normal in size. Thoracic aorta mildly atherosclerotic. Hilar and mediastinal contours otherwise unremarkable. Lungs clear. Bronchovascular markings normal. Pulmonary vascularity normal. No visible pleural effusions. No pneumothorax. IMPRESSION: No acute cardiopulmonary disease. No evidence of metastatic disease. Electronically Signed   By: Evangeline Dakin M.D.   On: 04/16/2017 15:09   Nm Bone Scan Whole Body  Result Date: 04/10/2017 CLINICAL DATA:  History of bladder carcinoma with bone lesion at T12 noted by MR. EXAM: NUCLEAR MEDICINE WHOLE BODY BONE SCAN TECHNIQUE: Whole body anterior and posterior images were obtained approximately 3 hours after intravenous injection of radiopharmaceutical. RADIOPHARMACEUTICALS:  22.9 mCi Technetium-18m MDP IV COMPARISON:  CT abdomen pelvis of 03/09/2017, and MRI abdomen of 03/30/2017 FINDINGS: The area in question at  T12 by MRI of the abdomen does not show increased uptake of radionuclide by total body bone scan. There is some activity noted in the lower lumbar spine particularly to the right of midline which correlates with degenerative change involving the facet joints. No intense focus of activity is seen to indicate metastatic involvement of the skeleton. IMPRESSION: 1. No abnormality is seen in the region of T12 to suggest a bone metastasis when compared to the prior MRI images. 2. Negative total body bone scan other than probable degenerative change of the facet joints of the lower lumbar spine. Electronically Signed   By: Windy Canny.D.  On: 04/10/2017 14:12   Ir Fluoro Guide Port Insertion Right  Result Date: 04/27/2017 CLINICAL DATA:  Bladder carcinoma and need for porta cath to begin chemotherapy. EXAM: IMPLANTED PORT A CATH PLACEMENT WITH ULTRASOUND AND FLUOROSCOPIC GUIDANCE ANESTHESIA/SEDATION: 2.0 mg IV Versed; 50 mcg IV Fentanyl Total Moderate Sedation Time:  35 minutes The patient's level of consciousness and physiologic status were continuously monitored during the procedure by Radiology nursing. Additional Medications: 2 g IV Ancef. FLUOROSCOPY TIME:  18 seconds.  7.0 mGy. PROCEDURE: The procedure, risks, benefits, and alternatives were explained to the patient. Questions regarding the procedure were encouraged and answered. The patient understands and consents to the procedure. A time-out was performed prior to initiating the procedure. Ultrasound was utilized to confirm patency of the right internal jugular vein. The right neck and chest were prepped with chlorhexidine in a sterile fashion, and a sterile drape was applied covering the operative field. Maximum barrier sterile technique with sterile gowns and gloves were used for the procedure. Local anesthesia was provided with 1% lidocaine. After creating a small venotomy incision, a 21 gauge needle was advanced into the right internal jugular vein  under direct, real-time ultrasound guidance. Ultrasound image documentation was performed. After securing guidewire access, an 8 Fr dilator was placed. A J-wire was kinked to measure appropriate catheter length. A subcutaneous port pocket was then created along the upper chest wall utilizing sharp and blunt dissection. Portable cautery was utilized. The pocket was irrigated with sterile saline. A single lumen power injectable port was chosen for placement. The 8 Fr catheter was tunneled from the port pocket site to the venotomy incision. The port was placed in the pocket. External catheter was trimmed to appropriate length based on guidewire measurement. At the venotomy, an 8 Fr peel-away sheath was placed over a guidewire. The catheter was then placed through the sheath and the sheath removed. Final catheter positioning was confirmed and documented with a fluoroscopic spot image. The port was accessed with a needle and aspirated and flushed with heparinized saline. The access needle was removed. The venotomy and port pocket incisions were closed with subcutaneous 3-0 Monocryl and subcuticular 4-0 Vicryl. Dermabond was applied to both incisions. COMPLICATIONS: COMPLICATIONS None FINDINGS: After catheter placement, the tip lies at the cavo-atrial junction. The catheter aspirates normally and is ready for immediate use. IMPRESSION: Placement of single lumen port a cath via right internal jugular vein. The catheter tip lies at the cavo-atrial junction. A power injectable port a cath was placed and is ready for immediate use. Electronically Signed   By: Aletta Edouard M.D.   On: 04/27/2017 15:54     Assessment and plan- Patient is a 71 y.o. male with neqwly diagnosed muscle invasive bladder cancer Stage II cT2N0cM0 here for on treatment assessment prior toc cyle 1 D1 of cis/ gem chemotherapy  Counts ok to proceed with cycle 1 of chemotherapy. He will be getting split dose cisplatin on D1 and D8. Depending on his  counts we may have to skip D15 chemo and switch to Q3 week cycel instead. He will proceed with C1 D8 of chemotherapy next week and I will see him back in 2 weeks for D15 of gemcitabine.  If he does not have hematuria with cycle 1, we will plan to restart his eliquis.    Visit Diagnosis 1. Malignant neoplasm of posterior wall of urinary bladder (HCC)   2. Encounter for antineoplastic chemotherapy      Dr. Randa Evens, MD, MPH [place] at  [facility] Pager- [phone] 05/01/2017 9:17 AM

## 2017-05-04 ENCOUNTER — Encounter: Payer: Self-pay | Admitting: Urology

## 2017-05-08 ENCOUNTER — Inpatient Hospital Stay: Payer: Medicare Other

## 2017-05-08 ENCOUNTER — Other Ambulatory Visit: Payer: Self-pay | Admitting: Oncology

## 2017-05-08 VITALS — BP 119/85 | HR 94 | Temp 97.6°F | Resp 18

## 2017-05-08 DIAGNOSIS — Z5111 Encounter for antineoplastic chemotherapy: Secondary | ICD-10-CM | POA: Diagnosis not present

## 2017-05-08 DIAGNOSIS — C674 Malignant neoplasm of posterior wall of bladder: Secondary | ICD-10-CM

## 2017-05-08 LAB — COMPREHENSIVE METABOLIC PANEL
ALT: 69 U/L — ABNORMAL HIGH (ref 17–63)
ANION GAP: 6 (ref 5–15)
AST: 43 U/L — ABNORMAL HIGH (ref 15–41)
Albumin: 3.9 g/dL (ref 3.5–5.0)
Alkaline Phosphatase: 29 U/L — ABNORMAL LOW (ref 38–126)
BILIRUBIN TOTAL: 1.1 mg/dL (ref 0.3–1.2)
BUN: 27 mg/dL — ABNORMAL HIGH (ref 6–20)
CO2: 31 mmol/L (ref 22–32)
Calcium: 9 mg/dL (ref 8.9–10.3)
Chloride: 94 mmol/L — ABNORMAL LOW (ref 101–111)
Creatinine, Ser: 1.11 mg/dL (ref 0.61–1.24)
Glucose, Bld: 188 mg/dL — ABNORMAL HIGH (ref 65–99)
POTASSIUM: 3.5 mmol/L (ref 3.5–5.1)
Sodium: 131 mmol/L — ABNORMAL LOW (ref 135–145)
TOTAL PROTEIN: 7.3 g/dL (ref 6.5–8.1)

## 2017-05-08 LAB — CBC WITH DIFFERENTIAL/PLATELET
BASOS PCT: 0 %
Basophils Absolute: 0 10*3/uL (ref 0–0.1)
Eosinophils Absolute: 0.1 10*3/uL (ref 0–0.7)
Eosinophils Relative: 2 %
HEMATOCRIT: 40.6 % (ref 40.0–52.0)
Hemoglobin: 14 g/dL (ref 13.0–18.0)
Lymphocytes Relative: 45 %
Lymphs Abs: 1.6 10*3/uL (ref 1.0–3.6)
MCH: 31.4 pg (ref 26.0–34.0)
MCHC: 34.6 g/dL (ref 32.0–36.0)
MCV: 90.8 fL (ref 80.0–100.0)
MONO ABS: 0.2 10*3/uL (ref 0.2–1.0)
MONOS PCT: 4 %
NEUTROS ABS: 1.7 10*3/uL (ref 1.4–6.5)
Neutrophils Relative %: 49 %
Platelets: 193 10*3/uL (ref 150–440)
RBC: 4.47 MIL/uL (ref 4.40–5.90)
RDW: 13.1 % (ref 11.5–14.5)
WBC: 3.5 10*3/uL — ABNORMAL LOW (ref 3.8–10.6)

## 2017-05-08 MED ORDER — SODIUM CHLORIDE 0.9 % IV SOLN
Freq: Once | INTRAVENOUS | Status: AC
  Administered 2017-05-08: 12:00:00 via INTRAVENOUS
  Filled 2017-05-08: qty 5

## 2017-05-08 MED ORDER — HEPARIN SOD (PORK) LOCK FLUSH 100 UNIT/ML IV SOLN
500.0000 [IU] | Freq: Once | INTRAVENOUS | Status: AC
  Administered 2017-05-08: 500 [IU] via INTRAVENOUS
  Filled 2017-05-08: qty 5

## 2017-05-08 MED ORDER — PALONOSETRON HCL INJECTION 0.25 MG/5ML
0.2500 mg | Freq: Once | INTRAVENOUS | Status: AC
  Administered 2017-05-08: 0.25 mg via INTRAVENOUS
  Filled 2017-05-08: qty 5

## 2017-05-08 MED ORDER — GEMCITABINE HCL CHEMO INJECTION 1 GM/26.3ML
1800.0000 mg | Freq: Once | INTRAVENOUS | Status: AC
  Administered 2017-05-08: 1800 mg via INTRAVENOUS
  Filled 2017-05-08: qty 26.3

## 2017-05-08 MED ORDER — SODIUM CHLORIDE 0.9 % IV SOLN
35.0000 mg/m2 | Freq: Once | INTRAVENOUS | Status: AC
  Administered 2017-05-08: 78 mg via INTRAVENOUS
  Filled 2017-05-08: qty 78

## 2017-05-08 MED ORDER — POTASSIUM CHLORIDE 2 MEQ/ML IV SOLN
Freq: Once | INTRAVENOUS | Status: AC
  Administered 2017-05-08: 10:00:00 via INTRAVENOUS
  Filled 2017-05-08: qty 1000

## 2017-05-08 MED ORDER — SODIUM CHLORIDE 0.9 % IV SOLN
Freq: Once | INTRAVENOUS | Status: AC
  Administered 2017-05-08: 09:00:00 via INTRAVENOUS
  Filled 2017-05-08: qty 1000

## 2017-05-08 MED ORDER — SODIUM CHLORIDE 0.9% FLUSH
10.0000 mL | INTRAVENOUS | Status: DC | PRN
  Administered 2017-05-08: 10 mL via INTRAVENOUS
  Filled 2017-05-08: qty 10

## 2017-05-15 ENCOUNTER — Inpatient Hospital Stay: Payer: Medicare Other

## 2017-05-15 ENCOUNTER — Inpatient Hospital Stay (HOSPITAL_BASED_OUTPATIENT_CLINIC_OR_DEPARTMENT_OTHER): Payer: Medicare Other | Admitting: Oncology

## 2017-05-15 ENCOUNTER — Encounter: Payer: Self-pay | Admitting: Oncology

## 2017-05-15 VITALS — BP 98/67 | HR 84 | Temp 97.9°F | Ht 72.0 in | Wt 202.1 lb

## 2017-05-15 DIAGNOSIS — Z7982 Long term (current) use of aspirin: Secondary | ICD-10-CM | POA: Diagnosis not present

## 2017-05-15 DIAGNOSIS — K219 Gastro-esophageal reflux disease without esophagitis: Secondary | ICD-10-CM | POA: Diagnosis not present

## 2017-05-15 DIAGNOSIS — R319 Hematuria, unspecified: Secondary | ICD-10-CM

## 2017-05-15 DIAGNOSIS — R5381 Other malaise: Secondary | ICD-10-CM | POA: Diagnosis not present

## 2017-05-15 DIAGNOSIS — Z5111 Encounter for antineoplastic chemotherapy: Secondary | ICD-10-CM | POA: Diagnosis not present

## 2017-05-15 DIAGNOSIS — Z79899 Other long term (current) drug therapy: Secondary | ICD-10-CM | POA: Diagnosis not present

## 2017-05-15 DIAGNOSIS — Z87891 Personal history of nicotine dependence: Secondary | ICD-10-CM

## 2017-05-15 DIAGNOSIS — E785 Hyperlipidemia, unspecified: Secondary | ICD-10-CM

## 2017-05-15 DIAGNOSIS — I10 Essential (primary) hypertension: Secondary | ICD-10-CM | POA: Diagnosis not present

## 2017-05-15 DIAGNOSIS — R7303 Prediabetes: Secondary | ICD-10-CM | POA: Diagnosis not present

## 2017-05-15 DIAGNOSIS — C674 Malignant neoplasm of posterior wall of bladder: Secondary | ICD-10-CM

## 2017-05-15 DIAGNOSIS — E876 Hypokalemia: Secondary | ICD-10-CM

## 2017-05-15 DIAGNOSIS — R5383 Other fatigue: Secondary | ICD-10-CM | POA: Diagnosis not present

## 2017-05-15 DIAGNOSIS — I480 Paroxysmal atrial fibrillation: Secondary | ICD-10-CM | POA: Diagnosis not present

## 2017-05-15 DIAGNOSIS — Z8042 Family history of malignant neoplasm of prostate: Secondary | ICD-10-CM

## 2017-05-15 LAB — COMPREHENSIVE METABOLIC PANEL
ALK PHOS: 32 U/L — AB (ref 38–126)
ALT: 59 U/L (ref 17–63)
AST: 34 U/L (ref 15–41)
Albumin: 3.8 g/dL (ref 3.5–5.0)
Anion gap: 9 (ref 5–15)
BILIRUBIN TOTAL: 1.1 mg/dL (ref 0.3–1.2)
BUN: 24 mg/dL — ABNORMAL HIGH (ref 6–20)
CALCIUM: 8.8 mg/dL — AB (ref 8.9–10.3)
CO2: 31 mmol/L (ref 22–32)
CREATININE: 0.98 mg/dL (ref 0.61–1.24)
Chloride: 91 mmol/L — ABNORMAL LOW (ref 101–111)
Glucose, Bld: 194 mg/dL — ABNORMAL HIGH (ref 65–99)
Potassium: 3 mmol/L — ABNORMAL LOW (ref 3.5–5.1)
Sodium: 131 mmol/L — ABNORMAL LOW (ref 135–145)
TOTAL PROTEIN: 7.2 g/dL (ref 6.5–8.1)

## 2017-05-15 LAB — CBC WITH DIFFERENTIAL/PLATELET
Basophils Absolute: 0 10*3/uL (ref 0–0.1)
Basophils Relative: 0 %
Eosinophils Absolute: 0 10*3/uL (ref 0–0.7)
Eosinophils Relative: 0 %
HCT: 36.4 % — ABNORMAL LOW (ref 40.0–52.0)
Hemoglobin: 12.9 g/dL — ABNORMAL LOW (ref 13.0–18.0)
LYMPHS ABS: 1.2 10*3/uL (ref 1.0–3.6)
LYMPHS PCT: 24 %
MCH: 31.7 pg (ref 26.0–34.0)
MCHC: 35.4 g/dL (ref 32.0–36.0)
MCV: 89.4 fL (ref 80.0–100.0)
Monocytes Absolute: 0.1 10*3/uL — ABNORMAL LOW (ref 0.2–1.0)
Monocytes Relative: 3 %
NEUTROS PCT: 73 %
Neutro Abs: 3.5 10*3/uL (ref 1.4–6.5)
Platelets: 99 10*3/uL — ABNORMAL LOW (ref 150–440)
RBC: 4.07 MIL/uL — AB (ref 4.40–5.90)
RDW: 12.8 % (ref 11.5–14.5)
WBC: 4.8 10*3/uL (ref 3.8–10.6)

## 2017-05-15 MED ORDER — SODIUM CHLORIDE 0.9% FLUSH
10.0000 mL | Freq: Once | INTRAVENOUS | Status: AC
  Administered 2017-05-15: 10 mL via INTRAVENOUS
  Filled 2017-05-15: qty 10

## 2017-05-15 MED ORDER — POTASSIUM CHLORIDE CRYS ER 20 MEQ PO TBCR: 20.0000 meq | EXTENDED_RELEASE_TABLET | Freq: Every day | ORAL | 0 refills | Status: DC

## 2017-05-15 MED ORDER — HEPARIN SOD (PORK) LOCK FLUSH 100 UNIT/ML IV SOLN
500.0000 [IU] | Freq: Once | INTRAVENOUS | Status: AC
  Administered 2017-05-15: 500 [IU] via INTRAVENOUS
  Filled 2017-05-15: qty 5

## 2017-05-15 NOTE — Progress Notes (Signed)
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 Patient is a 71 year old male with a past medical history significant for atrial fibrillation for which he was on Eliquis in the past. In August 2017 patient had minor trauma to his abdomen while bone pain and then left hematuria or immediately following that which lasted for about a couple of days. Patient thought that this was likely secondary to trauma . However continued to have intermittent hematuria since then and then had a second major episode of bleeding in March 2018. He spoke to his primary care doctor who suggested to stop Eliquis at this time. However bleeding continued for 2-3 days after stopping Eliquis and hence he was referred to urology [name]  2. Ct abdomen on 03/09/17 showed: IMPRESSION: 1. 5.6 cm polypoid mass arises from the posterior right bladder wall, consistent with urothelial neoplasm. 2. 2 cm exophytic hypoattenuating lesion identified along the uncinate process of the pancreas. Dedicated abdominal MRI without and with contrast may prove helpful to further evaluate.  3. MRI abdomen on 03/30/17 showed: . 1.8 cm simple appearing cystic lesion exophytic from the uncinate process of the pancreas is assisted with other scattered tiny pancreatic parenchymal cyst. Repeat MRI in 12 months is recommended. This recommendation follows ACR consensus guidelines: Management of Incidental Pancreatic Cysts: A White Paper of the ACR Incidental Findings Committee. J  Am Coll Radiol 2355;73:220-254. 2. 1 cm enhancing focus identified in the anterior aspect of the T12 vertebral body. Imaging features are not suggestive of cavernous hemangioma. Given the history of bladder cancer, metastatic involvement cannot be excluded. Bone scan may prove helpful to further evaluate.  4. Bone scan on 04/10/17 showed: IMPRESSION: 1. No abnormality is seen in the region of T12 to suggest a bone metastasis when compared to the prior MRI images. 2. Negative total body bone scan other than probable degenerative change of the facet joints of the lower lumbar spine.  5. CXr was negative for metastatic disease. Patient underwent TURBT on 04/02/17 which showed: DIAGNOSIS:  A. BLADDER TUMOR; TURBT:  - PAPILLARY UROTHELIAL CARCINOMA, INVADING MUSCULARIS PROPRIA,  HIGH-GRADE (WHO/ISUP).  6. Case was discussed at Tumor board and patient was thought to have atleast T2 cN0 muscle invasive bladder cancer. He has started neoadjuvant gem/cis chemotherapy   Interval history- had some diarreha 2 days ago controlled with imodium. Had one episode of blood in stool likely from hemorroids. None since then. Mild nausea. Well controlled  ECOG PS- 0 Pain scale- 0 Opioid associated constipation- 0  Review of systems- Review of Systems  Constitutional: Negative for chills, fever, malaise/fatigue and weight loss.  HENT: Negative for congestion, ear discharge and nosebleeds.   Eyes: Negative for blurred vision.  Respiratory: Negative for cough, hemoptysis, sputum production, shortness of breath and wheezing.   Cardiovascular: Negative for chest pain, palpitations, orthopnea and claudication.  Gastrointestinal: Positive for blood in stool and diarrhea. Negative for abdominal pain, constipation, heartburn, melena, nausea and vomiting.  Genitourinary: Negative for dysuria, flank pain, frequency, hematuria and urgency.  Musculoskeletal: Negative for back pain, joint  pain and myalgias.    Skin: Negative for rash.  Neurological: Negative for dizziness, tingling, focal weakness, seizures, weakness and headaches.  Endo/Heme/Allergies: Does not bruise/bleed easily.  Psychiatric/Behavioral: Negative for depression and suicidal ideas. The patient does not have insomnia.        No Known Allergies   Past Medical History:  Diagnosis Date  . Cancer [facility])    Bladder  . Dysrhythmia    Atrial fib  . GERD (gastroesophageal reflux disease)   . Hyperlipidemia   . Hypertension   . Paroxysmal atrial fibrillation (HCC)   . Pre-diabetes    diet controlled  . Sexual dysfunction      Past Surgical History:  Procedure Laterality Date  . APPENDECTOMY    . COLONOSCOPY WITH PROPOFOL N/A 10/25/2015   Procedure: COLONOSCOPY WITH PROPOFOL;  Surgeon: Manya Silvas, MD;  Location: [facility] ENDOSCOPY;  Service: Endoscopy;  Laterality: N/A;  . IR FLUORO GUIDE PORT INSERTION RIGHT  04/27/2017  . TRANSURETHRAL RESECTION OF BLADDER TUMOR N/A 04/02/2017   Procedure: TRANSURETHRAL RESECTION OF BLADDER TUMOR (TURBT);  Surgeon: Hollice Espy, MD;  Location: [facility] ORS;  Service: Urology;  Laterality: N/A;    Social History   Social History  . Marital status: Married    Spouse name: N/A  . Number of children: N/A  . Years of education: N/A   Occupational History  . Not on file.   Social History Main Topics  . Smoking status: Former Smoker    Packs/day: 1.00    Types: Cigarettes    Quit date: 10/21/1997  . Smokeless tobacco: Never Used  . Alcohol use Yes     Comment: 1-2 glasses of wine daily  . Drug use: No  . Sexual activity: Not on file   Other Topics Concern  . Not on file   Social History Narrative  . No narrative on file    Family History  Problem Relation Age of Onset  . Prostate cancer Father   . Stroke Father   . Atrial fibrillation Father   . Diabetes type II Father   . Kidney cancer Neg Hx      Current Outpatient Prescriptions:  .  apixaban (ELIQUIS) 5 MG  TABS tablet, Take 5 mg by mouth 2 (two) times daily., Disp: , Rfl:  .  dexamethasone (DECADRON) 4 MG tablet, Take 2 tablets by mouth once a day on the day after cisplatin chemotherapy and then take 2 tablets two times a day for 2 days. Take with food., Disp: 30 tablet, Rfl: 1 .  famotidine (PEPCID AC) 10 MG chewable tablet, Chew 10 mg by mouth daily as needed for heartburn., Disp: , Rfl:  .  hydrochlorothiazide (HYDRODIURIL) 25 MG tablet, Take 25 mg by mouth daily., Disp: , Rfl:  .  lidocaine-prilocaine (EMLA) cream, Apply to affected area once, Disp: 30 g, Rfl: 3 .  LORazepam (ATIVAN) 0.5 MG tablet, Take 1 tablet (0.5 mg total) by mouth every 6 (six) hours as needed (Nausea or vomiting)., Disp: 30 tablet, Rfl: 0 .  metoprolol succinate (TOPROL-XL) 50 MG 24 hr tablet, Take 50 mg by mouth every evening. Take with or immediately following a meal. , Disp: , Rfl:  .  omeprazole (PRILOSEC) 20 MG capsule, Take 20 mg by mouth daily as needed (heartburn). , Disp: , Rfl:  .  ondansetron (ZOFRAN) 8 MG tablet, Take 1 tablet (8 mg total) by mouth 2 (two) times daily as needed. Start on the third day after cisplatin chemotherapy., Disp: 30  tablet, Rfl: 1 .  oxymetazoline (AFRIN) 0.05 % nasal spray, Place 1 spray into left nostril at bedtime as needed for congestion., Disp: , Rfl:  .  prochlorperazine (COMPAZINE) 10 MG tablet, Take 1 tablet (10 mg total) by mouth every 6 (six) hours as needed (Nausea or vomiting)., Disp: 30 tablet, Rfl: 1 No current facility-administered medications for this visit.   Facility-Administered Medications Ordered in Other Visits:  .  heparin lock flush 100 unit/mL, 500 Units, Intravenous, Once, Sindy Guadeloupe, MD  Physical exam:  Vitals:   05/15/17 0845  BP: 98/67  Pulse: 84  Temp: 97.9 F (36.6 C)  TempSrc: Tympanic  Weight: 202 lb 1.6 oz (91.7 kg)  Height: 6' (1.829 m)   Physical Exam  Constitutional: He is oriented to person, place, and time and well-developed,  well-nourished, and in no distress.  HENT:  Head: Normocephalic and atraumatic.  Eyes: EOM are normal. Pupils are equal, round, and reactive to light.  Neck: Normal range of motion.  Cardiovascular: Normal rate, regular rhythm and normal heart sounds.   Pulmonary/Chest: Effort normal and breath sounds normal.  Abdominal: Soft. Bowel sounds are normal.  Neurological: He is alert and oriented to person, place, and time.  Skin: Skin is warm and dry.     CMP Latest Ref Rng & Units 05/15/2017  Glucose 65 - 99 mg/dL 194(H)  BUN 6 - 20 mg/dL 24(H)  Creatinine 0.61 - 1.24 mg/dL 0.98  Sodium 135 - 145 mmol/L 131(L)  Potassium 3.5 - 5.1 mmol/L 3.0(L)  Chloride 101 - 111 mmol/L 91(L)  CO2 22 - 32 mmol/L 31  Calcium 8.9 - 10.3 mg/dL 8.8(L)  Total Protein 6.5 - 8.1 g/dL 7.2  Total Bilirubin 0.3 - 1.2 mg/dL 1.1  Alkaline Phos 38 - 126 U/L 32(L)  AST 15 - 41 U/L 34  ALT 17 - 63 U/L 59   CBC Latest Ref Rng & Units 05/15/2017  WBC 3.8 - 10.6 K/uL 4.8  Hemoglobin 13.0 - 18.0 g/dL 12.9(L)  Hematocrit 40.0 - 52.0 % 36.4(L)  Platelets 150 - 440 K/uL 99(L)    No images are attached to the encounter.  Chest 1 View  Result Date: 04/16/2017 CLINICAL DATA:  71 year old with new diagnosis of invasive bladder cancer. EXAM: CHEST 1 VIEW COMPARISON:  Visualized lung bases on CT abdomen and pelvis 03/09/2017. No prior chest imaging. FINDINGS: Cardiac silhouette normal in size. Thoracic aorta mildly atherosclerotic. Hilar and mediastinal contours otherwise unremarkable. Lungs clear. Bronchovascular markings normal. Pulmonary vascularity normal. No visible pleural effusions. No pneumothorax. IMPRESSION: No acute cardiopulmonary disease. No evidence of metastatic disease. Electronically Signed   By: Evangeline Dakin M.D.   On: 04/16/2017 15:09   Ir Fluoro Guide Port Insertion Right  Result Date: 04/27/2017 CLINICAL DATA:  Bladder carcinoma and need for porta cath to begin chemotherapy. EXAM: IMPLANTED PORT A  CATH PLACEMENT WITH ULTRASOUND AND FLUOROSCOPIC GUIDANCE ANESTHESIA/SEDATION: 2.0 mg IV Versed; 50 mcg IV Fentanyl Total Moderate Sedation Time:  35 minutes The patient's level of consciousness and physiologic status were continuously monitored during the procedure by Radiology nursing. Additional Medications: 2 g IV Ancef. FLUOROSCOPY TIME:  18 seconds.  7.0 mGy. PROCEDURE: The procedure, risks, benefits, and alternatives were explained to the patient. Questions regarding the procedure were encouraged and answered. The patient understands and consents to the procedure. A time-out was performed prior to initiating the procedure. Ultrasound was utilized to confirm patency of the right internal jugular vein. The right neck and chest were  prepped with chlorhexidine in a sterile fashion, and a sterile drape was applied covering the operative field. Maximum barrier sterile technique with sterile gowns and gloves were used for the procedure. Local anesthesia was provided with 1% lidocaine. After creating a small venotomy incision, a 21 gauge needle was advanced into the right internal jugular vein under direct, real-time ultrasound guidance. Ultrasound image documentation was performed. After securing guidewire access, an 8 Fr dilator was placed. A J-wire was kinked to measure appropriate catheter length. A subcutaneous port pocket was then created along the upper chest wall utilizing sharp and blunt dissection. Portable cautery was utilized. The pocket was irrigated with sterile saline. A single lumen power injectable port was chosen for placement. The 8 Fr catheter was tunneled from the port pocket site to the venotomy incision. The port was placed in the pocket. External catheter was trimmed to appropriate length based on guidewire measurement. At the venotomy, an 8 Fr peel-away sheath was placed over a guidewire. The catheter was then placed through the sheath and the sheath removed. Final catheter positioning was  confirmed and documented with a fluoroscopic spot image. The port was accessed with a needle and aspirated and flushed with heparinized saline. The access needle was removed. The venotomy and port pocket incisions were closed with subcutaneous 3-0 Monocryl and subcuticular 4-0 Vicryl. Dermabond was applied to both incisions. COMPLICATIONS: COMPLICATIONS None FINDINGS: After catheter placement, the tip lies at the cavo-atrial junction. The catheter aspirates normally and is ready for immediate use. IMPRESSION: Placement of single lumen port a cath via right internal jugular vein. The catheter tip lies at the cavo-atrial junction. A power injectable port a cath was placed and is ready for immediate use. Electronically Signed   By: Aletta Edouard M.D.   On: 04/27/2017 15:54     Assessment and plan- Patient is a 71 y.o.malewith neqwly diagnosed muscle invasive bladder cancer Stage II cT2N0cM0 here for on treatment assessment prior toc cyle 15 D1 of cis/ gem chemotherapy  Patient was supposed to get single agent gemcitabine for day 15 cycle 1 of his chemotherapy. His platelet count is 99 today. I will therefore hold off his gemcitabine. I will switch him to 2 week on and off regimen. On day 1 and day 8 he will get gemcitabine 800 mg/m along with cisplatin 35 mg/m. He will be off chemotherapy  on day 15 and restart cycle again. We will give him chemotherapy off this week and he will come back next week with CBC and metastases seen and get gemcitabine and cisplatin. I will see him back in 2 weeks with labs for C2 D8 of chemotherapy  Hypokalemia- will start PO K 20 meq daily for 1 week   Visit Diagnosis 1. Malignant neoplasm of posterior wall of urinary bladder (HCC)      Dr. Randa Evens, MD, MPH [facility] Pager- [phone] 05/15/2017 9:23 AM

## 2017-05-15 NOTE — Addendum Note (Signed)
Addended by: Luella Cook on: 05/15/2017 11:17 AM   Modules accepted: Orders

## 2017-05-15 NOTE — Progress Notes (Signed)
Patient here for pre treatment check, patient states he is feeling "pretty good". 1 episode of diarrhea.

## 2017-05-22 ENCOUNTER — Inpatient Hospital Stay: Payer: Medicare Other | Attending: Oncology

## 2017-05-22 ENCOUNTER — Inpatient Hospital Stay: Payer: Medicare Other

## 2017-05-22 VITALS — BP 104/70 | HR 98 | Temp 97.2°F | Resp 20

## 2017-05-22 DIAGNOSIS — Z87828 Personal history of other (healed) physical injury and trauma: Secondary | ICD-10-CM | POA: Insufficient documentation

## 2017-05-22 DIAGNOSIS — K219 Gastro-esophageal reflux disease without esophagitis: Secondary | ICD-10-CM | POA: Diagnosis not present

## 2017-05-22 DIAGNOSIS — C674 Malignant neoplasm of posterior wall of bladder: Secondary | ICD-10-CM | POA: Insufficient documentation

## 2017-05-22 DIAGNOSIS — Z87891 Personal history of nicotine dependence: Secondary | ICD-10-CM | POA: Insufficient documentation

## 2017-05-22 DIAGNOSIS — I10 Essential (primary) hypertension: Secondary | ICD-10-CM | POA: Insufficient documentation

## 2017-05-22 DIAGNOSIS — Z79899 Other long term (current) drug therapy: Secondary | ICD-10-CM | POA: Diagnosis not present

## 2017-05-22 DIAGNOSIS — E785 Hyperlipidemia, unspecified: Secondary | ICD-10-CM | POA: Insufficient documentation

## 2017-05-22 DIAGNOSIS — R7303 Prediabetes: Secondary | ICD-10-CM | POA: Insufficient documentation

## 2017-05-22 DIAGNOSIS — I480 Paroxysmal atrial fibrillation: Secondary | ICD-10-CM | POA: Insufficient documentation

## 2017-05-22 DIAGNOSIS — Z7901 Long term (current) use of anticoagulants: Secondary | ICD-10-CM | POA: Insufficient documentation

## 2017-05-22 DIAGNOSIS — Z8042 Family history of malignant neoplasm of prostate: Secondary | ICD-10-CM | POA: Diagnosis not present

## 2017-05-22 DIAGNOSIS — Z5111 Encounter for antineoplastic chemotherapy: Secondary | ICD-10-CM | POA: Diagnosis not present

## 2017-05-22 LAB — COMPREHENSIVE METABOLIC PANEL
ALT: 42 U/L (ref 17–63)
AST: 35 U/L (ref 15–41)
Albumin: 3.5 g/dL (ref 3.5–5.0)
Alkaline Phosphatase: 31 U/L — ABNORMAL LOW (ref 38–126)
Anion gap: 6 (ref 5–15)
BUN: 19 mg/dL (ref 6–20)
CHLORIDE: 100 mmol/L — AB (ref 101–111)
CO2: 30 mmol/L (ref 22–32)
CREATININE: 0.92 mg/dL (ref 0.61–1.24)
Calcium: 9.1 mg/dL (ref 8.9–10.3)
GFR calc non Af Amer: >60 mL/min (ref >60)
Glucose, Bld: 153 mg/dL — ABNORMAL HIGH (ref 65–99)
POTASSIUM: 3.9 mmol/L (ref 3.5–5.1)
SODIUM: 136 mmol/L (ref 135–145)
Total Bilirubin: 0.5 mg/dL (ref 0.3–1.2)
Total Protein: 7 g/dL (ref 6.5–8.1)

## 2017-05-22 LAB — CBC WITH DIFFERENTIAL/PLATELET
BASOS ABS: 0 10*3/uL (ref 0–0.1)
Basophils Relative: 1 %
EOS ABS: 0 10*3/uL (ref 0–0.7)
EOS PCT: 0 %
HCT: 33.2 % — ABNORMAL LOW (ref 40.0–52.0)
Hemoglobin: 11.7 g/dL — ABNORMAL LOW (ref 13.0–18.0)
Lymphocytes Relative: 35 %
Lymphs Abs: 1.4 10*3/uL (ref 1.0–3.6)
MCH: 32.4 pg (ref 26.0–34.0)
MCHC: 35.3 g/dL (ref 32.0–36.0)
MCV: 91.7 fL (ref 80.0–100.0)
MONO ABS: 0.8 10*3/uL (ref 0.2–1.0)
Monocytes Relative: 19 %
Neutro Abs: 1.8 10*3/uL (ref 1.4–6.5)
Neutrophils Relative %: 45 %
PLATELETS: 438 10*3/uL (ref 150–440)
RBC: 3.62 MIL/uL — AB (ref 4.40–5.90)
RDW: 13.2 % (ref 11.5–14.5)
WBC: 4 10*3/uL (ref 3.8–10.6)

## 2017-05-22 MED ORDER — SODIUM CHLORIDE 0.9 % IV SOLN
1800.0000 mg | Freq: Once | INTRAVENOUS | Status: AC
  Administered 2017-05-22: 1800 mg via INTRAVENOUS
  Filled 2017-05-22: qty 21.04

## 2017-05-22 MED ORDER — POTASSIUM CHLORIDE 2 MEQ/ML IV SOLN
Freq: Once | INTRAVENOUS | Status: AC
  Administered 2017-05-22: 10:00:00 via INTRAVENOUS
  Filled 2017-05-22: qty 1000

## 2017-05-22 MED ORDER — SODIUM CHLORIDE 0.9 % IV SOLN
Freq: Once | INTRAVENOUS | Status: AC
  Administered 2017-05-22: 12:00:00 via INTRAVENOUS
  Filled 2017-05-22: qty 5

## 2017-05-22 MED ORDER — PALONOSETRON HCL INJECTION 0.25 MG/5ML
0.2500 mg | Freq: Once | INTRAVENOUS | Status: AC
  Administered 2017-05-22: 0.25 mg via INTRAVENOUS
  Filled 2017-05-22: qty 5

## 2017-05-22 MED ORDER — HEPARIN SOD (PORK) LOCK FLUSH 100 UNIT/ML IV SOLN
500.0000 [IU] | Freq: Once | INTRAVENOUS | Status: AC | PRN
  Administered 2017-05-22: 500 [IU]
  Filled 2017-05-22: qty 5

## 2017-05-22 MED ORDER — SODIUM CHLORIDE 0.9 % IV SOLN
35.0000 mg/m2 | Freq: Once | INTRAVENOUS | Status: AC
  Administered 2017-05-22: 78 mg via INTRAVENOUS
  Filled 2017-05-22: qty 78

## 2017-05-22 MED ORDER — SODIUM CHLORIDE 0.9% FLUSH
10.0000 mL | INTRAVENOUS | Status: DC | PRN
  Administered 2017-05-22: 10 mL
  Filled 2017-05-22: qty 10

## 2017-05-22 MED ORDER — SODIUM CHLORIDE 0.9 % IV SOLN
Freq: Once | INTRAVENOUS | Status: AC
  Administered 2017-05-22: 09:00:00 via INTRAVENOUS
  Filled 2017-05-22: qty 1000

## 2017-05-28 NOTE — Progress Notes (Signed)
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 Patient is a 71 year old male with a past medical history significant for atrial fibrillation for which he was on Eliquis in the past. In August 2017 patient had minor trauma to his abdomen while bone pain and then left hematuria or immediately following that which lasted for about a couple of days. Patient thought that this was likely secondary to trauma . However continued to have intermittent hematuria since then and then had a second major episode of bleeding in March 2018. He spoke to his primary care doctor who suggested to stop Eliquis at this time. However bleeding continued for 2-3 days after stopping Eliquis and hence he was referred to urology [name]  2. Ct abdomen on 03/09/17 showed: IMPRESSION: 1. 5.6 cm polypoid mass arises from the posterior right bladder wall, consistent with urothelial neoplasm. 2. 2 cm exophytic hypoattenuating lesion identified along the uncinate process of the pancreas. Dedicated abdominal MRI without and with contrast may prove helpful to further evaluate.  3. MRI abdomen on 03/30/17 showed: . 1.8 cm simple appearing cystic lesion exophytic from the uncinate process of the pancreas is assisted with other scattered tiny pancreatic parenchymal cyst. Repeat MRI in 12 months is recommended. This recommendation follows ACR consensus guidelines: Management of Incidental Pancreatic Cysts: A White Paper of the ACR Incidental Findings  Committee. J Am Coll Radiol 7782;42:353-614. 2. 1 cm enhancing focus identified in the anterior aspect of the T12 vertebral body. Imaging features are not suggestive of cavernous hemangioma. Given the history of bladder cancer, metastatic involvement cannot be excluded. Bone scan may prove helpful to further evaluate.  4. Bone scan on 04/10/17 showed: IMPRESSION: 1. No abnormality is seen in the region of T12 to suggest a bone metastasis when compared to the prior MRI images. 2. Negative total body bone scan other than probable degenerative change of the facet joints of the lower lumbar spine.  5. CXr was negative for metastatic disease. Patient underwent TURBT on 04/02/17 which showed: DIAGNOSIS:  A. BLADDER TUMOR; TURBT:  - PAPILLARY UROTHELIAL CARCINOMA, INVADING MUSCULARIS PROPRIA,  HIGH-GRADE (WHO/ISUP).  6. Case was discussed at Tumor board and patient was thought to have atleast T2 cN0 muscle invasive bladder cancer. He has started neoadjuvant gem/cis chemotherapy. Since his counts did not hold up for D15 gemzar, plan is to give 2 weeks on 1 week of gem and split dose cisplatin  Interval history- tolerating chemo well. Reports no significant side effects ECOG PS- 0 Pain scale- 0   Review of systems- Review of Systems  Constitutional: Negative for chills, fever, malaise/fatigue and weight loss.  HENT: Negative for congestion, ear discharge and nosebleeds.   Eyes: Negative for blurred vision.  Respiratory: Negative for cough, hemoptysis, sputum production, shortness of breath and wheezing.   Cardiovascular: Negative for chest pain, palpitations, orthopnea and claudication.  Gastrointestinal: Negative for abdominal pain, blood in stool, constipation, diarrhea, heartburn, melena, nausea and vomiting.  Genitourinary: Negative for dysuria, flank pain, frequency, hematuria and urgency.  Musculoskeletal: Negative for back pain, joint pain  and myalgias.  Skin: Negative for rash.    Neurological: Negative for dizziness, tingling, focal weakness, seizures, weakness and headaches.  Endo/Heme/Allergies: Does not bruise/bleed easily.  Psychiatric/Behavioral: Negative for depression and suicidal ideas. The patient does not have insomnia.      No Known Allergies   Past Medical History:  Diagnosis Date  . Cancer [facility])    Bladder  . Dysrhythmia    Atrial fib  . GERD (gastroesophageal reflux disease)   . Hyperlipidemia   . Hypertension   . Paroxysmal atrial fibrillation (HCC)   . Pre-diabetes    diet controlled  . Sexual dysfunction      Past Surgical History:  Procedure Laterality Date  . APPENDECTOMY    . COLONOSCOPY WITH PROPOFOL N/A 10/25/2015   Procedure: COLONOSCOPY WITH PROPOFOL;  Surgeon: Manya Silvas, MD;  Location: [facility] ENDOSCOPY;  Service: Endoscopy;  Laterality: N/A;  . IR FLUORO GUIDE PORT INSERTION RIGHT  04/27/2017  . TRANSURETHRAL RESECTION OF BLADDER TUMOR N/A 04/02/2017   Procedure: TRANSURETHRAL RESECTION OF BLADDER TUMOR (TURBT);  Surgeon: Hollice Espy, MD;  Location: [facility] ORS;  Service: Urology;  Laterality: N/A;    Social History   Social History  . Marital status: Married    Spouse name: N/A  . Number of children: N/A  . Years of education: N/A   Occupational History  . Not on file.   Social History Main Topics  . Smoking status: Former Smoker    Packs/day: 1.00    Types: Cigarettes    Quit date: 10/21/1997  . Smokeless tobacco: Never Used  . Alcohol use Yes     Comment: 1-2 glasses of wine daily  . Drug use: No  . Sexual activity: Not on file   Other Topics Concern  . Not on file   Social History Narrative  . No narrative on file    Family History  Problem Relation Age of Onset  . Prostate cancer Father   . Stroke Father   . Atrial fibrillation Father   . Diabetes type II Father   . Kidney cancer Neg Hx      Current Outpatient Prescriptions:  .  apixaban (ELIQUIS) 5 MG TABS tablet, Take 5 mg by mouth 2  (two) times daily., Disp: , Rfl:  .  dexamethasone (DECADRON) 4 MG tablet, Take 2 tablets by mouth once a day on the day after cisplatin chemotherapy and then take 2 tablets two times a day for 2 days. Take with food., Disp: 30 tablet, Rfl: 1 .  famotidine (PEPCID AC) 10 MG chewable tablet, Chew 10 mg by mouth daily as needed for heartburn., Disp: , Rfl:  .  hydrochlorothiazide (HYDRODIURIL) 25 MG tablet, Take 25 mg by mouth daily., Disp: , Rfl:  .  lidocaine-prilocaine (EMLA) cream, Apply to affected area once, Disp: 30 g, Rfl: 3 .  LORazepam (ATIVAN) 0.5 MG tablet, Take 1 tablet (0.5 mg total) by mouth every 6 (six) hours as needed (Nausea or vomiting)., Disp: 30 tablet, Rfl: 0 .  metoprolol succinate (TOPROL-XL) 50 MG 24 hr tablet, Take 50 mg by mouth every evening. Take with or immediately following a meal. , Disp: , Rfl:  .  omeprazole (PRILOSEC) 20 MG capsule, Take 20 mg by mouth daily as needed (heartburn). , Disp: , Rfl:  .  ondansetron (ZOFRAN) 8 MG tablet, Take 1 tablet (8 mg total) by mouth 2 (two) times daily as needed. Start on the third day after cisplatin chemotherapy., Disp: 30 tablet, Rfl: 1 .  oxymetazoline (AFRIN) 0.05 % nasal spray, Place 1 spray into left nostril at bedtime as needed for congestion., Disp: , Rfl:  .  potassium chloride SA (K-DUR,KLOR-CON) 20 MEQ tablet, Take 1 tablet (20 mEq total) by mouth daily. For 7 days, Disp: 7 tablet, Rfl: 0 .  prochlorperazine (COMPAZINE) 10 MG tablet, Take 1 tablet (10 mg total) by mouth every 6 (six) hours as needed (Nausea or vomiting)., Disp: 30 tablet, Rfl: 1  Physical exam:  Vitals:   05/29/17 0832  BP: 134/86  Pulse: 86  Resp: 18  Temp: 97.4 F (36.3 C)  TempSrc: Tympanic  Weight: 208 lb 6.4 oz (94.5 kg)   Physical Exam  Constitutional: He is oriented to person, place, and time and well-developed, well-nourished, and in no distress.  HENT:  Head: Normocephalic and atraumatic.  Eyes: EOM are normal. Pupils are equal,  round, and reactive to light.  Neck: Normal range of motion.  Cardiovascular: Normal rate, regular rhythm and normal heart sounds.   Pulmonary/Chest: Effort normal and breath sounds normal.  Abdominal: Soft. Bowel sounds are normal.  Neurological: He is alert and oriented to person, place, and time.  Skin: Skin is warm and dry.     CMP Latest Ref Rng & Units 05/22/2017  Glucose 65 - 99 mg/dL 153(H)  BUN 6 - 20 mg/dL 19  Creatinine 0.61 - 1.24 mg/dL 0.92  Sodium 135 - 145 mmol/L 136  Potassium 3.5 - 5.1 mmol/L 3.9  Chloride 101 - 111 mmol/L 100(L)  CO2 22 - 32 mmol/L 30  Calcium 8.9 - 10.3 mg/dL 9.1  Total Protein 6.5 - 8.1 g/dL 7.0  Total Bilirubin 0.3 - 1.2 mg/dL 0.5  Alkaline Phos 38 - 126 U/L 31(L)  AST 15 - 41 U/L 35  ALT 17 - 63 U/L 42   CBC Latest Ref Rng & Units 05/22/2017  WBC 3.8 - 10.6 K/uL 4.0  Hemoglobin 13.0 - 18.0 g/dL 11.7(L)  Hematocrit 40.0 - 52.0 % 33.2(L)  Platelets 150 - 440 K/uL 438      Assessment and plan- Patient is a 71 y.o. male muscle invasive bladder cancer Stage II cT2N0cM0 here for on treatment assessment prior to cycle 2 day 1 of cis/ gem chemotherapy  Counts ok to proceed with cycle 2 day 8 of chemotherapy with gemcitabine/ cisplatin.  I will see him back in 2 weeks for cycle 3 day 1 of chemotherapy with labs   Visit Diagnosis 1. Malignant neoplasm of posterior wall of urinary bladder (HCC)   2. Encounter for antineoplastic chemotherapy      Dr. Randa Evens, MD, MPH [facility] at [facility] Pager- [phone] 05/29/2017 8:45 AM

## 2017-05-29 ENCOUNTER — Inpatient Hospital Stay (HOSPITAL_BASED_OUTPATIENT_CLINIC_OR_DEPARTMENT_OTHER): Payer: Medicare Other | Admitting: Oncology

## 2017-05-29 ENCOUNTER — Inpatient Hospital Stay: Payer: Medicare Other

## 2017-05-29 ENCOUNTER — Encounter: Payer: Self-pay | Admitting: Oncology

## 2017-05-29 VITALS — BP 134/86 | HR 86 | Temp 97.4°F | Resp 18 | Wt 208.4 lb

## 2017-05-29 DIAGNOSIS — I10 Essential (primary) hypertension: Secondary | ICD-10-CM | POA: Diagnosis not present

## 2017-05-29 DIAGNOSIS — C674 Malignant neoplasm of posterior wall of bladder: Secondary | ICD-10-CM | POA: Diagnosis not present

## 2017-05-29 DIAGNOSIS — Z7901 Long term (current) use of anticoagulants: Secondary | ICD-10-CM

## 2017-05-29 DIAGNOSIS — I480 Paroxysmal atrial fibrillation: Secondary | ICD-10-CM | POA: Diagnosis not present

## 2017-05-29 DIAGNOSIS — R7303 Prediabetes: Secondary | ICD-10-CM | POA: Diagnosis not present

## 2017-05-29 DIAGNOSIS — K219 Gastro-esophageal reflux disease without esophagitis: Secondary | ICD-10-CM | POA: Diagnosis not present

## 2017-05-29 DIAGNOSIS — Z5111 Encounter for antineoplastic chemotherapy: Secondary | ICD-10-CM | POA: Diagnosis not present

## 2017-05-29 DIAGNOSIS — Z79899 Other long term (current) drug therapy: Secondary | ICD-10-CM | POA: Diagnosis not present

## 2017-05-29 DIAGNOSIS — Z87891 Personal history of nicotine dependence: Secondary | ICD-10-CM

## 2017-05-29 DIAGNOSIS — E785 Hyperlipidemia, unspecified: Secondary | ICD-10-CM | POA: Diagnosis not present

## 2017-05-29 LAB — CBC WITH DIFFERENTIAL/PLATELET
BASOS PCT: 0 %
Basophils Absolute: 0 10*3/uL (ref 0–0.1)
EOS ABS: 0 10*3/uL (ref 0–0.7)
EOS PCT: 0 %
HCT: 31.9 % — ABNORMAL LOW (ref 40.0–52.0)
Hemoglobin: 11.4 g/dL — ABNORMAL LOW (ref 13.0–18.0)
Lymphocytes Relative: 35 %
Lymphs Abs: 1.8 10*3/uL (ref 1.0–3.6)
MCH: 32.2 pg (ref 26.0–34.0)
MCHC: 35.6 g/dL (ref 32.0–36.0)
MCV: 90.4 fL (ref 80.0–100.0)
MONO ABS: 0.2 10*3/uL (ref 0.2–1.0)
MONOS PCT: 4 %
Neutro Abs: 3.1 10*3/uL (ref 1.4–6.5)
Neutrophils Relative %: 61 %
PLATELETS: 417 10*3/uL (ref 150–440)
RBC: 3.53 MIL/uL — ABNORMAL LOW (ref 4.40–5.90)
RDW: 13.2 % (ref 11.5–14.5)
WBC: 5.1 10*3/uL (ref 3.8–10.6)

## 2017-05-29 LAB — COMPREHENSIVE METABOLIC PANEL
ALBUMIN: 3.5 g/dL (ref 3.5–5.0)
ALT: 37 U/L (ref 17–63)
ANION GAP: 7 (ref 5–15)
AST: 29 U/L (ref 15–41)
Alkaline Phosphatase: 31 U/L — ABNORMAL LOW (ref 38–126)
BUN: 28 mg/dL — ABNORMAL HIGH (ref 6–20)
CHLORIDE: 99 mmol/L — AB (ref 101–111)
CO2: 30 mmol/L (ref 22–32)
Calcium: 9.1 mg/dL (ref 8.9–10.3)
Creatinine, Ser: 0.77 mg/dL (ref 0.61–1.24)
GFR calc Af Amer: >60 mL/min (ref >60)
GFR calc non Af Amer: >60 mL/min (ref >60)
GLUCOSE: 146 mg/dL — AB (ref 65–99)
POTASSIUM: 3.9 mmol/L (ref 3.5–5.1)
SODIUM: 136 mmol/L (ref 135–145)
Total Bilirubin: 0.4 mg/dL (ref 0.3–1.2)
Total Protein: 6.5 g/dL (ref 6.5–8.1)

## 2017-05-29 MED ORDER — SODIUM CHLORIDE 0.9 % IV SOLN
1800.0000 mg | Freq: Once | INTRAVENOUS | Status: AC
  Administered 2017-05-29: 1800 mg via INTRAVENOUS
  Filled 2017-05-29: qty 21.04

## 2017-05-29 MED ORDER — POTASSIUM CHLORIDE 2 MEQ/ML IV SOLN
Freq: Once | INTRAVENOUS | Status: AC
  Administered 2017-05-29: 10:00:00 via INTRAVENOUS
  Filled 2017-05-29: qty 1000

## 2017-05-29 MED ORDER — HEPARIN SOD (PORK) LOCK FLUSH 100 UNIT/ML IV SOLN
500.0000 [IU] | Freq: Once | INTRAVENOUS | Status: AC | PRN
Start: 2017-05-29 — End: 2017-05-29
  Administered 2017-05-29: 500 [IU]

## 2017-05-29 MED ORDER — SODIUM CHLORIDE 0.9% FLUSH
10.0000 mL | INTRAVENOUS | Status: DC | PRN
  Administered 2017-05-29: 10 mL via INTRAVENOUS
  Filled 2017-05-29: qty 10

## 2017-05-29 MED ORDER — SODIUM CHLORIDE 0.9 % IV SOLN
35.0000 mg/m2 | Freq: Once | INTRAVENOUS | Status: AC
  Administered 2017-05-29: 78 mg via INTRAVENOUS
  Filled 2017-05-29: qty 78

## 2017-05-29 MED ORDER — SODIUM CHLORIDE 0.9% FLUSH
10.0000 mL | INTRAVENOUS | Status: DC | PRN
  Filled 2017-05-29: qty 10

## 2017-05-29 MED ORDER — PALONOSETRON HCL INJECTION 0.25 MG/5ML
0.2500 mg | Freq: Once | INTRAVENOUS | Status: AC
  Administered 2017-05-29: 0.25 mg via INTRAVENOUS
  Filled 2017-05-29: qty 5

## 2017-05-29 MED ORDER — HEPARIN SOD (PORK) LOCK FLUSH 100 UNIT/ML IV SOLN
500.0000 [IU] | Freq: Once | INTRAVENOUS | Status: AC
  Filled 2017-05-29: qty 5

## 2017-05-29 MED ORDER — SODIUM CHLORIDE 0.9 % IV SOLN
Freq: Once | INTRAVENOUS | Status: AC
  Administered 2017-05-29: 09:00:00 via INTRAVENOUS
  Filled 2017-05-29: qty 1000

## 2017-05-29 MED ORDER — SODIUM CHLORIDE 0.9 % IV SOLN
Freq: Once | INTRAVENOUS | Status: AC
  Administered 2017-05-29: 11:00:00 via INTRAVENOUS
  Filled 2017-05-29: qty 5

## 2017-05-29 NOTE — Progress Notes (Signed)
Here for follow up. Doing well per pt  

## 2017-06-12 ENCOUNTER — Inpatient Hospital Stay (HOSPITAL_BASED_OUTPATIENT_CLINIC_OR_DEPARTMENT_OTHER): Payer: Medicare Other | Admitting: Oncology

## 2017-06-12 ENCOUNTER — Encounter: Payer: Self-pay | Admitting: Oncology

## 2017-06-12 ENCOUNTER — Inpatient Hospital Stay: Payer: Medicare Other

## 2017-06-12 VITALS — BP 120/76 | HR 102 | Temp 97.6°F | Resp 18 | Wt 213.0 lb

## 2017-06-12 DIAGNOSIS — Z87891 Personal history of nicotine dependence: Secondary | ICD-10-CM | POA: Diagnosis not present

## 2017-06-12 DIAGNOSIS — Z5111 Encounter for antineoplastic chemotherapy: Secondary | ICD-10-CM | POA: Diagnosis not present

## 2017-06-12 DIAGNOSIS — Z7901 Long term (current) use of anticoagulants: Secondary | ICD-10-CM

## 2017-06-12 DIAGNOSIS — I10 Essential (primary) hypertension: Secondary | ICD-10-CM | POA: Diagnosis not present

## 2017-06-12 DIAGNOSIS — R7303 Prediabetes: Secondary | ICD-10-CM

## 2017-06-12 DIAGNOSIS — C674 Malignant neoplasm of posterior wall of bladder: Secondary | ICD-10-CM | POA: Diagnosis not present

## 2017-06-12 DIAGNOSIS — K219 Gastro-esophageal reflux disease without esophagitis: Secondary | ICD-10-CM

## 2017-06-12 DIAGNOSIS — E785 Hyperlipidemia, unspecified: Secondary | ICD-10-CM

## 2017-06-12 DIAGNOSIS — Z87828 Personal history of other (healed) physical injury and trauma: Secondary | ICD-10-CM | POA: Diagnosis not present

## 2017-06-12 DIAGNOSIS — Z79899 Other long term (current) drug therapy: Secondary | ICD-10-CM | POA: Diagnosis not present

## 2017-06-12 DIAGNOSIS — I480 Paroxysmal atrial fibrillation: Secondary | ICD-10-CM | POA: Diagnosis not present

## 2017-06-12 LAB — COMPREHENSIVE METABOLIC PANEL
ALBUMIN: 4 g/dL (ref 3.5–5.0)
ALT: 43 U/L (ref 17–63)
AST: 38 U/L (ref 15–41)
Alkaline Phosphatase: 32 U/L — ABNORMAL LOW (ref 38–126)
Anion gap: 10 (ref 5–15)
BUN: 20 mg/dL (ref 6–20)
CHLORIDE: 102 mmol/L (ref 101–111)
CO2: 26 mmol/L (ref 22–32)
Calcium: 9 mg/dL (ref 8.9–10.3)
Creatinine, Ser: 0.94 mg/dL (ref 0.61–1.24)
GFR calc Af Amer: >60 mL/min (ref >60)
Glucose, Bld: 224 mg/dL — ABNORMAL HIGH (ref 65–99)
POTASSIUM: 3.8 mmol/L (ref 3.5–5.1)
SODIUM: 138 mmol/L (ref 135–145)
Total Bilirubin: 0.6 mg/dL (ref 0.3–1.2)
Total Protein: 7 g/dL (ref 6.5–8.1)

## 2017-06-12 LAB — CBC WITH DIFFERENTIAL/PLATELET
BASOS ABS: 0 10*3/uL (ref 0–0.1)
BASOS PCT: 1 %
EOS PCT: 0 %
Eosinophils Absolute: 0 10*3/uL (ref 0–0.7)
HCT: 32.2 % — ABNORMAL LOW (ref 40.0–52.0)
Hemoglobin: 11.2 g/dL — ABNORMAL LOW (ref 13.0–18.0)
Lymphocytes Relative: 27 %
Lymphs Abs: 1.1 10*3/uL (ref 1.0–3.6)
MCH: 32 pg (ref 26.0–34.0)
MCHC: 34.8 g/dL (ref 32.0–36.0)
MCV: 92.1 fL (ref 80.0–100.0)
MONO ABS: 0.8 10*3/uL (ref 0.2–1.0)
Monocytes Relative: 20 %
Neutro Abs: 2.1 10*3/uL (ref 1.4–6.5)
Neutrophils Relative %: 52 %
PLATELETS: 214 10*3/uL (ref 150–440)
RBC: 3.49 MIL/uL — ABNORMAL LOW (ref 4.40–5.90)
RDW: 15 % — AB (ref 11.5–14.5)
WBC: 4 10*3/uL (ref 3.8–10.6)

## 2017-06-12 MED ORDER — SODIUM CHLORIDE 0.9 % IV SOLN
35.0000 mg/m2 | Freq: Once | INTRAVENOUS | Status: AC
  Administered 2017-06-12: 78 mg via INTRAVENOUS
  Filled 2017-06-12: qty 78

## 2017-06-12 MED ORDER — SODIUM CHLORIDE 0.9% FLUSH
10.0000 mL | INTRAVENOUS | Status: DC | PRN
  Administered 2017-06-12: 10 mL
  Filled 2017-06-12: qty 10

## 2017-06-12 MED ORDER — SODIUM CHLORIDE 0.9 % IV SOLN
Freq: Once | INTRAVENOUS | Status: AC
  Administered 2017-06-12: 09:00:00 via INTRAVENOUS
  Filled 2017-06-12: qty 1000

## 2017-06-12 MED ORDER — HEPARIN SOD (PORK) LOCK FLUSH 100 UNIT/ML IV SOLN
500.0000 [IU] | Freq: Once | INTRAVENOUS | Status: AC
  Administered 2017-06-12: 500 [IU] via INTRAVENOUS
  Filled 2017-06-12: qty 5

## 2017-06-12 MED ORDER — PALONOSETRON HCL INJECTION 0.25 MG/5ML
0.2500 mg | Freq: Once | INTRAVENOUS | Status: AC
  Administered 2017-06-12: 0.25 mg via INTRAVENOUS
  Filled 2017-06-12: qty 5

## 2017-06-12 MED ORDER — SODIUM CHLORIDE 0.9% FLUSH
10.0000 mL | Freq: Once | INTRAVENOUS | Status: AC
  Administered 2017-06-12: 10 mL via INTRAVENOUS
  Filled 2017-06-12: qty 10

## 2017-06-12 MED ORDER — HEPARIN SOD (PORK) LOCK FLUSH 100 UNIT/ML IV SOLN: 500.0000 [IU] | Freq: Once | INTRAVENOUS | Status: DC | PRN

## 2017-06-12 MED ORDER — POTASSIUM CHLORIDE 2 MEQ/ML IV SOLN
Freq: Once | INTRAVENOUS | Status: AC
  Administered 2017-06-12: 10:00:00 via INTRAVENOUS
  Filled 2017-06-12: qty 1000

## 2017-06-12 MED ORDER — GEMCITABINE HCL CHEMO INJECTION 1 GM/26.3ML
1800.0000 mg | Freq: Once | INTRAVENOUS | Status: AC
  Administered 2017-06-12: 1800 mg via INTRAVENOUS
  Filled 2017-06-12: qty 21.04

## 2017-06-12 MED ORDER — FOSAPREPITANT DIMEGLUMINE INJECTION 150 MG
Freq: Once | INTRAVENOUS | Status: AC
  Administered 2017-06-12: 12:00:00 via INTRAVENOUS
  Filled 2017-06-12: qty 5

## 2017-06-12 NOTE — Progress Notes (Signed)
Patient offers no complaints today. 

## 2017-06-12 NOTE — Progress Notes (Signed)
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 Patient is a 71 year old male with a past medical history significant for atrial fibrillation for which he was on Eliquis in the past. In August 2017 patient had minor trauma to his abdomen while bone pain and then left hematuria or immediately following that which lasted for about a couple of days. Patient thought that this was likely secondary to trauma . However continued to have intermittent hematuria since then and then had a second major episode of bleeding in March 2018. He spoke to his primary care doctor who suggested to stop Eliquis at this time. However bleeding continued for 2-3 days after stopping Eliquis and hence he was referred to urology [name]  2. Ct abdomen on 03/09/17 showed: IMPRESSION: 1. 5.6 cm polypoid mass arises from the posterior right bladder wall, consistent with urothelial neoplasm. 2. 2 cm exophytic hypoattenuating lesion identified along the uncinate process of the pancreas. Dedicated abdominal MRI without and with contrast may prove helpful to further evaluate.  3. MRI abdomen on 03/30/17 showed: . 1.8 cm simple appearing cystic lesion exophytic from the uncinate process of the pancreas is assisted with other scattered tiny pancreatic parenchymal cyst. Repeat MRI in 12 months is recommended. This recommendation follows ACR consensus guidelines: Management of Incidental Pancreatic Cysts: A White Paper of the ACR Incidental Findings  Committee. J Am Coll Radiol 7564;33:295-188. 2. 1 cm enhancing focus identified in the anterior aspect of the T12 vertebral body. Imaging features are not suggestive of cavernous hemangioma. Given the history of bladder cancer, metastatic involvement cannot be excluded. Bone scan may prove helpful to further evaluate.  4. Bone scan on 04/10/17 showed: IMPRESSION: 1. No abnormality is seen in the region of T12 to suggest a bone metastasis when compared to the prior MRI images. 2. Negative total body bone scan other than probable degenerative change of the facet joints of the lower lumbar spine.  5. CXr was negative for metastatic disease. Patient underwent TURBT on 04/02/17 which showed: DIAGNOSIS:  A. BLADDER TUMOR; TURBT:  - PAPILLARY UROTHELIAL CARCINOMA, INVADING MUSCULARIS PROPRIA,  HIGH-GRADE (WHO/ISUP).  6. Case was discussed at Tumor board and patient was thought to have atleast T2 cN0 muscle invasive bladder cancer. He has started neoadjuvant gem/cis chemotherapy. Since his counts did not hold up for D15 gemzar, plan is to give 2 weeks on 1 week of gem and split dose cisplatin  Interval history- Toleratign chemo well. No side effects except for mild nausea  ECOG PS- 0 Pain scale- 0   Review of systems- Review of Systems  Constitutional: Negative for chills, fever, malaise/fatigue and weight loss.  HENT: Negative for congestion, ear discharge and nosebleeds.   Eyes: Negative for blurred vision.  Respiratory: Negative for cough, hemoptysis, sputum production, shortness of breath and wheezing.   Cardiovascular: Negative for chest pain, palpitations, orthopnea and claudication.  Gastrointestinal: Negative for abdominal pain, blood in stool, constipation, diarrhea, heartburn, melena, nausea and vomiting.  Genitourinary: Negative for dysuria, flank pain, frequency, hematuria and urgency.  Musculoskeletal: Negative for back pain, joint  pain and myalgias.  Skin: Negative for rash.   Neurological: Negative for dizziness, tingling, focal weakness, seizures, weakness and headaches.  Endo/Heme/Allergies: Does not bruise/bleed easily.  Psychiatric/Behavioral: Negative for depression and suicidal ideas. The patient does not have insomnia.        No Known Allergies   Past Medical History:  Diagnosis Date  . Cancer [facility])    Bladder  . Dysrhythmia    Atrial fib  . GERD (gastroesophageal reflux disease)   . Hyperlipidemia   . Hypertension   . Paroxysmal atrial fibrillation (HCC)   . Pre-diabetes    diet controlled  . Sexual dysfunction      Past Surgical History:  Procedure Laterality Date  . APPENDECTOMY    . COLONOSCOPY WITH PROPOFOL N/A 10/25/2015   Procedure: COLONOSCOPY WITH PROPOFOL;  Surgeon: Manya Silvas, MD;  Location: [facility] ENDOSCOPY;  Service: Endoscopy;  Laterality: N/A;  . IR FLUORO GUIDE PORT INSERTION RIGHT  04/27/2017  . TRANSURETHRAL RESECTION OF BLADDER TUMOR N/A 04/02/2017   Procedure: TRANSURETHRAL RESECTION OF BLADDER TUMOR (TURBT);  Surgeon: Hollice Espy, MD;  Location: [facility] ORS;  Service: Urology;  Laterality: N/A;    Social History   Social History  . Marital status: Married    Spouse name: N/A  . Number of children: N/A  . Years of education: N/A   Occupational History  . Not on file.   Social History Main Topics  . Smoking status: Former Smoker    Packs/day: 1.00    Types: Cigarettes    Quit date: 10/21/1997  . Smokeless tobacco: Never Used  . Alcohol use Yes     Comment: 1-2 glasses of wine daily  . Drug use: No  . Sexual activity: Not on file   Other Topics Concern  . Not on file   Social History Narrative  . No narrative on file    Family History  Problem Relation Age of Onset  . Prostate cancer Father   . Stroke Father   . Atrial fibrillation Father   . Diabetes type II Father   . Kidney cancer Neg Hx      Current Outpatient Prescriptions:  .  apixaban (ELIQUIS) 5 MG TABS tablet, Take 5 mg by  mouth 2 (two) times daily., Disp: , Rfl:  .  dexamethasone (DECADRON) 4 MG tablet, Take 2 tablets by mouth once a day on the day after cisplatin chemotherapy and then take 2 tablets two times a day for 2 days. Take with food., Disp: 30 tablet, Rfl: 1 .  famotidine (PEPCID AC) 10 MG chewable tablet, Chew 10 mg by mouth daily as needed for heartburn., Disp: , Rfl:  .  hydrochlorothiazide (HYDRODIURIL) 25 MG tablet, Take 25 mg by mouth daily., Disp: , Rfl:  .  lidocaine-prilocaine (EMLA) cream, Apply to affected area once, Disp: 30 g, Rfl: 3 .  LORazepam (ATIVAN) 0.5 MG tablet, Take 1 tablet (0.5 mg total) by mouth every 6 (six) hours as needed (Nausea or vomiting). (Patient not taking: Reported on 05/29/2017), Disp: 30 tablet, Rfl: 0 .  metoprolol succinate (TOPROL-XL) 50 MG 24 hr tablet, Take 50 mg by mouth every evening. Take with or immediately following a meal. , Disp: , Rfl:  .  omeprazole (PRILOSEC) 20 MG capsule, Take 20 mg by mouth daily as needed (heartburn). , Disp: , Rfl:  .  ondansetron (ZOFRAN) 8 MG tablet, Take 1 tablet (8 mg total) by mouth 2 (two) times daily as needed. Start on the third day  after cisplatin chemotherapy. (Patient not taking: Reported on 05/29/2017), Disp: 30 tablet, Rfl: 1 .  oxymetazoline (AFRIN) 0.05 % nasal spray, Place 1 spray into left nostril at bedtime as needed for congestion., Disp: , Rfl:  .  prochlorperazine (COMPAZINE) 10 MG tablet, Take 1 tablet (10 mg total) by mouth every 6 (six) hours as needed (Nausea or vomiting)., Disp: 30 tablet, Rfl: 1 No current facility-administered medications for this visit.   Facility-Administered Medications Ordered in Other Visits:  .  heparin lock flush 100 unit/mL, 500 Units, Intravenous, Once, Sindy Guadeloupe, MD  Physical exam:  Vitals:   06/12/17 0851  BP: 120/76  Pulse: (!) 102  Resp: 18  Temp: 97.6 F (36.4 C)  TempSrc: Tympanic  Weight: 213 lb (96.6 kg)   Physical Exam  Constitutional: He is oriented to  person, place, and time and well-developed, well-nourished, and in no distress.  HENT:  Head: Normocephalic and atraumatic.  Eyes: EOM are normal. Pupils are equal, round, and reactive to light.  Neck: Normal range of motion.  Cardiovascular: Normal rate, regular rhythm and normal heart sounds.   Pulmonary/Chest: Effort normal and breath sounds normal.  Abdominal: Soft. Bowel sounds are normal.  Neurological: He is alert and oriented to person, place, and time.  Skin: Skin is warm and dry.     CMP Latest Ref Rng & Units 05/29/2017  Glucose 65 - 99 mg/dL 146(H)  BUN 6 - 20 mg/dL 28(H)  Creatinine 0.61 - 1.24 mg/dL 0.77  Sodium 135 - 145 mmol/L 136  Potassium 3.5 - 5.1 mmol/L 3.9  Chloride 101 - 111 mmol/L 99(L)  CO2 22 - 32 mmol/L 30  Calcium 8.9 - 10.3 mg/dL 9.1  Total Protein 6.5 - 8.1 g/dL 6.5  Total Bilirubin 0.3 - 1.2 mg/dL 0.4  Alkaline Phos 38 - 126 U/L 31(L)  AST 15 - 41 U/L 29  ALT 17 - 63 U/L 37   CBC Latest Ref Rng & Units 05/29/2017  WBC 3.8 - 10.6 K/uL 5.1  Hemoglobin 13.0 - 18.0 g/dL 11.4(L)  Hematocrit 40.0 - 52.0 % 31.9(L)  Platelets 150 - 440 K/uL 417     Assessment and plan- Patient is a 71 y.o. male muscle invasive bladder cancer Stage II cT2N0cM0 here for on treatment assessment prior to cycle 3 day 1 of cisplatin/ gemcitabine chemotherapy  Counts ok to proceed with cycle 3 day 1 of chemotherapy with gemcitabine/ cisplatin.  I will see him back in 2 weeks for cycle 4 day 1 of chemotherapy with labs. I will plan to give him 4 cycles of treatment and then he will proceed for cystectomy. He will be seeing Dr. Tammi Klippel soon. I will touch base with [name] about this as well      Visit Diagnosis 1. Malignant neoplasm of posterior wall of urinary bladder (HCC)   2. Encounter for antineoplastic chemotherapy      Dr. Randa Evens, MD, MPH [facility] at [facility] Pager- [phone] 06/12/2017 9:08 AM

## 2017-06-19 ENCOUNTER — Inpatient Hospital Stay: Payer: Medicare Other | Attending: Oncology

## 2017-06-19 ENCOUNTER — Inpatient Hospital Stay: Payer: Medicare Other

## 2017-06-19 VITALS — BP 106/75 | HR 77 | Resp 18

## 2017-06-19 DIAGNOSIS — C674 Malignant neoplasm of posterior wall of bladder: Secondary | ICD-10-CM

## 2017-06-19 DIAGNOSIS — E785 Hyperlipidemia, unspecified: Secondary | ICD-10-CM | POA: Diagnosis not present

## 2017-06-19 DIAGNOSIS — Z87891 Personal history of nicotine dependence: Secondary | ICD-10-CM | POA: Diagnosis not present

## 2017-06-19 DIAGNOSIS — Z79899 Other long term (current) drug therapy: Secondary | ICD-10-CM | POA: Insufficient documentation

## 2017-06-19 DIAGNOSIS — K862 Cyst of pancreas: Secondary | ICD-10-CM | POA: Diagnosis not present

## 2017-06-19 DIAGNOSIS — R7303 Prediabetes: Secondary | ICD-10-CM | POA: Insufficient documentation

## 2017-06-19 DIAGNOSIS — I10 Essential (primary) hypertension: Secondary | ICD-10-CM | POA: Diagnosis not present

## 2017-06-19 DIAGNOSIS — K219 Gastro-esophageal reflux disease without esophagitis: Secondary | ICD-10-CM | POA: Insufficient documentation

## 2017-06-19 DIAGNOSIS — Z7901 Long term (current) use of anticoagulants: Secondary | ICD-10-CM | POA: Insufficient documentation

## 2017-06-19 DIAGNOSIS — D6481 Anemia due to antineoplastic chemotherapy: Secondary | ICD-10-CM | POA: Insufficient documentation

## 2017-06-19 DIAGNOSIS — R110 Nausea: Secondary | ICD-10-CM | POA: Diagnosis not present

## 2017-06-19 DIAGNOSIS — Z5111 Encounter for antineoplastic chemotherapy: Secondary | ICD-10-CM | POA: Diagnosis not present

## 2017-06-19 DIAGNOSIS — Z8042 Family history of malignant neoplasm of prostate: Secondary | ICD-10-CM | POA: Diagnosis not present

## 2017-06-19 DIAGNOSIS — I480 Paroxysmal atrial fibrillation: Secondary | ICD-10-CM | POA: Diagnosis not present

## 2017-06-19 DIAGNOSIS — T451X5S Adverse effect of antineoplastic and immunosuppressive drugs, sequela: Secondary | ICD-10-CM | POA: Diagnosis not present

## 2017-06-19 LAB — CBC WITH DIFFERENTIAL/PLATELET
Basophils Absolute: 0 10*3/uL (ref 0–0.1)
Basophils Relative: 1 %
EOS ABS: 0 10*3/uL (ref 0–0.7)
Eosinophils Relative: 0 %
HEMATOCRIT: 34 % — AB (ref 40.0–52.0)
HEMOGLOBIN: 12.1 g/dL — AB (ref 13.0–18.0)
LYMPHS ABS: 1.3 10*3/uL (ref 1.0–3.6)
Lymphocytes Relative: 34 %
MCH: 32.1 pg (ref 26.0–34.0)
MCHC: 35.4 g/dL (ref 32.0–36.0)
MCV: 90.7 fL (ref 80.0–100.0)
MONOS PCT: 8 %
Monocytes Absolute: 0.3 10*3/uL (ref 0.2–1.0)
NEUTROS ABS: 2.1 10*3/uL (ref 1.4–6.5)
NEUTROS PCT: 57 %
Platelets: 298 10*3/uL (ref 150–440)
RBC: 3.75 MIL/uL — ABNORMAL LOW (ref 4.40–5.90)
RDW: 15 % — ABNORMAL HIGH (ref 11.5–14.5)
WBC: 3.7 10*3/uL — ABNORMAL LOW (ref 3.8–10.6)

## 2017-06-19 LAB — COMPREHENSIVE METABOLIC PANEL
ALK PHOS: 35 U/L — AB (ref 38–126)
ALT: 56 U/L (ref 17–63)
ANION GAP: 8 (ref 5–15)
AST: 37 U/L (ref 15–41)
Albumin: 4.1 g/dL (ref 3.5–5.0)
BUN: 24 mg/dL — ABNORMAL HIGH (ref 6–20)
CALCIUM: 9.4 mg/dL (ref 8.9–10.3)
CO2: 30 mmol/L (ref 22–32)
Chloride: 98 mmol/L — ABNORMAL LOW (ref 101–111)
Creatinine, Ser: 0.92 mg/dL (ref 0.61–1.24)
GFR calc non Af Amer: >60 mL/min (ref >60)
Glucose, Bld: 259 mg/dL — ABNORMAL HIGH (ref 65–99)
Potassium: 3.9 mmol/L (ref 3.5–5.1)
Sodium: 136 mmol/L (ref 135–145)
Total Bilirubin: 0.5 mg/dL (ref 0.3–1.2)
Total Protein: 7.3 g/dL (ref 6.5–8.1)

## 2017-06-19 MED ORDER — SODIUM CHLORIDE 0.9 % IV SOLN
35.0000 mg/m2 | Freq: Once | INTRAVENOUS | Status: AC
  Administered 2017-06-19: 78 mg via INTRAVENOUS
  Filled 2017-06-19: qty 78

## 2017-06-19 MED ORDER — SODIUM CHLORIDE 0.9 % IV SOLN
1800.0000 mg | Freq: Once | INTRAVENOUS | Status: AC
  Administered 2017-06-19: 1800 mg via INTRAVENOUS
  Filled 2017-06-19: qty 26.3

## 2017-06-19 MED ORDER — POTASSIUM CHLORIDE 2 MEQ/ML IV SOLN
Freq: Once | INTRAVENOUS | Status: AC
  Administered 2017-06-19: 10:00:00 via INTRAVENOUS
  Filled 2017-06-19: qty 1000

## 2017-06-19 MED ORDER — SODIUM CHLORIDE 0.9 % IV SOLN
Freq: Once | INTRAVENOUS | Status: AC
  Administered 2017-06-19: 09:00:00 via INTRAVENOUS
  Filled 2017-06-19: qty 1000

## 2017-06-19 MED ORDER — PALONOSETRON HCL INJECTION 0.25 MG/5ML
0.2500 mg | Freq: Once | INTRAVENOUS | Status: AC
  Administered 2017-06-19: 0.25 mg via INTRAVENOUS

## 2017-06-19 MED ORDER — FOSAPREPITANT DIMEGLUMINE INJECTION 150 MG
Freq: Once | INTRAVENOUS | Status: AC
  Administered 2017-06-19: 12:00:00 via INTRAVENOUS
  Filled 2017-06-19: qty 5

## 2017-06-19 NOTE — Progress Notes (Signed)
Dr. Janese Banks reviewed labs, proceed with treatment today per Dr. Janese Banks.

## 2017-07-03 ENCOUNTER — Inpatient Hospital Stay: Payer: Medicare Other

## 2017-07-03 ENCOUNTER — Encounter: Payer: Self-pay | Admitting: Oncology

## 2017-07-03 ENCOUNTER — Inpatient Hospital Stay (HOSPITAL_BASED_OUTPATIENT_CLINIC_OR_DEPARTMENT_OTHER): Payer: Medicare Other | Admitting: Oncology

## 2017-07-03 VITALS — BP 109/71 | HR 92 | Temp 97.3°F | Resp 18 | Wt 209.3 lb

## 2017-07-03 DIAGNOSIS — K862 Cyst of pancreas: Secondary | ICD-10-CM | POA: Diagnosis not present

## 2017-07-03 DIAGNOSIS — I480 Paroxysmal atrial fibrillation: Secondary | ICD-10-CM

## 2017-07-03 DIAGNOSIS — Z79899 Other long term (current) drug therapy: Secondary | ICD-10-CM

## 2017-07-03 DIAGNOSIS — Z7901 Long term (current) use of anticoagulants: Secondary | ICD-10-CM | POA: Diagnosis not present

## 2017-07-03 DIAGNOSIS — Z8042 Family history of malignant neoplasm of prostate: Secondary | ICD-10-CM

## 2017-07-03 DIAGNOSIS — T451X5S Adverse effect of antineoplastic and immunosuppressive drugs, sequela: Secondary | ICD-10-CM

## 2017-07-03 DIAGNOSIS — R7303 Prediabetes: Secondary | ICD-10-CM

## 2017-07-03 DIAGNOSIS — C674 Malignant neoplasm of posterior wall of bladder: Secondary | ICD-10-CM | POA: Diagnosis not present

## 2017-07-03 DIAGNOSIS — R110 Nausea: Secondary | ICD-10-CM

## 2017-07-03 DIAGNOSIS — Z5111 Encounter for antineoplastic chemotherapy: Secondary | ICD-10-CM | POA: Diagnosis not present

## 2017-07-03 DIAGNOSIS — Z87891 Personal history of nicotine dependence: Secondary | ICD-10-CM

## 2017-07-03 DIAGNOSIS — D6481 Anemia due to antineoplastic chemotherapy: Secondary | ICD-10-CM

## 2017-07-03 DIAGNOSIS — K219 Gastro-esophageal reflux disease without esophagitis: Secondary | ICD-10-CM

## 2017-07-03 DIAGNOSIS — E785 Hyperlipidemia, unspecified: Secondary | ICD-10-CM | POA: Diagnosis not present

## 2017-07-03 DIAGNOSIS — I10 Essential (primary) hypertension: Secondary | ICD-10-CM

## 2017-07-03 LAB — COMPREHENSIVE METABOLIC PANEL
ALK PHOS: 31 U/L — AB (ref 38–126)
ALT: 51 U/L (ref 17–63)
AST: 34 U/L (ref 15–41)
Albumin: 4 g/dL (ref 3.5–5.0)
Anion gap: 6 (ref 5–15)
BILIRUBIN TOTAL: 0.5 mg/dL (ref 0.3–1.2)
BUN: 24 mg/dL — AB (ref 6–20)
CALCIUM: 9.4 mg/dL (ref 8.9–10.3)
CO2: 30 mmol/L (ref 22–32)
CREATININE: 1.1 mg/dL (ref 0.61–1.24)
Chloride: 100 mmol/L — ABNORMAL LOW (ref 101–111)
GFR calc Af Amer: >60 mL/min (ref >60)
Glucose, Bld: 159 mg/dL — ABNORMAL HIGH (ref 65–99)
POTASSIUM: 3.9 mmol/L (ref 3.5–5.1)
Sodium: 136 mmol/L (ref 135–145)
TOTAL PROTEIN: 7 g/dL (ref 6.5–8.1)

## 2017-07-03 LAB — CBC WITH DIFFERENTIAL/PLATELET
BASOS ABS: 0 10*3/uL (ref 0–0.1)
BASOS PCT: 1 %
EOS ABS: 0 10*3/uL (ref 0–0.7)
Eosinophils Relative: 1 %
HEMATOCRIT: 30.9 % — AB (ref 40.0–52.0)
Hemoglobin: 10.8 g/dL — ABNORMAL LOW (ref 13.0–18.0)
Lymphocytes Relative: 31 %
Lymphs Abs: 1.2 10*3/uL (ref 1.0–3.6)
MCH: 32.2 pg (ref 26.0–34.0)
MCHC: 35.1 g/dL (ref 32.0–36.0)
MCV: 91.8 fL (ref 80.0–100.0)
MONO ABS: 0.9 10*3/uL (ref 0.2–1.0)
Monocytes Relative: 24 %
NEUTROS ABS: 1.6 10*3/uL (ref 1.4–6.5)
NEUTROS PCT: 43 %
Platelets: 259 10*3/uL (ref 150–440)
RBC: 3.36 MIL/uL — ABNORMAL LOW (ref 4.40–5.90)
RDW: 18.7 % — AB (ref 11.5–14.5)
WBC: 3.8 10*3/uL (ref 3.8–10.6)

## 2017-07-03 MED ORDER — PALONOSETRON HCL INJECTION 0.25 MG/5ML
0.2500 mg | Freq: Once | INTRAVENOUS | Status: AC
  Administered 2017-07-03: 0.25 mg via INTRAVENOUS
  Filled 2017-07-03: qty 5

## 2017-07-03 MED ORDER — FOSAPREPITANT DIMEGLUMINE INJECTION 150 MG
Freq: Once | INTRAVENOUS | Status: AC
  Administered 2017-07-03: 12:00:00 via INTRAVENOUS
  Filled 2017-07-03: qty 5

## 2017-07-03 MED ORDER — HEPARIN SOD (PORK) LOCK FLUSH 100 UNIT/ML IV SOLN
500.0000 [IU] | Freq: Once | INTRAVENOUS | Status: AC
  Administered 2017-07-03: 500 [IU] via INTRAVENOUS
  Filled 2017-07-03: qty 5

## 2017-07-03 MED ORDER — SODIUM CHLORIDE 0.9 % IV SOLN
1800.0000 mg | Freq: Once | INTRAVENOUS | Status: AC
  Administered 2017-07-03: 1800 mg via INTRAVENOUS
  Filled 2017-07-03: qty 26.3

## 2017-07-03 MED ORDER — SODIUM CHLORIDE 0.9 % IV SOLN
Freq: Once | INTRAVENOUS | Status: AC
  Administered 2017-07-03: 10:00:00 via INTRAVENOUS
  Filled 2017-07-03: qty 1000

## 2017-07-03 MED ORDER — POTASSIUM CHLORIDE 2 MEQ/ML IV SOLN
Freq: Once | INTRAVENOUS | Status: AC
  Administered 2017-07-03: 10:00:00 via INTRAVENOUS
  Filled 2017-07-03: qty 1000

## 2017-07-03 MED ORDER — POTASSIUM CHLORIDE 2 MEQ/ML IV SOLN: Freq: Once | INTRAVENOUS | Status: DC

## 2017-07-03 MED ORDER — SODIUM CHLORIDE 0.9% FLUSH
10.0000 mL | INTRAVENOUS | Status: DC | PRN
Start: 2017-07-03 — End: 2017-07-03
  Administered 2017-07-03: 10 mL via INTRAVENOUS
  Filled 2017-07-03: qty 10

## 2017-07-03 MED ORDER — SODIUM CHLORIDE 0.9 % IV SOLN
35.0000 mg/m2 | Freq: Once | INTRAVENOUS | Status: AC
  Administered 2017-07-03: 78 mg via INTRAVENOUS
  Filled 2017-07-03: qty 78

## 2017-07-03 NOTE — Progress Notes (Signed)
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 Patient is a 71 year old male with a past medical history significant for atrial fibrillation for which he was on Eliquis in the past. In August 2017 patient had minor trauma to his abdomen while bone pain and then left hematuria or immediately following that which lasted for about a couple of days. Patient thought that this was likely secondary to trauma . However continued to have intermittent hematuria since then and then had a second major episode of bleeding in March 2018. He spoke to his primary care doctor who suggested to stop Eliquis at this time. However bleeding continued for 2-3 days after stopping Eliquis and hence he was referred to urology [name]  2. Ct abdomen on 03/09/17 showed: IMPRESSION: 1. 5.6 cm polypoid mass arises from the posterior right bladder wall, consistent with urothelial neoplasm. 2. 2 cm exophytic hypoattenuating lesion identified along the uncinate process of the pancreas. Dedicated abdominal MRI without and with contrast may prove helpful to further evaluate.  3. MRI abdomen on 03/30/17 showed: . 1.8 cm simple appearing cystic lesion exophytic from the uncinate process of the pancreas is assisted with other scattered tiny pancreatic parenchymal cyst. Repeat MRI in 12 months is recommended. This recommendation follows ACR consensus guidelines: Management of Incidental Pancreatic Cysts: A White Paper of the ACR Incidental Findings  Committee. J Am Coll Radiol 4401;02:725-366. 2. 1 cm enhancing focus identified in the anterior aspect of the T12 vertebral body. Imaging features are not suggestive of cavernous hemangioma. Given the history of bladder cancer, metastatic involvement cannot be excluded. Bone scan may prove helpful to further evaluate.  4. Bone scan on 04/10/17 showed: IMPRESSION: 1. No abnormality is seen in the region of T12 to suggest a bone metastasis when compared to the prior MRI images. 2. Negative total body bone scan other than probable degenerative change of the facet joints of the lower lumbar spine.  5. CXr was negative for metastatic disease. Patient underwent TURBT on 04/02/17 which showed: DIAGNOSIS:  A. BLADDER TUMOR; TURBT:  - PAPILLARY UROTHELIAL CARCINOMA, INVADING MUSCULARIS PROPRIA,  HIGH-GRADE (WHO/ISUP).  6. Case was discussed at Tumor board and patient was thought to have atleast T2 cN0 muscle invasive bladder cancer. He has started neoadjuvant gem/cis chemotherapy. Since his counts did not hold up for D15 gemzar, plan is to give 2 weeks on 1 week of gem and split dose cisplatin   Interval history- mild nausea. Denies other complaints  ECOG PS- 0 Pain scale- 0   Review of systems- Review of Systems  Constitutional: Negative for chills, fever, malaise/fatigue and weight loss.  HENT: Negative for congestion, ear discharge and nosebleeds.   Eyes: Negative for blurred vision.  Respiratory: Negative for cough, hemoptysis, sputum production, shortness of breath and wheezing.   Cardiovascular: Negative for chest pain, palpitations, orthopnea and claudication.  Gastrointestinal: Negative for abdominal pain, blood in stool, constipation, diarrhea, heartburn, melena, nausea and vomiting.  Genitourinary: Negative for dysuria, flank pain, frequency, hematuria and urgency.  Musculoskeletal: Negative for back pain, joint pain and myalgias.  Skin: Negative  for rash.  Neurological:  Negative for dizziness, tingling, focal weakness, seizures, weakness and headaches.  Endo/Heme/Allergies: Does not bruise/bleed easily.  Psychiatric/Behavioral: Negative for depression and suicidal ideas. The patient does not have insomnia.        No Known Allergies   Past Medical History:  Diagnosis Date  . Cancer [facility])    Bladder  . Dysrhythmia    Atrial fib  . GERD (gastroesophageal reflux disease)   . Hyperlipidemia   . Hypertension   . Paroxysmal atrial fibrillation (HCC)   . Pre-diabetes    diet controlled  . Sexual dysfunction      Past Surgical History:  Procedure Laterality Date  . APPENDECTOMY    . COLONOSCOPY WITH PROPOFOL N/A 10/25/2015   Procedure: COLONOSCOPY WITH PROPOFOL;  Surgeon: Manya Silvas, MD;  Location: [facility] ENDOSCOPY;  Service: Endoscopy;  Laterality: N/A;  . IR FLUORO GUIDE PORT INSERTION RIGHT  04/27/2017  . TRANSURETHRAL RESECTION OF BLADDER TUMOR N/A 04/02/2017   Procedure: TRANSURETHRAL RESECTION OF BLADDER TUMOR (TURBT);  Surgeon: Hollice Espy, MD;  Location: [facility] ORS;  Service: Urology;  Laterality: N/A;    Social History   Social History  . Marital status: Married    Spouse name: N/A  . Number of children: N/A  . Years of education: N/A   Occupational History  . Not on file.   Social History Main Topics  . Smoking status: Former Smoker    Packs/day: 1.00    Types: Cigarettes    Quit date: 10/21/1997  . Smokeless tobacco: Never Used  . Alcohol use Yes     Comment: 1-2 glasses of wine daily  . Drug use: No  . Sexual activity: Not on file   Other Topics Concern  . Not on file   Social History Narrative  . No narrative on file    Family History  Problem Relation Age of Onset  . Prostate cancer Father   . Stroke Father   . Atrial fibrillation Father   . Diabetes type II Father   . Kidney cancer Neg Hx      Current Outpatient Prescriptions:  .  apixaban (ELIQUIS) 5 MG TABS tablet, Take 5 mg by mouth 2 (two) times  daily., Disp: , Rfl:  .  dexamethasone (DECADRON) 4 MG tablet, Take 2 tablets by mouth once a day on the day after cisplatin chemotherapy and then take 2 tablets two times a day for 2 days. Take with food., Disp: 30 tablet, Rfl: 1 .  famotidine (PEPCID AC) 10 MG chewable tablet, Chew 10 mg by mouth daily as needed for heartburn., Disp: , Rfl:  .  hydrochlorothiazide (HYDRODIURIL) 25 MG tablet, Take 25 mg by mouth daily., Disp: , Rfl:  .  lidocaine-prilocaine (EMLA) cream, Apply to affected area once, Disp: 30 g, Rfl: 3 .  LORazepam (ATIVAN) 0.5 MG tablet, Take 1 tablet (0.5 mg total) by mouth every 6 (six) hours as needed (Nausea or vomiting)., Disp: 30 tablet, Rfl: 0 .  metoprolol succinate (TOPROL-XL) 50 MG 24 hr tablet, Take 50 mg by mouth every evening. Take with or immediately following a meal. , Disp: , Rfl:  .  omeprazole (PRILOSEC) 20 MG capsule, Take 20 mg by mouth daily as needed (heartburn). , Disp: , Rfl:  .  ondansetron (ZOFRAN) 8 MG tablet, Take 1 tablet (8 mg total) by mouth 2 (two) times daily as needed. Start on the third day after cisplatin chemotherapy. (Patient not taking: Reported on 05/29/2017), Disp: 30 tablet,  Rfl: 1 .  oxymetazoline (AFRIN) 0.05 % nasal spray, Place 1 spray into left nostril at bedtime as needed for congestion., Disp: , Rfl:  .  prochlorperazine (COMPAZINE) 10 MG tablet, Take 1 tablet (10 mg total) by mouth every 6 (six) hours as needed (Nausea or vomiting)., Disp: 30 tablet, Rfl: 1  Physical exam:  Vitals:   07/03/17 0909  BP: 109/71  Pulse: 92  Resp: 18  Temp: (!) 97.3 F (36.3 C)  TempSrc: Tympanic  Weight: 209 lb 4.8 oz (94.9 kg)   Physical Exam  Constitutional: He is oriented to person, place, and time and well-developed, well-nourished, and in no distress.  HENT:  Head: Normocephalic and atraumatic.  Eyes: Pupils are equal, round, and reactive to light. EOM are normal.  Neck: Normal range of motion.  Cardiovascular: Normal rate, regular  rhythm and normal heart sounds.   Pulmonary/Chest: Effort normal and breath sounds normal.  Abdominal: Soft. Bowel sounds are normal.  Neurological: He is alert and oriented to person, place, and time.  Skin: Skin is warm and dry.     CMP Latest Ref Rng & Units 06/19/2017  Glucose 65 - 99 mg/dL 259(H)  BUN 6 - 20 mg/dL 24(H)  Creatinine 0.61 - 1.24 mg/dL 0.92  Sodium 135 - 145 mmol/L 136  Potassium 3.5 - 5.1 mmol/L 3.9  Chloride 101 - 111 mmol/L 98(L)  CO2 22 - 32 mmol/L 30  Calcium 8.9 - 10.3 mg/dL 9.4  Total Protein 6.5 - 8.1 g/dL 7.3  Total Bilirubin 0.3 - 1.2 mg/dL 0.5  Alkaline Phos 38 - 126 U/L 35(L)  AST 15 - 41 U/L 37  ALT 17 - 63 U/L 56   CBC Latest Ref Rng & Units 06/19/2017  WBC 3.8 - 10.6 K/uL 3.7(L)  Hemoglobin 13.0 - 18.0 g/dL 12.1(L)  Hematocrit 40.0 - 52.0 % 34.0(L)  Platelets 150 - 440 K/uL 298     Assessment and plan- Patient is a 71 y.o. male muscle invasive bladder cancer Stage II cT2N0cM0 herefor on treatment assessment prior to cycle 4 day 1of cisplatin/ gemcitabine chemotherapy  Counts ok to proceed with cycle 4 day 1 of gem/cis chemotherapy. He will proceed with day 8 chemotherapy next week. I will plan to repeat ct abdomen pelvis with contrast in 2 weeks from now and see him after that. Depending upon timing of his surgery and response on scans, I will decide if we can stop at 4 cycles or complete 6 cycles of chemotherapy. He will be seeing DR. Manny on 07/30/17 to discuss surgery  Chemo induced anemia- continue to monitor   Visit Diagnosis 1. Malignant neoplasm of posterior wall of urinary bladder (HCC)   2. Encounter for antineoplastic chemotherapy      Dr. Randa Evens, MD, MPH [facility] at [facility] Pager- [phone] 07/03/2017 11:10 AM

## 2017-07-03 NOTE — Progress Notes (Signed)
Here for follow up

## 2017-07-10 ENCOUNTER — Inpatient Hospital Stay: Payer: Medicare Other

## 2017-07-10 VITALS — BP 123/79 | HR 105 | Temp 97.8°F | Resp 18

## 2017-07-10 DIAGNOSIS — Z5111 Encounter for antineoplastic chemotherapy: Secondary | ICD-10-CM | POA: Diagnosis not present

## 2017-07-10 DIAGNOSIS — C674 Malignant neoplasm of posterior wall of bladder: Secondary | ICD-10-CM

## 2017-07-10 LAB — COMPREHENSIVE METABOLIC PANEL
ALBUMIN: 4 g/dL (ref 3.5–5.0)
ALK PHOS: 30 U/L — AB (ref 38–126)
ALT: 72 U/L — ABNORMAL HIGH (ref 17–63)
ANION GAP: 8 (ref 5–15)
AST: 41 U/L (ref 15–41)
BUN: 32 mg/dL — ABNORMAL HIGH (ref 6–20)
CALCIUM: 9.3 mg/dL (ref 8.9–10.3)
CO2: 27 mmol/L (ref 22–32)
CREATININE: 0.99 mg/dL (ref 0.61–1.24)
Chloride: 100 mmol/L — ABNORMAL LOW (ref 101–111)
GFR calc Af Amer: >60 mL/min (ref >60)
GFR calc non Af Amer: >60 mL/min (ref >60)
GLUCOSE: 153 mg/dL — AB (ref 65–99)
Potassium: 3.8 mmol/L (ref 3.5–5.1)
SODIUM: 135 mmol/L (ref 135–145)
Total Bilirubin: 0.5 mg/dL (ref 0.3–1.2)
Total Protein: 6.8 g/dL (ref 6.5–8.1)

## 2017-07-10 LAB — CBC WITH DIFFERENTIAL/PLATELET
Basophils Absolute: 0 10*3/uL (ref 0–0.1)
Basophils Relative: 1 %
EOS ABS: 0 10*3/uL (ref 0–0.7)
Eosinophils Relative: 0 %
HCT: 30.2 % — ABNORMAL LOW (ref 40.0–52.0)
HEMOGLOBIN: 10.8 g/dL — AB (ref 13.0–18.0)
Lymphocytes Relative: 41 %
Lymphs Abs: 1.4 10*3/uL (ref 1.0–3.6)
MCH: 33 pg (ref 26.0–34.0)
MCHC: 35.8 g/dL (ref 32.0–36.0)
MCV: 92.2 fL (ref 80.0–100.0)
Monocytes Absolute: 0.3 10*3/uL (ref 0.2–1.0)
Monocytes Relative: 9 %
NEUTROS PCT: 49 %
Neutro Abs: 1.7 10*3/uL (ref 1.4–6.5)
Platelets: 294 10*3/uL (ref 150–440)
RBC: 3.28 MIL/uL — AB (ref 4.40–5.90)
RDW: 18.2 % — ABNORMAL HIGH (ref 11.5–14.5)
WBC: 3.5 10*3/uL — AB (ref 3.8–10.6)

## 2017-07-10 MED ORDER — PALONOSETRON HCL INJECTION 0.25 MG/5ML
0.2500 mg | Freq: Once | INTRAVENOUS | Status: AC
  Administered 2017-07-10: 0.25 mg via INTRAVENOUS
  Filled 2017-07-10: qty 5

## 2017-07-10 MED ORDER — SODIUM CHLORIDE 0.9 % IV SOLN
Freq: Once | INTRAVENOUS | Status: AC
  Administered 2017-07-10: 09:00:00 via INTRAVENOUS
  Filled 2017-07-10: qty 1000

## 2017-07-10 MED ORDER — SODIUM CHLORIDE 0.9% FLUSH
10.0000 mL | INTRAVENOUS | Status: DC | PRN
Start: 2017-07-10 — End: 2017-07-10
  Filled 2017-07-10: qty 10

## 2017-07-10 MED ORDER — POTASSIUM CHLORIDE 2 MEQ/ML IV SOLN
Freq: Once | INTRAVENOUS | Status: AC
  Administered 2017-07-10: 10:00:00 via INTRAVENOUS
  Filled 2017-07-10: qty 1000

## 2017-07-10 MED ORDER — SODIUM CHLORIDE 0.9% FLUSH
10.0000 mL | INTRAVENOUS | Status: DC | PRN
  Administered 2017-07-10: 10 mL via INTRAVENOUS
  Filled 2017-07-10: qty 10

## 2017-07-10 MED ORDER — HEPARIN SOD (PORK) LOCK FLUSH 100 UNIT/ML IV SOLN
500.0000 [IU] | Freq: Once | INTRAVENOUS | Status: AC
  Administered 2017-07-10: 500 [IU] via INTRAVENOUS
  Filled 2017-07-10: qty 5

## 2017-07-10 MED ORDER — FOSAPREPITANT DIMEGLUMINE INJECTION 150 MG
Freq: Once | INTRAVENOUS | Status: AC
  Administered 2017-07-10: 12:00:00 via INTRAVENOUS
  Filled 2017-07-10: qty 5

## 2017-07-10 MED ORDER — SODIUM CHLORIDE 0.9 % IV SOLN
35.0000 mg/m2 | Freq: Once | INTRAVENOUS | Status: AC
  Administered 2017-07-10: 78 mg via INTRAVENOUS
  Filled 2017-07-10: qty 78

## 2017-07-10 MED ORDER — SODIUM CHLORIDE 0.9 % IV SOLN
1800.0000 mg | Freq: Once | INTRAVENOUS | Status: AC
  Administered 2017-07-10: 1800 mg via INTRAVENOUS
  Filled 2017-07-10: qty 26.3

## 2017-07-10 MED ORDER — HEPARIN SOD (PORK) LOCK FLUSH 100 UNIT/ML IV SOLN
500.0000 [IU] | Freq: Once | INTRAVENOUS | Status: DC | PRN
  Filled 2017-07-10: qty 5

## 2017-07-17 ENCOUNTER — Ambulatory Visit
Admission: RE | Admit: 2017-07-17 | Discharge: 2017-07-17 | Disposition: A | Discharge status: Home / Self Care | Payer: Medicare Other | Source: Ambulatory Visit | Attending: Oncology | Admitting: Oncology

## 2017-07-17 DIAGNOSIS — C674 Malignant neoplasm of posterior wall of bladder: Secondary | ICD-10-CM | POA: Insufficient documentation

## 2017-07-17 DIAGNOSIS — M899 Disorder of bone, unspecified: Secondary | ICD-10-CM | POA: Diagnosis not present

## 2017-07-17 DIAGNOSIS — K7689 Other specified diseases of liver: Secondary | ICD-10-CM | POA: Diagnosis not present

## 2017-07-17 DIAGNOSIS — N281 Cyst of kidney, acquired: Secondary | ICD-10-CM | POA: Diagnosis not present

## 2017-07-17 DIAGNOSIS — I700 Atherosclerosis of aorta: Secondary | ICD-10-CM | POA: Diagnosis not present

## 2017-07-17 DIAGNOSIS — Z5111 Encounter for antineoplastic chemotherapy: Secondary | ICD-10-CM | POA: Diagnosis present

## 2017-07-17 MED ORDER — IOPAMIDOL (ISOVUE-300) INJECTION 61%
100.0000 mL | Freq: Once | INTRAVENOUS | Status: AC | PRN
  Administered 2017-07-17: 100 mL via INTRAVENOUS

## 2017-07-23 NOTE — Progress Notes (Signed)
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 Patient is a 71 year old male with a past medical history significant for atrial fibrillation for which he was on Eliquis in the past. In August 2017 patient had minor trauma to his abdomen while bone pain and then left hematuria or immediately following that which lasted for about a couple of days. Patient thought that this was likely secondary to trauma . However continued to have intermittent hematuria since then and then had a second major episode of bleeding in March 2018. He spoke to his primary care doctor who suggested to stop Eliquis at this time. However bleeding continued for 2-3 days after stopping Eliquis and hence he was referred to urology [name]  2. Ct abdomen on 03/09/17 showed: IMPRESSION: 1. 5.6 cm polypoid mass arises from the posterior right bladder wall, consistent with urothelial neoplasm. 2. 2 cm exophytic hypoattenuating lesion identified along the uncinate process of the pancreas. Dedicated abdominal MRI without and with contrast may prove helpful to further evaluate.  3. MRI abdomen on 03/30/17 showed: . 1.8 cm simple appearing cystic lesion exophytic from the uncinate process of the pancreas is assisted with other scattered tiny pancreatic parenchymal cyst. Repeat MRI in 12 months is recommended. This recommendation follows ACR consensus guidelines: Management of Incidental Pancreatic Cysts: A White Paper of the ACR Incidental Findings Committee. J Am Coll Radiol 4431;54:008-676. 2. 1  cm enhancing focus identified in the anterior aspect of the T12 vertebral body. Imaging features are not suggestive of cavernous hemangioma. Given the history of bladder cancer, metastatic involvement cannot be excluded. Bone scan may prove helpful to further evaluate.  4. Bone scan on 04/10/17 showed: IMPRESSION: 1. No abnormality is seen in the region of T12 to suggest a bone metastasis when compared to the prior MRI images. 2. Negative total body bone scan other than probable degenerative change of the facet joints of the lower lumbar spine.  5. CXr was negative for metastatic disease. Patient underwent TURBT on 04/02/17 which showed: DIAGNOSIS:  A. BLADDER TUMOR; TURBT:  - PAPILLARY UROTHELIAL CARCINOMA, INVADING MUSCULARIS PROPRIA,  HIGH-GRADE (WHO/ISUP).  6. Case was discussed at Tumor board and patient was thought to have atleast T2 cN0 muscle invasive bladder cancer. He has started neoadjuvant gem/cis chemotherapy. Since his counts did not hold up for D15 gemzar, plan is to give 2 weeks on 1 week of gem and split dose cisplatin. He has received 4 cycles of gem/cis 2 weeks on and 1 week off so far wich he has tolerated very well without significant side effects  7. Scans after 4 cycles of hemotherapy showed: 1. Large intraluminal bladder lesions seen on the prior CT scan is no longer evident. No findings to suggest metastatic disease in the abdomen or pelvis on today's exam. 2. Stable hepatic cysts and other smaller liver lesions, too small to characterize, but likely cysts. 3. No change 1.8 cm cystic lesion in the uncinate process of the pancreas. Continued attention on follow-up imaging recommended. 4. Stable 4 cm simple cyst upper pole left kidney. 5. Stable appearance of the focal sclerotic lesion T12  vertebral body. 6.  Aortic Atherosclerois (ICD10-170.0)    Interval history- fatigue has been lasting a little longer. Feels like his food stays in the stomach for long  and that makes him feel queasy. Denies other complaints  ECOG PS- 0 Pain scale- 0   Review of systems- Review of Systems  Constitutional: Negative for chills, fever, malaise/fatigue and weight loss.  HENT: Negative for congestion, ear discharge and nosebleeds.   Eyes: Negative for blurred vision.  Respiratory: Negative for cough, hemoptysis, sputum production, shortness of breath and wheezing.   Cardiovascular: Negative for chest pain, palpitations, orthopnea and claudication.  Gastrointestinal: Negative for abdominal pain, blood in stool, constipation, diarrhea, heartburn, melena, nausea and vomiting.  Genitourinary: Negative for dysuria, flank pain, frequency, hematuria and urgency.  Musculoskeletal: Negative for back pain, joint pain and myalgias.  Skin: Negative for rash.  Neurological: Negative for dizziness, tingling, focal weakness, seizures, weakness and headaches.  Endo/Heme/Allergies: Does not bruise/bleed easily.  Psychiatric/Behavioral: Negative for depression and suicidal ideas. The patient does not have insomnia.       No Known Allergies   Past Medical History:  Diagnosis Date  . Cancer [facility])    Bladder  . Dysrhythmia    Atrial fib  . GERD (gastroesophageal reflux disease)   . Hyperlipidemia   . Hypertension   . Paroxysmal atrial fibrillation (HCC)   . Pre-diabetes    diet controlled  . Sexual dysfunction      Past Surgical History:  Procedure Laterality Date  . APPENDECTOMY    . COLONOSCOPY WITH PROPOFOL N/A 10/25/2015   Procedure: COLONOSCOPY WITH PROPOFOL;  Surgeon: Manya Silvas, MD;  Location: [facility] ENDOSCOPY;  Service: Endoscopy;  Laterality: N/A;  . IR FLUORO GUIDE PORT INSERTION RIGHT  04/27/2017  . TRANSURETHRAL RESECTION OF BLADDER TUMOR N/A 04/02/2017   Procedure: TRANSURETHRAL RESECTION OF BLADDER TUMOR (TURBT);  Surgeon: Hollice Espy, MD;  Location: [facility] ORS;  Service: Urology;  Laterality: N/A;    Social History   Social History  .  Marital status: Married    Spouse name: N/A  . Number of children: N/A  . Years of education: N/A   Occupational History  . Not on file.   Social History Main Topics  . Smoking status: Former Smoker    Packs/day: 1.00    Types: Cigarettes    Quit date: 10/21/1997  . Smokeless tobacco: Never Used  . Alcohol use Yes     Comment: 1-2 glasses of wine daily  . Drug use: No  . Sexual activity: Not on file   Other Topics Concern  . Not on file   Social History Narrative  . No narrative on file    Family History  Problem Relation Age of Onset  . Prostate cancer Father   . Stroke Father   . Atrial fibrillation Father   . Diabetes type II Father   . Kidney cancer Neg Hx      Current Outpatient Prescriptions:  .  apixaban (ELIQUIS) 5 MG TABS tablet, Take 5 mg by mouth 2 (two) times daily., Disp: , Rfl:  .  dexamethasone (DECADRON) 4 MG tablet, Take 2 tablets by mouth once a day on the day after cisplatin chemotherapy and then take 2 tablets two times a day for 2 days. Take with food. (Patient not taking: Reported on 07/03/2017), Disp: 30 tablet, Rfl: 1 .  famotidine (PEPCID AC) 10 MG chewable tablet, Chew 10 mg by mouth daily as needed for heartburn., Disp: , Rfl:  .  hydrochlorothiazide (HYDRODIURIL) 25 MG tablet, Take 25 mg by mouth daily., Disp: , Rfl:  .  lidocaine-prilocaine (EMLA) cream, Apply to affected area once, Disp: 30 g, Rfl: 3 .  LORazepam (ATIVAN) 0.5 MG tablet, Take 1 tablet (0.5 mg total) by mouth every 6 (six) hours as needed (Nausea or vomiting)., Disp: 30 tablet, Rfl: 0 .  metoprolol succinate (TOPROL-XL) 50 MG 24 hr tablet, Take 50 mg by mouth every evening. Take with or immediately following a meal. , Disp: , Rfl:  .  omeprazole (PRILOSEC) 20 MG capsule, Take 20 mg by mouth daily as needed (heartburn). , Disp: , Rfl:  .  ondansetron (ZOFRAN) 8 MG tablet, Take 1 tablet (8 mg total) by mouth 2 (two) times daily as needed. Start on the third day after cisplatin  chemotherapy. (Patient not taking: Reported on 07/03/2017), Disp: 30 tablet, Rfl: 1 .  oxymetazoline (AFRIN) 0.05 % nasal spray, Place 1 spray into left nostril at bedtime as needed for congestion., Disp: , Rfl:  .  prochlorperazine (COMPAZINE) 10 MG tablet, Take 1 tablet (10 mg total) by mouth every 6 (six) hours as needed (Nausea or vomiting). (Patient not taking: Reported on 07/03/2017), Disp: 30 tablet, Rfl: 1  Physical exam:  Vitals:   07/24/17 1144  BP: 126/81  Pulse: 100  Resp: 18  Temp: 98.2 F (36.8 C)  TempSrc: Tympanic  Weight: 210 lb 12.8 oz (95.6 kg)  Height: 6' (1.829 m)   Physical Exam  Constitutional: He is oriented to person, place, and time and well-developed, well-nourished, and in no distress.  HENT:  Head: Normocephalic and atraumatic.  Eyes: Pupils are equal, round, and reactive to light. EOM are normal.  Neck: Normal range of motion.  Cardiovascular: Normal rate, regular rhythm and normal heart sounds.   Pulmonary/Chest: Effort normal and breath sounds normal.  Abdominal: Soft. Bowel sounds are normal.  Neurological: He is alert and oriented to person, place, and time.  Skin: Skin is warm and dry.     CMP Latest Ref Rng & Units 07/24/2017  Glucose 65 - 99 mg/dL 169(H)  BUN 6 - 20 mg/dL 25(H)  Creatinine 0.61 - 1.24 mg/dL 1.24  Sodium 135 - 145 mmol/L 136  Potassium 3.5 - 5.1 mmol/L 4.1  Chloride 101 - 111 mmol/L 99(L)  CO2 22 - 32 mmol/L 27  Calcium 8.9 - 10.3 mg/dL 9.4  Total Protein 6.5 - 8.1 g/dL 6.9  Total Bilirubin 0.3 - 1.2 mg/dL 0.4  Alkaline Phos 38 - 126 U/L 37(L)  AST 15 - 41 U/L 30  ALT 17 - 63 U/L 34   CBC Latest Ref Rng & Units 07/24/2017  WBC 3.8 - 10.6 K/uL 4.0  Hemoglobin 13.0 - 18.0 g/dL 10.3(L)  Hematocrit 40.0 - 52.0 % 29.4(L)  Platelets 150 - 440 K/uL 281    No images are attached to the encounter.  Ct Abdomen Pelvis W Contrast  Result Date: 07/17/2017 CLINICAL DATA:  Bladder cancer.  History of prior appendectomy. EXAM: CT  ABDOMEN AND PELVIS WITH CONTRAST TECHNIQUE: Multidetector CT imaging of the abdomen and pelvis was performed using the standard protocol following bolus administration of intravenous contrast. CONTRAST:  170mL ISOVUE-300 IOPAMIDOL (ISOVUE-300) INJECTION 61% COMPARISON:  03/09/2012 FINDINGS: Lower chest:  Unremarkable. Hepatobiliary: 2 cm cyst in the lateral segment left liver is stable. Other scattered probable cysts in the left liver unchanged. Tiny low-density lesion inferior right liver (image 20) is stable common also likely a cyst. Gallbladder is decompressed. No intrahepatic or  extrahepatic biliary dilation. Pancreas: 1.8 cm low-density, probable cystic lesion in the uncinate process of the pancreas is stable. No dilatation the main pancreatic duct. Spleen: No splenomegaly. No focal mass lesion. Adrenals/Urinary Tract: No adrenal nodule or mass. Right kidney unremarkable. 4 cm simple cyst upper pole left kidney is unchanged. No hydroureteronephrosis. Bladder unremarkable. Specifically, the large mass lesion seen on the prior study is no longer evident although bladder lumen not completely opacified on today's exam. Stomach/Bowel: Stomach is nondistended. No gastric wall thickening. No evidence of outlet obstruction. Duodenum is normally positioned as is the ligament of Treitz. No small bowel wall thickening. No small bowel dilatation. The terminal ileum is normal. The appendix is not visualized, but there is no edema or inflammation in the region of the cecum. Diverticular changes are noted in the left colon without evidence of diverticulitis. Vascular/Lymphatic: There is abdominal aortic atherosclerosis without aneurysm. There is no gastrohepatic or hepatoduodenal ligament lymphadenopathy. No intraperitoneal or retroperitoneal lymphadenopathy. Reproductive: The prostate gland and seminal vesicles have normal imaging features. Other: No intraperitoneal free fluid. Musculoskeletal: 13 mm subtle sclerotic lesion  is identified in the upper aspect of the T12 vertebral body. This lesion was present on the prior study and was also noted on the previous MRI. Bone scan of 04/10/2017 showed no active bony turnover at this location to suggest metastatic deposit. IMPRESSION: 1. Large intraluminal bladder lesions seen on the prior CT scan is no longer evident. No findings to suggest metastatic disease in the abdomen or pelvis on today's exam. 2. Stable hepatic cysts and other smaller liver lesions, too small to characterize, but likely cysts. 3. No change 1.8 cm cystic lesion in the uncinate process of the pancreas. Continued attention on follow-up imaging recommended. 4. Stable 4 cm simple cyst upper pole left kidney. 5. Stable appearance of the focal sclerotic lesion T12 vertebral body. 6.  Aortic Atherosclerois (ICD10-170.0) Electronically Signed   By: Misty Stanley M.D.   On: 07/17/2017 16:00     Assessment and plan- Patient is a 71 y.o. male muscle invasive bladder cancer Stage II cT2N0cM0 s/p 4 cycles of neoadjuvant chemotherapy with gemcitabine/ cisplatin  Discussed ct abdomen results which shows excellent response to treatment. No residual bladder mass seen at this time. He will be seeing [name] on 07/30/17 to discuss definitive surgery. I will proceed with cycle 5 day 1 chemotherapy on 07/26/17 and day 8 chemotherapy next week. Depending on his discussion with [name] and timing of surgery, he will either proceed with surgery after cycle 5 or complete cycle 6 chemotherapy and then go ahead with surgery  Chemo induced anemia- stable. Continue to monitor  Chemo induced nausea- patient does not desire any changes to his meds at this time. He will try eating small frequent meals and continue prn nausea meds  I will see him back in 3 weeks with cbc, cmp for cycle 6 day 1 of gem/cis chemotherapy.    Visit Diagnosis 1. Malignant neoplasm of posterior wall of urinary bladder (HCC)   2. Encounter for antineoplastic  chemotherapy      Dr. Randa Evens, MD, MPH [facility] Pager- [phone] 07/24/2017 12:13 PM

## 2017-07-24 ENCOUNTER — Inpatient Hospital Stay: Payer: Medicare Other

## 2017-07-24 ENCOUNTER — Inpatient Hospital Stay: Payer: Medicare Other | Attending: Oncology | Admitting: Oncology

## 2017-07-24 ENCOUNTER — Encounter: Payer: Self-pay | Admitting: Oncology

## 2017-07-24 VITALS — BP 126/81 | HR 100 | Temp 98.2°F | Resp 18 | Ht 72.0 in | Wt 210.8 lb

## 2017-07-24 DIAGNOSIS — R7303 Prediabetes: Secondary | ICD-10-CM | POA: Diagnosis not present

## 2017-07-24 DIAGNOSIS — R5383 Other fatigue: Secondary | ICD-10-CM | POA: Insufficient documentation

## 2017-07-24 DIAGNOSIS — K7689 Other specified diseases of liver: Secondary | ICD-10-CM | POA: Insufficient documentation

## 2017-07-24 DIAGNOSIS — N281 Cyst of kidney, acquired: Secondary | ICD-10-CM | POA: Diagnosis not present

## 2017-07-24 DIAGNOSIS — Z9221 Personal history of antineoplastic chemotherapy: Secondary | ICD-10-CM | POA: Diagnosis not present

## 2017-07-24 DIAGNOSIS — Z87891 Personal history of nicotine dependence: Secondary | ICD-10-CM | POA: Insufficient documentation

## 2017-07-24 DIAGNOSIS — D6481 Anemia due to antineoplastic chemotherapy: Secondary | ICD-10-CM | POA: Diagnosis not present

## 2017-07-24 DIAGNOSIS — E785 Hyperlipidemia, unspecified: Secondary | ICD-10-CM | POA: Insufficient documentation

## 2017-07-24 DIAGNOSIS — Z7901 Long term (current) use of anticoagulants: Secondary | ICD-10-CM | POA: Diagnosis not present

## 2017-07-24 DIAGNOSIS — K219 Gastro-esophageal reflux disease without esophagitis: Secondary | ICD-10-CM | POA: Insufficient documentation

## 2017-07-24 DIAGNOSIS — T451X5S Adverse effect of antineoplastic and immunosuppressive drugs, sequela: Secondary | ICD-10-CM | POA: Insufficient documentation

## 2017-07-24 DIAGNOSIS — I10 Essential (primary) hypertension: Secondary | ICD-10-CM | POA: Diagnosis not present

## 2017-07-24 DIAGNOSIS — C674 Malignant neoplasm of posterior wall of bladder: Secondary | ICD-10-CM | POA: Diagnosis not present

## 2017-07-24 DIAGNOSIS — I480 Paroxysmal atrial fibrillation: Secondary | ICD-10-CM | POA: Diagnosis not present

## 2017-07-24 DIAGNOSIS — Z5111 Encounter for antineoplastic chemotherapy: Secondary | ICD-10-CM | POA: Insufficient documentation

## 2017-07-24 DIAGNOSIS — Z79899 Other long term (current) drug therapy: Secondary | ICD-10-CM | POA: Diagnosis not present

## 2017-07-24 DIAGNOSIS — R110 Nausea: Secondary | ICD-10-CM | POA: Diagnosis not present

## 2017-07-24 DIAGNOSIS — Z8042 Family history of malignant neoplasm of prostate: Secondary | ICD-10-CM | POA: Diagnosis not present

## 2017-07-24 DIAGNOSIS — K862 Cyst of pancreas: Secondary | ICD-10-CM | POA: Insufficient documentation

## 2017-07-24 LAB — CBC WITH DIFFERENTIAL/PLATELET
BASOS ABS: 0 10*3/uL (ref 0–0.1)
Basophils Relative: 1 %
EOS ABS: 0 10*3/uL (ref 0–0.7)
EOS PCT: 1 %
HCT: 29.4 % — ABNORMAL LOW (ref 40.0–52.0)
HEMOGLOBIN: 10.3 g/dL — AB (ref 13.0–18.0)
LYMPHS ABS: 1.1 10*3/uL (ref 1.0–3.6)
Lymphocytes Relative: 27 %
MCH: 33 pg (ref 26.0–34.0)
MCHC: 34.9 g/dL (ref 32.0–36.0)
MCV: 94.6 fL (ref 80.0–100.0)
Monocytes Absolute: 0.9 10*3/uL (ref 0.2–1.0)
Monocytes Relative: 22 %
NEUTROS PCT: 49 %
Neutro Abs: 1.9 10*3/uL (ref 1.4–6.5)
PLATELETS: 281 10*3/uL (ref 150–440)
RBC: 3.11 MIL/uL — AB (ref 4.40–5.90)
RDW: 20.4 % — ABNORMAL HIGH (ref 11.5–14.5)
WBC: 4 10*3/uL (ref 3.8–10.6)

## 2017-07-24 LAB — COMPREHENSIVE METABOLIC PANEL
ALBUMIN: 4 g/dL (ref 3.5–5.0)
ALK PHOS: 37 U/L — AB (ref 38–126)
ALT: 34 U/L (ref 17–63)
AST: 30 U/L (ref 15–41)
Anion gap: 10 (ref 5–15)
BUN: 25 mg/dL — AB (ref 6–20)
CALCIUM: 9.4 mg/dL (ref 8.9–10.3)
CHLORIDE: 99 mmol/L — AB (ref 101–111)
CO2: 27 mmol/L (ref 22–32)
CREATININE: 1.24 mg/dL (ref 0.61–1.24)
GFR calc non Af Amer: 58 mL/min — ABNORMAL LOW (ref >60)
GLUCOSE: 169 mg/dL — AB (ref 65–99)
Potassium: 4.1 mmol/L (ref 3.5–5.1)
SODIUM: 136 mmol/L (ref 135–145)
Total Bilirubin: 0.4 mg/dL (ref 0.3–1.2)
Total Protein: 6.9 g/dL (ref 6.5–8.1)

## 2017-07-24 NOTE — Progress Notes (Signed)
Pt states it takes longer for him to recuperate from treatments. Gets to feeling better right before it is time to get next treatment.  Also  He feels like his food is sitting on top of stomach and feels like if he burped it would feel better. No nausea though.

## 2017-07-26 ENCOUNTER — Inpatient Hospital Stay: Payer: Medicare Other

## 2017-07-26 VITALS — BP 126/74 | HR 89 | Temp 97.6°F | Resp 18

## 2017-07-26 DIAGNOSIS — Z5111 Encounter for antineoplastic chemotherapy: Secondary | ICD-10-CM | POA: Diagnosis not present

## 2017-07-26 DIAGNOSIS — C674 Malignant neoplasm of posterior wall of bladder: Secondary | ICD-10-CM

## 2017-07-26 MED ORDER — PALONOSETRON HCL INJECTION 0.25 MG/5ML
0.2500 mg | Freq: Once | INTRAVENOUS | Status: AC
  Administered 2017-07-26: 0.25 mg via INTRAVENOUS
  Filled 2017-07-26: qty 5

## 2017-07-26 MED ORDER — HEPARIN SOD (PORK) LOCK FLUSH 100 UNIT/ML IV SOLN
500.0000 [IU] | Freq: Once | INTRAVENOUS | Status: AC | PRN
  Administered 2017-07-26: 500 [IU]
  Filled 2017-07-26: qty 5

## 2017-07-26 MED ORDER — SODIUM CHLORIDE 0.9% FLUSH
10.0000 mL | INTRAVENOUS | Status: DC | PRN
  Administered 2017-07-26: 10 mL
  Filled 2017-07-26: qty 10

## 2017-07-26 MED ORDER — SODIUM CHLORIDE 0.9 % IV SOLN
35.0000 mg/m2 | Freq: Once | INTRAVENOUS | Status: AC
  Administered 2017-07-26: 78 mg via INTRAVENOUS
  Filled 2017-07-26: qty 78

## 2017-07-26 MED ORDER — POTASSIUM CHLORIDE 2 MEQ/ML IV SOLN
Freq: Once | INTRAVENOUS | Status: AC
  Administered 2017-07-26: 09:00:00 via INTRAVENOUS
  Filled 2017-07-26: qty 1000

## 2017-07-26 MED ORDER — SODIUM CHLORIDE 0.9 % IV SOLN
Freq: Once | INTRAVENOUS | Status: AC
  Administered 2017-07-26: 09:00:00 via INTRAVENOUS
  Filled 2017-07-26: qty 1000

## 2017-07-26 MED ORDER — POTASSIUM CHLORIDE 2 MEQ/ML IV SOLN
Freq: Once | INTRAVENOUS | Status: DC
  Filled 2017-07-26: qty 10

## 2017-07-26 MED ORDER — SODIUM CHLORIDE 0.9 % IV SOLN
Freq: Once | INTRAVENOUS | Status: AC
  Administered 2017-07-26: 11:00:00 via INTRAVENOUS
  Filled 2017-07-26: qty 5

## 2017-07-26 MED ORDER — SODIUM CHLORIDE 0.9 % IV SOLN
1800.0000 mg | Freq: Once | INTRAVENOUS | Status: AC
  Administered 2017-07-26: 1800 mg via INTRAVENOUS
  Filled 2017-07-26: qty 26.3

## 2017-07-30 ENCOUNTER — Other Ambulatory Visit: Payer: Self-pay | Admitting: Urology

## 2017-08-02 ENCOUNTER — Inpatient Hospital Stay: Payer: Medicare Other

## 2017-08-02 VITALS — BP 128/83 | HR 80 | Temp 96.1°F | Resp 18

## 2017-08-02 DIAGNOSIS — Z5111 Encounter for antineoplastic chemotherapy: Secondary | ICD-10-CM | POA: Diagnosis not present

## 2017-08-02 DIAGNOSIS — C674 Malignant neoplasm of posterior wall of bladder: Secondary | ICD-10-CM

## 2017-08-02 LAB — CBC WITH DIFFERENTIAL/PLATELET
BASOS ABS: 0 10*3/uL (ref 0–0.1)
BASOS PCT: 1 %
EOS ABS: 0 10*3/uL (ref 0–0.7)
EOS PCT: 0 %
HCT: 26.8 % — ABNORMAL LOW (ref 40.0–52.0)
HEMOGLOBIN: 9.6 g/dL — AB (ref 13.0–18.0)
LYMPHS ABS: 0.9 10*3/uL — AB (ref 1.0–3.6)
Lymphocytes Relative: 32 %
MCH: 33.6 pg (ref 26.0–34.0)
MCHC: 35.6 g/dL (ref 32.0–36.0)
MCV: 94.2 fL (ref 80.0–100.0)
Monocytes Absolute: 0.3 10*3/uL (ref 0.2–1.0)
Monocytes Relative: 11 %
NEUTROS PCT: 56 %
Neutro Abs: 1.7 10*3/uL (ref 1.4–6.5)
PLATELETS: 305 10*3/uL (ref 150–440)
RBC: 2.85 MIL/uL — AB (ref 4.40–5.90)
RDW: 18.6 % — ABNORMAL HIGH (ref 11.5–14.5)
WBC: 3 10*3/uL — AB (ref 3.8–10.6)

## 2017-08-02 LAB — COMPREHENSIVE METABOLIC PANEL
ALBUMIN: 3.9 g/dL (ref 3.5–5.0)
ALK PHOS: 35 U/L — AB (ref 38–126)
ALT: 59 U/L (ref 17–63)
AST: 42 U/L — AB (ref 15–41)
Anion gap: 5 (ref 5–15)
BUN: 23 mg/dL — ABNORMAL HIGH (ref 6–20)
CHLORIDE: 100 mmol/L — AB (ref 101–111)
CO2: 30 mmol/L (ref 22–32)
Calcium: 9.1 mg/dL (ref 8.9–10.3)
Creatinine, Ser: 1.18 mg/dL (ref 0.61–1.24)
GFR calc Af Amer: >60 mL/min (ref >60)
GFR calc non Af Amer: >60 mL/min (ref >60)
Glucose, Bld: 184 mg/dL — ABNORMAL HIGH (ref 65–99)
Potassium: 4 mmol/L (ref 3.5–5.1)
SODIUM: 135 mmol/L (ref 135–145)
Total Bilirubin: 0.5 mg/dL (ref 0.3–1.2)
Total Protein: 6.9 g/dL (ref 6.5–8.1)

## 2017-08-02 MED ORDER — POTASSIUM CHLORIDE 2 MEQ/ML IV SOLN
Freq: Once | INTRAVENOUS | Status: AC
  Administered 2017-08-02: 10:00:00 via INTRAVENOUS
  Filled 2017-08-02: qty 1000

## 2017-08-02 MED ORDER — SODIUM CHLORIDE 0.9 % IV SOLN
1800.0000 mg | Freq: Once | INTRAVENOUS | Status: AC
  Administered 2017-08-02: 1800 mg via INTRAVENOUS
  Filled 2017-08-02: qty 26.3

## 2017-08-02 MED ORDER — SODIUM CHLORIDE 0.9 % IV SOLN
Freq: Once | INTRAVENOUS | Status: AC
  Administered 2017-08-02: 12:00:00 via INTRAVENOUS
  Filled 2017-08-02: qty 5

## 2017-08-02 MED ORDER — SODIUM CHLORIDE 0.9% FLUSH
10.0000 mL | INTRAVENOUS | Status: DC | PRN
  Administered 2017-08-02: 10 mL via INTRAVENOUS
  Filled 2017-08-02: qty 10

## 2017-08-02 MED ORDER — PALONOSETRON HCL INJECTION 0.25 MG/5ML
0.2500 mg | Freq: Once | INTRAVENOUS | Status: AC
  Administered 2017-08-02: 0.25 mg via INTRAVENOUS
  Filled 2017-08-02: qty 5

## 2017-08-02 MED ORDER — SODIUM CHLORIDE 0.9 % IV SOLN
35.0000 mg/m2 | Freq: Once | INTRAVENOUS | Status: AC
  Administered 2017-08-02: 78 mg via INTRAVENOUS
  Filled 2017-08-02: qty 78

## 2017-08-02 MED ORDER — SODIUM CHLORIDE 0.9 % IV SOLN
Freq: Once | INTRAVENOUS | Status: AC
  Administered 2017-08-02: 09:00:00 via INTRAVENOUS
  Filled 2017-08-02: qty 1000

## 2017-08-02 MED ORDER — HEPARIN SOD (PORK) LOCK FLUSH 100 UNIT/ML IV SOLN
500.0000 [IU] | Freq: Once | INTRAVENOUS | Status: AC
  Administered 2017-08-02: 500 [IU] via INTRAVENOUS
  Filled 2017-08-02: qty 5

## 2017-08-02 MED ORDER — SODIUM CHLORIDE 0.9 % IV SOLN: 1800.0000 mg | Freq: Once | INTRAVENOUS | Status: DC

## 2017-08-10 ENCOUNTER — Other Ambulatory Visit: Payer: Self-pay | Admitting: *Deleted

## 2017-08-10 DIAGNOSIS — Z7189 Other specified counseling: Secondary | ICD-10-CM

## 2017-08-10 DIAGNOSIS — C674 Malignant neoplasm of posterior wall of bladder: Secondary | ICD-10-CM

## 2017-08-10 MED ORDER — LORAZEPAM 0.5 MG PO TABS: 0.5000 mg | ORAL_TABLET | Freq: Four times a day (QID) | ORAL | 0 refills | Status: DC | PRN

## 2017-08-13 ENCOUNTER — Telehealth: Payer: Self-pay | Admitting: *Deleted

## 2017-08-13 NOTE — Telephone Encounter (Signed)
Spoke to wife about the appt for 8/30.  He will still keep the lab and md appt but I will cancel the chemo portion. Dr. Janese Banks has returned from chemo and we did get a copy of [name] notes that states that 5 cycles is enough and he does not need any further treatment.  His surgery set up for 10/12. Wife understands that they will still come and get labs and see md but no chemo

## 2017-08-16 ENCOUNTER — Telehealth: Payer: Self-pay | Admitting: *Deleted

## 2017-08-16 ENCOUNTER — Encounter: Payer: Self-pay | Admitting: Oncology

## 2017-08-16 ENCOUNTER — Inpatient Hospital Stay: Payer: Medicare Other

## 2017-08-16 ENCOUNTER — Inpatient Hospital Stay (HOSPITAL_BASED_OUTPATIENT_CLINIC_OR_DEPARTMENT_OTHER): Payer: Medicare Other | Admitting: Oncology

## 2017-08-16 ENCOUNTER — Other Ambulatory Visit: Payer: Self-pay

## 2017-08-16 VITALS — BP 100/77 | HR 99 | Temp 97.1°F | Resp 18 | Wt 211.3 lb

## 2017-08-16 DIAGNOSIS — K862 Cyst of pancreas: Secondary | ICD-10-CM

## 2017-08-16 DIAGNOSIS — Z7901 Long term (current) use of anticoagulants: Secondary | ICD-10-CM

## 2017-08-16 DIAGNOSIS — D649 Anemia, unspecified: Secondary | ICD-10-CM

## 2017-08-16 DIAGNOSIS — K219 Gastro-esophageal reflux disease without esophagitis: Secondary | ICD-10-CM

## 2017-08-16 DIAGNOSIS — E785 Hyperlipidemia, unspecified: Secondary | ICD-10-CM

## 2017-08-16 DIAGNOSIS — C674 Malignant neoplasm of posterior wall of bladder: Secondary | ICD-10-CM

## 2017-08-16 DIAGNOSIS — D6481 Anemia due to antineoplastic chemotherapy: Secondary | ICD-10-CM

## 2017-08-16 DIAGNOSIS — T451X5S Adverse effect of antineoplastic and immunosuppressive drugs, sequela: Secondary | ICD-10-CM | POA: Diagnosis not present

## 2017-08-16 DIAGNOSIS — I480 Paroxysmal atrial fibrillation: Secondary | ICD-10-CM | POA: Diagnosis not present

## 2017-08-16 DIAGNOSIS — Z87891 Personal history of nicotine dependence: Secondary | ICD-10-CM

## 2017-08-16 DIAGNOSIS — N281 Cyst of kidney, acquired: Secondary | ICD-10-CM | POA: Diagnosis not present

## 2017-08-16 DIAGNOSIS — K7689 Other specified diseases of liver: Secondary | ICD-10-CM

## 2017-08-16 DIAGNOSIS — I10 Essential (primary) hypertension: Secondary | ICD-10-CM

## 2017-08-16 DIAGNOSIS — T451X5A Adverse effect of antineoplastic and immunosuppressive drugs, initial encounter: Secondary | ICD-10-CM

## 2017-08-16 DIAGNOSIS — R5383 Other fatigue: Secondary | ICD-10-CM

## 2017-08-16 DIAGNOSIS — R7303 Prediabetes: Secondary | ICD-10-CM | POA: Diagnosis not present

## 2017-08-16 DIAGNOSIS — Z8042 Family history of malignant neoplasm of prostate: Secondary | ICD-10-CM

## 2017-08-16 DIAGNOSIS — Z79899 Other long term (current) drug therapy: Secondary | ICD-10-CM

## 2017-08-16 DIAGNOSIS — Z5111 Encounter for antineoplastic chemotherapy: Secondary | ICD-10-CM | POA: Diagnosis not present

## 2017-08-16 LAB — CBC WITH DIFFERENTIAL/PLATELET
BASOS ABS: 0 10*3/uL (ref 0–0.1)
BASOS PCT: 1 %
EOS ABS: 0 10*3/uL (ref 0–0.7)
EOS PCT: 1 %
HCT: 27.9 % — ABNORMAL LOW (ref 40.0–52.0)
Hemoglobin: 9.7 g/dL — ABNORMAL LOW (ref 13.0–18.0)
LYMPHS ABS: 1.1 10*3/uL (ref 1.0–3.6)
Lymphocytes Relative: 28 %
MCH: 33.7 pg (ref 26.0–34.0)
MCHC: 34.7 g/dL (ref 32.0–36.0)
MCV: 97.1 fL (ref 80.0–100.0)
Monocytes Absolute: 0.8 10*3/uL (ref 0.2–1.0)
Monocytes Relative: 20 %
NEUTROS PCT: 50 %
Neutro Abs: 1.9 10*3/uL (ref 1.4–6.5)
PLATELETS: 277 10*3/uL (ref 150–440)
RBC: 2.88 MIL/uL — AB (ref 4.40–5.90)
RDW: 18.8 % — ABNORMAL HIGH (ref 11.5–14.5)
WBC: 3.8 10*3/uL (ref 3.8–10.6)

## 2017-08-16 LAB — COMPREHENSIVE METABOLIC PANEL
ALBUMIN: 4.1 g/dL (ref 3.5–5.0)
ALK PHOS: 39 U/L (ref 38–126)
ALT: 31 U/L (ref 17–63)
AST: 33 U/L (ref 15–41)
Anion gap: 6 (ref 5–15)
BUN: 28 mg/dL — ABNORMAL HIGH (ref 6–20)
CALCIUM: 9.2 mg/dL (ref 8.9–10.3)
CHLORIDE: 100 mmol/L — AB (ref 101–111)
CO2: 31 mmol/L (ref 22–32)
CREATININE: 1.26 mg/dL — AB (ref 0.61–1.24)
GFR calc non Af Amer: 57 mL/min — ABNORMAL LOW (ref >60)
GLUCOSE: 190 mg/dL — AB (ref 65–99)
Potassium: 4.3 mmol/L (ref 3.5–5.1)
SODIUM: 137 mmol/L (ref 135–145)
Total Bilirubin: 0.4 mg/dL (ref 0.3–1.2)
Total Protein: 7 g/dL (ref 6.5–8.1)

## 2017-08-16 LAB — RETICULOCYTES
RBC.: 2.98 MIL/uL — AB (ref 4.40–5.90)
RETIC CT PCT: 4.9 % — AB (ref 0.4–3.1)
Retic Count, Absolute: 146 10*3/uL (ref 19.0–183.0)

## 2017-08-16 LAB — FERRITIN: Ferritin: 404 ng/mL — ABNORMAL HIGH (ref 24–336)

## 2017-08-16 LAB — IRON AND TIBC
Iron: 58 ug/dL (ref 45–182)
SATURATION RATIOS: 17 % — AB (ref 17.9–39.5)
TIBC: 340 ug/dL (ref 250–450)
UIBC: 282 ug/dL

## 2017-08-16 LAB — FOLATE: Folate: 15.9 ng/mL (ref >5.9)

## 2017-08-16 NOTE — Progress Notes (Signed)
Here for follow up

## 2017-08-16 NOTE — Telephone Encounter (Signed)
Called the pt and spoke to wife and told her ferritin is normal and iron is in the low normal side so Dr/ Janese Banks does rec; ferrous sulfate 325 mg over the counter once a day and it can cause constipation and stomach cramping so make sure he takes it with full meal.  She will start it next week because his stomach has been upset a little this week

## 2017-08-16 NOTE — Progress Notes (Signed)
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 Patient is a 71 year old male with a past medical history significant for atrial fibrillation for which he was on Eliquis in the past. In August 2017 patient had minor trauma to his abdomen while bone pain and then left hematuria or immediately following that which lasted for about a couple of days. Patient thought that this was likely secondary to trauma . However continued to have intermittent hematuria since then and then had a second major episode of bleeding in March 2018. He spoke to his primary care doctor who suggested to stop Eliquis at this time. However bleeding continued for 2-3 days after stopping Eliquis and hence he was referred to urology [name]  2. Ct abdomen on 03/09/17 showed: IMPRESSION: 1. 5.6 cm polypoid mass arises from the posterior right bladder wall, consistent with urothelial neoplasm. 2. 2 cm exophytic hypoattenuating lesion identified along the uncinate process of the pancreas. Dedicated abdominal MRI without and with contrast may prove helpful to further evaluate.  3. MRI abdomen on 03/30/17 showed: . 1.8 cm simple appearing cystic lesion exophytic from the uncinate process of the pancreas is assisted with other scattered tiny pancreatic parenchymal cyst. Repeat MRI in 12 months is recommended. This recommendation follows ACR consensus guidelines: Management of Incidental Pancreatic Cysts: A White Paper of the ACR Incidental Findings Committee. J Am Coll Radiol 0272;53:664-403. 2. 1 cm enhancing  focus identified in the anterior aspect of the T12 vertebral body. Imaging features are not suggestive of cavernous hemangioma. Given the history of bladder cancer, metastatic involvement cannot be excluded. Bone scan may prove helpful to further evaluate.  4. Bone scan on 04/10/17 showed: IMPRESSION: 1. No abnormality is seen in the region of T12 to suggest a bone metastasis when compared to the prior MRI images. 2. Negative total body bone scan other than probable degenerative change of the facet joints of the lower lumbar spine.  5. CXr was negative for metastatic disease. Patient underwent TURBT on 04/02/17 which showed: DIAGNOSIS:  A. BLADDER TUMOR; TURBT:  - PAPILLARY UROTHELIAL CARCINOMA, INVADING MUSCULARIS PROPRIA,  HIGH-GRADE (WHO/ISUP).  6. Case was discussed at Tumor board and patient was thought to have atleast T2 cN0 muscle invasive bladder cancer. He has started neoadjuvant gem/cis chemotherapy. Since his counts did not hold up for D15 gemzar, plan is to give 2 weeks on 1 week of gem and split dose cisplatin. He has received 4 cycles of gem/cis 2 weeks on and 1 week off so far wich he has tolerated very well without significant side effects  7. Scans after 4 cycles of hemotherapy showed: 1. Large intraluminal bladder lesions seen on the prior CT scan is no longer evident. No findings to suggest metastatic disease in the abdomen or pelvis on today's exam. 2. Stable hepatic cysts and other smaller liver lesions, too small to characterize, but likely cysts. 3. No change 1.8 cm cystic lesion in the uncinate process of the pancreas. Continued attention on follow-up imaging recommended. 4. Stable 4 cm simple cyst upper pole left kidney. 5. Stable appearance of the focal sclerotic lesion T12 vertebral body.  6. Aortic Atherosclerois (ICD10-170.0)  8. Patient completed 5 cycles of gem/cis chemotherapy and is scheduled to undergo cystectomy on 09/28/17   Interval history-  feels fatigued. Denies other complaints  ECOG PS- 1 Pain scale- 0   Review of systems- Review of Systems  Constitutional: Negative for chills, fever, malaise/fatigue and weight loss.  HENT: Negative for congestion, ear discharge and nosebleeds.   Eyes: Negative for blurred vision.  Respiratory: Negative for cough, hemoptysis, sputum production, shortness of breath and wheezing.   Cardiovascular: Negative for chest pain, palpitations, orthopnea and claudication.  Gastrointestinal: Negative for abdominal pain, blood in stool, constipation, diarrhea, heartburn, melena, nausea and vomiting.  Genitourinary: Negative for dysuria, flank pain, frequency, hematuria and urgency.  Musculoskeletal: Negative for back pain, joint pain and myalgias.  Skin: Negative for rash.  Neurological: Negative for dizziness, tingling, focal weakness, seizures, weakness and headaches.  Endo/Heme/Allergies: Does not bruise/bleed easily.  Psychiatric/Behavioral: Negative for depression and suicidal ideas. The patient does not have insomnia.       No Known Allergies   Past Medical History:  Diagnosis Date  . Cancer [facility])    Bladder  . Dysrhythmia    Atrial fib  . GERD (gastroesophageal reflux disease)   . Hyperlipidemia   . Hypertension   . Paroxysmal atrial fibrillation (HCC)   . Pre-diabetes    diet controlled  . Sexual dysfunction      Past Surgical History:  Procedure Laterality Date  . APPENDECTOMY    . COLONOSCOPY WITH PROPOFOL N/A 10/25/2015   Procedure: COLONOSCOPY WITH PROPOFOL;  Surgeon: Manya Silvas, MD;  Location: [facility] ENDOSCOPY;  Service: Endoscopy;  Laterality: N/A;  . IR FLUORO GUIDE PORT INSERTION RIGHT  04/27/2017  . TRANSURETHRAL RESECTION OF BLADDER TUMOR N/A 04/02/2017   Procedure: TRANSURETHRAL RESECTION OF BLADDER TUMOR (TURBT);  Surgeon: Hollice Espy, MD;  Location: [facility] ORS;  Service: Urology;  Laterality: N/A;    Social History   Social History  . Marital status:  Married    Spouse name: N/A  . Number of children: N/A  . Years of education: N/A   Occupational History  . Not on file.   Social History Main Topics  . Smoking status: Former Smoker    Packs/day: 1.00    Types: Cigarettes    Quit date: 10/21/1997  . Smokeless tobacco: Never Used  . Alcohol use Yes     Comment: 1-2 glasses of wine daily  . Drug use: No  . Sexual activity: Not on file   Other Topics Concern  . Not on file   Social History Narrative  . No narrative on file    Family History  Problem Relation Age of Onset  . Prostate cancer Father   . Stroke Father   . Atrial fibrillation Father   . Diabetes type II Father   . Kidney cancer Neg Hx      Current Outpatient Prescriptions:  .  apixaban (ELIQUIS) 5 MG TABS tablet, Take 5 mg by mouth 2 (two) times daily., Disp: , Rfl:  .  dexamethasone (DECADRON) 4 MG tablet, Take 2 tablets by mouth once a day on the day after cisplatin chemotherapy and then take 2 tablets two times a day for 2 days. Take with food., Disp: 30 tablet, Rfl: 1 .  famotidine (PEPCID AC) 10 MG chewable tablet, Chew 10 mg by mouth daily as needed for heartburn., Disp: , Rfl:  .  hydrochlorothiazide (HYDRODIURIL) 25 MG tablet, Take 25 mg by mouth daily., Disp: ,  Rfl:  .  lidocaine-prilocaine (EMLA) cream, Apply to affected area once, Disp: 30 g, Rfl: 3 .  LORazepam (ATIVAN) 0.5 MG tablet, Take 1 tablet (0.5 mg total) by mouth every 6 (six) hours as needed (Nausea or vomiting)., Disp: 30 tablet, Rfl: 0 .  metoprolol succinate (TOPROL-XL) 50 MG 24 hr tablet, Take 50 mg by mouth every evening. Take with or immediately following a meal. , Disp: , Rfl:  .  omeprazole (PRILOSEC) 20 MG capsule, Take 20 mg by mouth daily as needed (heartburn). , Disp: , Rfl:  .  ondansetron (ZOFRAN) 8 MG tablet, Take 1 tablet (8 mg total) by mouth 2 (two) times daily as needed. Start on the third day after cisplatin chemotherapy., Disp: 30 tablet, Rfl: 1 .  oxymetazoline (AFRIN)  0.05 % nasal spray, Place 1 spray into left nostril at bedtime as needed for congestion., Disp: , Rfl:  .  prochlorperazine (COMPAZINE) 10 MG tablet, Take 1 tablet (10 mg total) by mouth every 6 (six) hours as needed (Nausea or vomiting)., Disp: 30 tablet, Rfl: 1  Physical exam:  Vitals:   08/16/17 0836  BP: 100/77  Pulse: 99  Resp: 18  Temp: (!) 97.1 F (36.2 C)  TempSrc: Tympanic  Weight: 211 lb 4.8 oz (95.8 kg)   Physical Exam  Constitutional: He is oriented to person, place, and time and well-developed, well-nourished, and in no distress.  HENT:  Head: Normocephalic and atraumatic.  Eyes: Pupils are equal, round, and reactive to light. EOM are normal.  Neck: Normal range of motion.  Cardiovascular: Normal rate, regular rhythm and normal heart sounds.   Pulmonary/Chest: Effort normal and breath sounds normal.  Abdominal: Soft. Bowel sounds are normal.  Neurological: He is alert and oriented to person, place, and time.  Skin: Skin is warm and dry.     CMP Latest Ref Rng & Units 08/16/2017  Glucose 65 - 99 mg/dL 190(H)  BUN 6 - 20 mg/dL 28(H)  Creatinine 0.61 - 1.24 mg/dL 1.26(H)  Sodium 135 - 145 mmol/L 137  Potassium 3.5 - 5.1 mmol/L 4.3  Chloride 101 - 111 mmol/L 100(L)  CO2 22 - 32 mmol/L 31  Calcium 8.9 - 10.3 mg/dL 9.2  Total Protein 6.5 - 8.1 g/dL 7.0  Total Bilirubin 0.3 - 1.2 mg/dL 0.4  Alkaline Phos 38 - 126 U/L 39  AST 15 - 41 U/L 33  ALT 17 - 63 U/L 31   CBC Latest Ref Rng & Units 08/16/2017  WBC 3.8 - 10.6 K/uL 3.8  Hemoglobin 13.0 - 18.0 g/dL 9.7(L)  Hematocrit 40.0 - 52.0 % 27.9(L)  Platelets 150 - 440 K/uL 277    No images are attached to the encounter.  Ct Abdomen Pelvis W Contrast  Result Date: 07/17/2017 CLINICAL DATA:  Bladder cancer.  History of prior appendectomy. EXAM: CT ABDOMEN AND PELVIS WITH CONTRAST TECHNIQUE: Multidetector CT imaging of the abdomen and pelvis was performed using the standard protocol following bolus administration of  intravenous contrast. CONTRAST:  175mL ISOVUE-300 IOPAMIDOL (ISOVUE-300) INJECTION 61% COMPARISON:  03/09/2012 FINDINGS: Lower chest:  Unremarkable. Hepatobiliary: 2 cm cyst in the lateral segment left liver is stable. Other scattered probable cysts in the left liver unchanged. Tiny low-density lesion inferior right liver (image 20) is stable common also likely a cyst. Gallbladder is decompressed. No intrahepatic or extrahepatic biliary dilation. Pancreas: 1.8 cm low-density, probable cystic lesion in the uncinate process of the pancreas is stable. No dilatation the main pancreatic duct. Spleen: No splenomegaly. No  focal mass lesion. Adrenals/Urinary Tract: No adrenal nodule or mass. Right kidney unremarkable. 4 cm simple cyst upper pole left kidney is unchanged. No hydroureteronephrosis. Bladder unremarkable. Specifically, the large mass lesion seen on the prior study is no longer evident although bladder lumen not completely opacified on today's exam. Stomach/Bowel: Stomach is nondistended. No gastric wall thickening. No evidence of outlet obstruction. Duodenum is normally positioned as is the ligament of Treitz. No small bowel wall thickening. No small bowel dilatation. The terminal ileum is normal. The appendix is not visualized, but there is no edema or inflammation in the region of the cecum. Diverticular changes are noted in the left colon without evidence of diverticulitis. Vascular/Lymphatic: There is abdominal aortic atherosclerosis without aneurysm. There is no gastrohepatic or hepatoduodenal ligament lymphadenopathy. No intraperitoneal or retroperitoneal lymphadenopathy. Reproductive: The prostate gland and seminal vesicles have normal imaging features. Other: No intraperitoneal free fluid. Musculoskeletal: 13 mm subtle sclerotic lesion is identified in the upper aspect of the T12 vertebral body. This lesion was present on the prior study and was also noted on the previous MRI. Bone scan of 04/10/2017  showed no active bony turnover at this location to suggest metastatic deposit. IMPRESSION: 1. Large intraluminal bladder lesions seen on the prior CT scan is no longer evident. No findings to suggest metastatic disease in the abdomen or pelvis on today's exam. 2. Stable hepatic cysts and other smaller liver lesions, too small to characterize, but likely cysts. 3. No change 1.8 cm cystic lesion in the uncinate process of the pancreas. Continued attention on follow-up imaging recommended. 4. Stable 4 cm simple cyst upper pole left kidney. 5. Stable appearance of the focal sclerotic lesion T12 vertebral body. 6.  Aortic Atherosclerois (ICD10-170.0) Electronically Signed   By: Misty Stanley M.D.   On: 07/17/2017 16:00     Assessment and plan- Patient is a 71 y.o. male muscle invasive bladder cancer Stage II cT2N0cM0 s/p 5 cycles of neoadjuvant chemotherapy with gemcitabine/ cisplatin  Overall he has tolerated chemo well. Interim scans after 4 cycles showed no adenopathy or visible bladder mass. He is scheduled to undergo cystectomy on 09/28/17. I will see him 3 months from now post surgery for routine f/u with labs  Chemo induced anemia- check iron studies, b12 and folate today    Visit Diagnosis 1. Malignant neoplasm of posterior wall of urinary bladder (HCC)   2. Anemia due to antineoplastic chemotherapy   3. Anemia, unspecified type      Dr. Randa Evens, MD, MPH [facility] at [facility] Pager- [phone] 08/16/2017 9:44 AM

## 2017-08-16 NOTE — Telephone Encounter (Signed)
-----   Message from Sindy Guadeloupe, MD sent at 08/16/2017 10:45 AM EDT ----- Iron saturation a bit low. He can take oral iron once a day if he can tolerate. No need for IV iron

## 2017-09-13 ENCOUNTER — Inpatient Hospital Stay: Payer: Medicare Other | Attending: Oncology

## 2017-09-13 DIAGNOSIS — Z452 Encounter for adjustment and management of vascular access device: Secondary | ICD-10-CM | POA: Insufficient documentation

## 2017-09-13 DIAGNOSIS — Z9221 Personal history of antineoplastic chemotherapy: Secondary | ICD-10-CM | POA: Diagnosis not present

## 2017-09-13 DIAGNOSIS — C674 Malignant neoplasm of posterior wall of bladder: Secondary | ICD-10-CM

## 2017-09-13 MED ORDER — SODIUM CHLORIDE 0.9% FLUSH
10.0000 mL | INTRAVENOUS | Status: DC | PRN
  Administered 2017-09-13: 10 mL via INTRAVENOUS
  Filled 2017-09-13: qty 10

## 2017-09-13 MED ORDER — HEPARIN SOD (PORK) LOCK FLUSH 100 UNIT/ML IV SOLN
500.0000 [IU] | Freq: Once | INTRAVENOUS | Status: AC
  Administered 2017-09-13: 500 [IU] via INTRAVENOUS

## 2017-09-27 ENCOUNTER — Other Ambulatory Visit: Payer: Self-pay

## 2017-09-27 ENCOUNTER — Inpatient Hospital Stay (HOSPITAL_COMMUNITY)
Admission: AD | Admit: 2017-09-27 | Discharge: 2017-10-03 | DRG: 654 | Disposition: A | Discharge status: Home Health | Payer: Medicare Other | Source: Ambulatory Visit | Attending: Urology | Admitting: Urology

## 2017-09-27 ENCOUNTER — Encounter (HOSPITAL_COMMUNITY): Payer: Self-pay | Admitting: *Deleted

## 2017-09-27 DIAGNOSIS — Z823 Family history of stroke: Secondary | ICD-10-CM

## 2017-09-27 DIAGNOSIS — N281 Cyst of kidney, acquired: Secondary | ICD-10-CM | POA: Diagnosis not present

## 2017-09-27 DIAGNOSIS — K219 Gastro-esophageal reflux disease without esophagitis: Secondary | ICD-10-CM | POA: Diagnosis present

## 2017-09-27 DIAGNOSIS — Z79899 Other long term (current) drug therapy: Secondary | ICD-10-CM | POA: Diagnosis not present

## 2017-09-27 DIAGNOSIS — C679 Malignant neoplasm of bladder, unspecified: Principal | ICD-10-CM | POA: Diagnosis present

## 2017-09-27 DIAGNOSIS — Z87891 Personal history of nicotine dependence: Secondary | ICD-10-CM | POA: Diagnosis not present

## 2017-09-27 DIAGNOSIS — Z7901 Long term (current) use of anticoagulants: Secondary | ICD-10-CM

## 2017-09-27 DIAGNOSIS — K429 Umbilical hernia without obstruction or gangrene: Secondary | ICD-10-CM | POA: Diagnosis present

## 2017-09-27 DIAGNOSIS — E785 Hyperlipidemia, unspecified: Secondary | ICD-10-CM | POA: Diagnosis not present

## 2017-09-27 DIAGNOSIS — Z833 Family history of diabetes mellitus: Secondary | ICD-10-CM | POA: Diagnosis not present

## 2017-09-27 DIAGNOSIS — K567 Ileus, unspecified: Secondary | ICD-10-CM | POA: Diagnosis not present

## 2017-09-27 DIAGNOSIS — Z9221 Personal history of antineoplastic chemotherapy: Secondary | ICD-10-CM

## 2017-09-27 DIAGNOSIS — I10 Essential (primary) hypertension: Secondary | ICD-10-CM | POA: Diagnosis not present

## 2017-09-27 DIAGNOSIS — Z8042 Family history of malignant neoplasm of prostate: Secondary | ICD-10-CM

## 2017-09-27 DIAGNOSIS — I480 Paroxysmal atrial fibrillation: Secondary | ICD-10-CM | POA: Diagnosis not present

## 2017-09-27 DIAGNOSIS — Z936 Other artificial openings of urinary tract status: Secondary | ICD-10-CM

## 2017-09-27 LAB — COMPREHENSIVE METABOLIC PANEL
ALT: 51 U/L (ref 17–63)
AST: 39 U/L (ref 15–41)
Albumin: 4.1 g/dL (ref 3.5–5.0)
Alkaline Phosphatase: 31 U/L — ABNORMAL LOW (ref 38–126)
Anion gap: 9 (ref 5–15)
BUN: 28 mg/dL — AB (ref 6–20)
CHLORIDE: 102 mmol/L (ref 101–111)
CO2: 27 mmol/L (ref 22–32)
Calcium: 8.8 mg/dL — ABNORMAL LOW (ref 8.9–10.3)
Creatinine, Ser: 1.23 mg/dL (ref 0.61–1.24)
GFR, EST NON AFRICAN AMERICAN: 59 mL/min — AB (ref >60)
Glucose, Bld: 153 mg/dL — ABNORMAL HIGH (ref 65–99)
POTASSIUM: 3.6 mmol/L (ref 3.5–5.1)
SODIUM: 138 mmol/L (ref 135–145)
Total Bilirubin: 0.5 mg/dL (ref 0.3–1.2)
Total Protein: 6.9 g/dL (ref 6.5–8.1)

## 2017-09-27 LAB — CBC
HCT: 35.5 % — ABNORMAL LOW (ref 39.0–52.0)
Hemoglobin: 11.6 g/dL — ABNORMAL LOW (ref 13.0–17.0)
MCH: 31.4 pg (ref 26.0–34.0)
MCHC: 32.7 g/dL (ref 30.0–36.0)
MCV: 95.9 fL (ref 78.0–100.0)
PLATELETS: 188 10*3/uL (ref 150–400)
RBC: 3.7 MIL/uL — AB (ref 4.22–5.81)
RDW: 13.6 % (ref 11.5–15.5)
WBC: 5.1 10*3/uL (ref 4.0–10.5)

## 2017-09-27 MED ORDER — PANTOPRAZOLE SODIUM 40 MG PO TBEC
40.0000 mg | DELAYED_RELEASE_TABLET | Freq: Every day | ORAL | Status: DC
  Administered 2017-09-29 – 2017-10-03 (×5): 40 mg via ORAL
  Filled 2017-09-27 (×5): qty 1

## 2017-09-27 MED ORDER — PIPERACILLIN-TAZOBACTAM 3.375 G IVPB 30 MIN
3.3750 g | INTRAVENOUS | Status: AC
  Administered 2017-09-28: 3.375 g via INTRAVENOUS
  Filled 2017-09-27 (×2): qty 50

## 2017-09-27 MED ORDER — SODIUM CHLORIDE 0.9 % IV SOLN
INTRAVENOUS | Status: DC
  Administered 2017-09-27: 13:00:00 via INTRAVENOUS

## 2017-09-27 MED ORDER — METOPROLOL SUCCINATE ER 50 MG PO TB24
50.0000 mg | ORAL_TABLET | Freq: Every evening | ORAL | Status: DC
  Administered 2017-09-27 – 2017-10-02 (×6): 50 mg via ORAL
  Filled 2017-09-27: qty 1
  Filled 2017-09-27: qty 2
  Filled 2017-09-27 (×5): qty 1

## 2017-09-27 MED ORDER — PNEUMOCOCCAL VAC POLYVALENT 25 MCG/0.5ML IJ INJ
0.5000 mL | INJECTION | INTRAMUSCULAR | Status: DC
  Filled 2017-09-27: qty 0.5

## 2017-09-27 MED ORDER — HYDROCHLOROTHIAZIDE 25 MG PO TABS
25.0000 mg | ORAL_TABLET | Freq: Every day | ORAL | Status: DC
  Administered 2017-09-29 – 2017-10-03 (×5): 25 mg via ORAL
  Filled 2017-09-27 (×5): qty 1

## 2017-09-27 MED ORDER — LORAZEPAM 0.5 MG PO TABS: 0.5000 mg | ORAL_TABLET | Freq: Four times a day (QID) | ORAL | Status: DC | PRN

## 2017-09-27 MED ORDER — PEG 3350-KCL-NA BICARB-NACL 420 G PO SOLR
4000.0000 mL | Freq: Once | ORAL | Status: AC
  Administered 2017-09-27: 4000 mL via ORAL

## 2017-09-27 NOTE — Progress Notes (Signed)
Patient has arrived to unit, alert oriented ambulatory without assist. Will page provider for orders

## 2017-09-27 NOTE — Consult Note (Addendum)
[place] Nurse requested for preoperative stoma site marking  Discussed surgical procedure and stoma creation with patient and family.  Explained role of the [place] nurse team.  Provided the patient with educational booklet and provided samples of pouching options.  Answered patient and family questions.   Examined patient sitting, and standing in order to place the marking in the patient's visual field, away from any creases or abdominal contour issues and within the rectus muscle.  Attempted to mark below the patient's belt line, but this was not possible related to a significant fold which occurs when seated.  Marked for ileal conduit in the RLQ __6__  cm to the right of the umbilicus and  __4__ cm above the umbilicus.  Patient's abdomen cleansed with CHG wipes at site markings, allowed to air dry prior to marking. Pt plans to have surgery tomorrow. [place] Nurse team will follow up with patient after surgery for continue ostomy care and teaching.   Julien Girt MSN, RN, [place], [place], [place]

## 2017-09-27 NOTE — H&P (Signed)
Randy Wheeler is an 71 y.o. male.    Chief Complaint: Pre-op Cystoprostatectomy  HPI:   1 - Muscle-Invasive Bladder Cancer - AT least T2G3 bladder cancer by CT/ TURBT 03/2017 on eval gross hematuria by [name]. CT with some parasitic vessels and stranding Rt bladder neck, but no adenopathy or distant disease. T12 bone lesion negative by MRI. CMP normal. Underwent 5 cycles neo-adjuvant gem-Cis under care of Dr. Bryna Colander with [facility]. Restaging after neo-adjuvant chemo remains clinically localized.   2 - Non-complex LEFT Renal Cyst - 4cm left mid lateral non-complex cyst on imaging x several. No mass effect, coarse calcifications, enhancing nodules.   PMH sig for AFib (no clots / CVA), HLD, HTN, Appy. NO ischemiec CV disesae. His PCP is Dion Body MD with [facility] in [place].   Today " Randy Wheeler " is seen for pre-op admission prior to cystoprostatectomy tomorrow. NO interval fevers.    Past Medical History:  Diagnosis Date  . Cancer [facility])    Bladder  . Dysrhythmia    Atrial fib  . GERD (gastroesophageal reflux disease)   . Hyperlipidemia   . Hypertension   . Paroxysmal atrial fibrillation (HCC)   . Pre-diabetes    diet controlled  . Sexual dysfunction     Past Surgical History:  Procedure Laterality Date  . APPENDECTOMY    . COLONOSCOPY WITH PROPOFOL N/A 10/25/2015   Procedure: COLONOSCOPY WITH PROPOFOL;  Surgeon: Manya Silvas, MD;  Location[facility] ENDOSCOPY;  Service: Endoscopy;  Laterality: N/A;  . IR FLUORO GUIDE PORT INSERTION RIGHT  04/27/2017  . TRANSURETHRAL RESECTION OF BLADDER TUMOR N/A 04/02/2017   Procedure: TRANSURETHRAL RESECTION OF BLADDER TUMOR (TURBT);  Surgeon: Hollice Espy, MD;  Location: [facility] ORS;  Service: Urology;  Laterality: N/A;    Family History  Problem Relation Age of Onset  . Prostate cancer Father   . Stroke Father   . Atrial fibrillation Father   . Diabetes type II Father   . Kidney cancer Neg Hx    Social  History:  reports that he quit smoking about 19 years ago. His smoking use included Cigarettes. He smoked 1.00 pack per day. He has never used smokeless tobacco. He reports that he drinks alcohol. He reports that he does not use drugs.  Allergies: No Known Allergies  Medications Prior to Admission  Medication Sig Dispense Refill  . famotidine (PEPCID AC) 10 MG chewable tablet Chew 10 mg by mouth daily as needed for heartburn.    . fluticasone (FLONASE) 50 MCG/ACT nasal spray Place 1 spray into both nostrils at bedtime as needed for allergies or rhinitis.    . hydrochlorothiazide (HYDRODIURIL) 25 MG tablet Take 25 mg by mouth daily.    Marland Kitchen lidocaine-prilocaine (EMLA) cream Apply to affected area once 30 g 3  . LORazepam (ATIVAN) 0.5 MG tablet Take 1 tablet (0.5 mg total) by mouth every 6 (six) hours as needed (Nausea or vomiting). 30 tablet 0  . metoprolol succinate (TOPROL-XL) 50 MG 24 hr tablet Take 50 mg by mouth every evening. Take with or immediately following a meal.     . omeprazole (PRILOSEC) 20 MG capsule Take 20 mg by mouth every morning.     . ondansetron (ZOFRAN) 8 MG tablet Take 1 tablet (8 mg total) by mouth 2 (two) times daily as needed. Start on the third day after cisplatin chemotherapy. 30 tablet 1  . prochlorperazine (COMPAZINE) 10 MG tablet Take 1 tablet (10 mg total) by mouth every  6 (six) hours as needed (Nausea or vomiting). 30 tablet 1  . apixaban (ELIQUIS) 5 MG TABS tablet Take 5 mg by mouth 2 (two) times daily.      No results found for this or any previous visit (from the past 48 hour(s)). No results found.  Review of Systems  Constitutional: Negative.   HENT: Negative.   Eyes: Negative.   Respiratory: Negative.   Cardiovascular: Negative.   Gastrointestinal: Negative.   Genitourinary: Negative.   Musculoskeletal: Negative.   Skin: Negative.   Neurological: Negative.   Endo/Heme/Allergies: Negative.   Psychiatric/Behavioral: Negative.   All other systems  reviewed and are negative.   There were no vitals taken for this visit. Physical Exam  Constitutional: He appears well-developed.  HENT:  Head: Normocephalic.  Eyes: Pupils are equal, round, and reactive to light.  Neck: Normal range of motion.  Cardiovascular: Normal rate.   Respiratory: Effort normal.  GI: Soft.  Genitourinary:  Genitourinary Comments: No CVAT  Musculoskeletal: Normal range of motion.  Neurological: He is alert.  Skin: Skin is warm.  Psychiatric: He has a normal mood and affect.     Assessment/Plan  Proceed as planned with cystoprostatectomy / ICG injection / pelvic lymphadenectomy. Risks, benefits, alternatives, expected peri-op course discussed previously and reiterated today.   CMP, CBC, Stomal RN consult, ABX + mechanical bowel prep, Entereg.  Alexis Frock, MD 09/27/2017, 12:23 PM

## 2017-09-27 NOTE — Anesthesia Preprocedure Evaluation (Addendum)
Anesthesia Evaluation  Patient identified by MRN, date of birth, ID band Patient awake    Reviewed: Allergy & Precautions, H&P , NPO status , Patient's Chart, lab work & pertinent test results  Airway Mallampati: II  TM Distance: >3 FB Neck ROM: Full    Dental no notable dental hx. (+) Teeth Intact, Dental Advisory Given   Pulmonary neg pulmonary ROS, former smoker,    Pulmonary exam normal breath sounds clear to auscultation       Cardiovascular Exercise Tolerance: Good hypertension, Pt. on medications and Pt. on home beta blockers + dysrhythmias Atrial Fibrillation  Rhythm:Regular Rate:Normal     Neuro/Psych negative neurological ROS  negative psych ROS   GI/Hepatic Neg liver ROS, GERD  Medicated and Controlled,  Endo/Other  negative endocrine ROS  Renal/GU negative Renal ROS   Bladder CA negative genitourinary   Musculoskeletal   Abdominal   Peds  Hematology negative hematology ROS (+)   Anesthesia Other Findings   Reproductive/Obstetrics negative OB ROS                            Anesthesia Physical Anesthesia Plan  ASA: III  Anesthesia Plan: General   Post-op Pain Management:    Induction: Intravenous  PONV Risk Score and Plan: 3 and Ondansetron, Dexamethasone and Midazolam  Airway Management Planned: Oral ETT  Additional Equipment: Arterial line  Intra-op Plan:   Post-operative Plan: Extubation in OR  Informed Consent: I have reviewed the patients History and Physical, chart, labs and discussed the procedure including the risks, benefits and alternatives for the proposed anesthesia with the patient or authorized representative who has indicated his/her understanding and acceptance.   Dental advisory given  Plan Discussed with: CRNA  Anesthesia Plan Comments:        Anesthesia Quick Evaluation

## 2017-09-28 ENCOUNTER — Encounter (HOSPITAL_COMMUNITY)
Admission: AD | Disposition: A | Discharge status: Home Health | Payer: Self-pay | Source: Ambulatory Visit | Attending: Urology

## 2017-09-28 ENCOUNTER — Encounter (HOSPITAL_COMMUNITY): Payer: Self-pay | Admitting: Anesthesiology

## 2017-09-28 ENCOUNTER — Inpatient Hospital Stay (HOSPITAL_COMMUNITY): Payer: Medicare Other | Admitting: Certified Registered Nurse Anesthetist

## 2017-09-28 DIAGNOSIS — Z936 Other artificial openings of urinary tract status: Secondary | ICD-10-CM

## 2017-09-28 HISTORY — PX: CYSTOSCOPY WITH INJECTION: SHX1424

## 2017-09-28 LAB — TYPE AND SCREEN
ABO/RH(D): O POS
Antibody Screen: NEGATIVE

## 2017-09-28 LAB — HEMOGLOBIN AND HEMATOCRIT, BLOOD
HEMATOCRIT: 33.8 % — AB (ref 39.0–52.0)
Hemoglobin: 11.1 g/dL — ABNORMAL LOW (ref 13.0–17.0)

## 2017-09-28 LAB — SURGICAL PCR SCREEN
MRSA, PCR: NEGATIVE
Staphylococcus aureus: NEGATIVE

## 2017-09-28 LAB — ABO/RH: ABO/RH(D): O POS

## 2017-09-28 SURGERY — ROBOT ASSISTED LAPAROSCOPIC RADICAL CYSTOPROSTATECTOMY BILATERAL PELVIC LYMPHADENECTOMY,ORTHOTOPIC NEOBLADDER
Anesthesia: General

## 2017-09-28 MED ORDER — LACTATED RINGERS IV SOLN
INTRAVENOUS | Status: DC | PRN
  Administered 2017-09-28 (×2): via INTRAVENOUS

## 2017-09-28 MED ORDER — ALVIMOPAN 12 MG PO CAPS: 12.0000 mg | ORAL_CAPSULE | ORAL | Status: DC

## 2017-09-28 MED ORDER — PIPERACILLIN-TAZOBACTAM 3.375 G IVPB
INTRAVENOUS | Status: AC
  Filled 2017-09-28: qty 50

## 2017-09-28 MED ORDER — MIDAZOLAM HCL 5 MG/5ML IJ SOLN
INTRAMUSCULAR | Status: DC | PRN
  Administered 2017-09-28: 2 mg via INTRAVENOUS

## 2017-09-28 MED ORDER — METRONIDAZOLE 500 MG PO TABS
500.0000 mg | ORAL_TABLET | Freq: Three times a day (TID) | ORAL | Status: AC
  Administered 2017-09-28 – 2017-09-29 (×3): 500 mg via ORAL
  Filled 2017-09-28 (×3): qty 1

## 2017-09-28 MED ORDER — POTASSIUM CHLORIDE IN NACL 20-0.45 MEQ/L-% IV SOLN
INTRAVENOUS | Status: DC
  Administered 2017-09-28: 100 mL/h via INTRAVENOUS
  Administered 2017-09-29: 18:00:00 via INTRAVENOUS
  Administered 2017-09-29: 100 mL/h via INTRAVENOUS
  Administered 2017-09-30 – 2017-10-01 (×3): via INTRAVENOUS
  Filled 2017-09-28 (×7): qty 1000

## 2017-09-28 MED ORDER — SUGAMMADEX SODIUM 200 MG/2ML IV SOLN
INTRAVENOUS | Status: AC
  Filled 2017-09-28: qty 2

## 2017-09-28 MED ORDER — FENTANYL CITRATE (PF) 100 MCG/2ML IJ SOLN
INTRAMUSCULAR | Status: AC
  Filled 2017-09-28: qty 2

## 2017-09-28 MED ORDER — HYDROMORPHONE HCL-NACL 0.5-0.9 MG/ML-% IV SOSY
PREFILLED_SYRINGE | INTRAVENOUS | Status: AC
  Filled 2017-09-28: qty 4

## 2017-09-28 MED ORDER — POTASSIUM CHLORIDE IN NACL 20-0.45 MEQ/L-% IV SOLN: INTRAVENOUS | Status: DC

## 2017-09-28 MED ORDER — HYDROMORPHONE HCL-NACL 0.5-0.9 MG/ML-% IV SOSY
PREFILLED_SYRINGE | INTRAVENOUS | Status: AC
  Filled 2017-09-28: qty 2

## 2017-09-28 MED ORDER — ONDANSETRON HCL 4 MG/2ML IJ SOLN
4.0000 mg | Freq: Four times a day (QID) | INTRAMUSCULAR | Status: DC | PRN
  Administered 2017-09-28: 4 mg via INTRAVENOUS
  Filled 2017-09-28: qty 2

## 2017-09-28 MED ORDER — HYDROMORPHONE HCL-NACL 0.5-0.9 MG/ML-% IV SOSY
0.2500 mg | PREFILLED_SYRINGE | INTRAVENOUS | Status: DC | PRN
  Administered 2017-09-28 (×5): 0.5 mg via INTRAVENOUS

## 2017-09-28 MED ORDER — ALVIMOPAN 12 MG PO CAPS: 12.0000 mg | ORAL_CAPSULE | Freq: Two times a day (BID) | ORAL | Status: DC

## 2017-09-28 MED ORDER — DIPHENHYDRAMINE HCL 12.5 MG/5ML PO ELIX: 12.5000 mg | ORAL_SOLUTION | Freq: Four times a day (QID) | ORAL | Status: DC | PRN

## 2017-09-28 MED ORDER — BUPIVACAINE LIPOSOME 1.3 % IJ SUSP
INTRAMUSCULAR | Status: DC | PRN
Start: 2017-09-28 — End: 2017-09-28
  Administered 2017-09-28: 20 mL

## 2017-09-28 MED ORDER — OXYCODONE HCL 5 MG PO TABS
5.0000 mg | ORAL_TABLET | ORAL | Status: DC | PRN
  Filled 2017-09-28: qty 1

## 2017-09-28 MED ORDER — LACTATED RINGERS IR SOLN
Status: DC | PRN
  Administered 2017-09-28: 1000 mL

## 2017-09-28 MED ORDER — PROPOFOL 10 MG/ML IV BOLUS
INTRAVENOUS | Status: DC | PRN
  Administered 2017-09-28: 130 mg via INTRAVENOUS

## 2017-09-28 MED ORDER — NEOMYCIN SULFATE 500 MG PO TABS
1000.0000 mg | ORAL_TABLET | Freq: Three times a day (TID) | ORAL | Status: AC
  Administered 2017-09-28 – 2017-09-29 (×2): 1000 mg via ORAL
  Filled 2017-09-28 (×4): qty 2

## 2017-09-28 MED ORDER — STERILE WATER FOR IRRIGATION IR SOLN
Status: DC | PRN
  Administered 2017-09-28: 1000 mL

## 2017-09-28 MED ORDER — ROCURONIUM BROMIDE 50 MG/5ML IV SOSY
PREFILLED_SYRINGE | INTRAVENOUS | Status: AC
  Filled 2017-09-28: qty 5

## 2017-09-28 MED ORDER — LIDOCAINE HCL (CARDIAC) 20 MG/ML IV SOLN
INTRAVENOUS | Status: DC | PRN
  Administered 2017-09-28: 60 mg via INTRAVENOUS

## 2017-09-28 MED ORDER — LACTATED RINGERS IV SOLN
INTRAVENOUS | Status: DC | PRN
  Administered 2017-09-28: 08:00:00 via INTRAVENOUS

## 2017-09-28 MED ORDER — PHENYLEPHRINE 40 MCG/ML (10ML) SYRINGE FOR IV PUSH (FOR BLOOD PRESSURE SUPPORT)
PREFILLED_SYRINGE | INTRAVENOUS | Status: AC
  Filled 2017-09-28: qty 10

## 2017-09-28 MED ORDER — DEXAMETHASONE SODIUM PHOSPHATE 10 MG/ML IJ SOLN
INTRAMUSCULAR | Status: DC | PRN
  Administered 2017-09-28: 10 mg via INTRAVENOUS

## 2017-09-28 MED ORDER — SODIUM CHLORIDE 0.9% FLUSH: 9.0000 mL | INTRAVENOUS | Status: DC | PRN

## 2017-09-28 MED ORDER — PROPOFOL 10 MG/ML IV BOLUS
INTRAVENOUS | Status: AC
  Filled 2017-09-28: qty 40

## 2017-09-28 MED ORDER — DIPHENHYDRAMINE HCL 50 MG/ML IJ SOLN: 12.5000 mg | Freq: Four times a day (QID) | INTRAMUSCULAR | Status: DC | PRN

## 2017-09-28 MED ORDER — PHENYLEPHRINE 40 MCG/ML (10ML) SYRINGE FOR IV PUSH (FOR BLOOD PRESSURE SUPPORT)
PREFILLED_SYRINGE | INTRAVENOUS | Status: DC | PRN
  Administered 2017-09-28 (×3): 40 ug via INTRAVENOUS

## 2017-09-28 MED ORDER — ROCURONIUM BROMIDE 100 MG/10ML IV SOLN
INTRAVENOUS | Status: DC | PRN
  Administered 2017-09-28: 20 mg via INTRAVENOUS
  Administered 2017-09-28: 50 mg via INTRAVENOUS
  Administered 2017-09-28 (×4): 20 mg via INTRAVENOUS

## 2017-09-28 MED ORDER — ACETAMINOPHEN 325 MG PO TABS: 650.0000 mg | ORAL_TABLET | ORAL | Status: DC | PRN

## 2017-09-28 MED ORDER — MIDAZOLAM HCL 2 MG/2ML IJ SOLN
INTRAMUSCULAR | Status: AC
  Filled 2017-09-28: qty 2

## 2017-09-28 MED ORDER — HEPARIN SODIUM (PORCINE) 5000 UNIT/ML IJ SOLN
5000.0000 [IU] | Freq: Three times a day (TID) | INTRAMUSCULAR | Status: DC
  Administered 2017-09-29 – 2017-10-03 (×14): 5000 [IU] via SUBCUTANEOUS
  Filled 2017-09-28 (×14): qty 1

## 2017-09-28 MED ORDER — ROCURONIUM BROMIDE 50 MG/5ML IV SOSY
PREFILLED_SYRINGE | INTRAVENOUS | Status: AC
  Filled 2017-09-28: qty 10

## 2017-09-28 MED ORDER — BUPIVACAINE LIPOSOME 1.3 % IJ SUSP
20.0000 mL | Freq: Once | INTRAMUSCULAR | Status: DC
Start: 2017-09-28 — End: 2017-09-28
  Filled 2017-09-28: qty 20

## 2017-09-28 MED ORDER — FENTANYL CITRATE (PF) 250 MCG/5ML IJ SOLN
INTRAMUSCULAR | Status: AC
  Filled 2017-09-28: qty 5

## 2017-09-28 MED ORDER — DEXAMETHASONE SODIUM PHOSPHATE 10 MG/ML IJ SOLN
INTRAMUSCULAR | Status: AC
  Filled 2017-09-28: qty 1

## 2017-09-28 MED ORDER — NALOXONE HCL 0.4 MG/ML IJ SOLN: 0.4000 mg | INTRAMUSCULAR | Status: DC | PRN

## 2017-09-28 MED ORDER — HYDROMORPHONE 1 MG/ML IV SOLN
INTRAVENOUS | Status: DC
  Administered 2017-09-28: 25 mg via INTRAVENOUS
  Administered 2017-09-29: 0.6 mg via INTRAVENOUS
  Administered 2017-09-29: 0 mg via INTRAVENOUS
  Administered 2017-09-29 (×4): 0.3 mg via INTRAVENOUS
  Administered 2017-09-29: 0.6 mg via INTRAVENOUS
  Administered 2017-09-30: 0 mg via INTRAVENOUS
  Administered 2017-09-30: 0.3 mg via INTRAVENOUS
  Filled 2017-09-28: qty 25

## 2017-09-28 MED ORDER — ONDANSETRON HCL 4 MG/2ML IJ SOLN
INTRAMUSCULAR | Status: DC | PRN
  Administered 2017-09-28: 4 mg via INTRAVENOUS

## 2017-09-28 MED ORDER — SUGAMMADEX SODIUM 200 MG/2ML IV SOLN
INTRAVENOUS | Status: DC | PRN
  Administered 2017-09-28: 200 mg via INTRAVENOUS

## 2017-09-28 MED ORDER — ACETAMINOPHEN 10 MG/ML IV SOLN
INTRAVENOUS | Status: AC
  Administered 2017-09-28: 1000 mg
  Filled 2017-09-28: qty 100

## 2017-09-28 MED ORDER — LIDOCAINE HCL 2 % IJ SOLN
INTRAMUSCULAR | Status: AC
  Filled 2017-09-28: qty 20

## 2017-09-28 MED ORDER — DEXTROSE-NACL 5-0.45 % IV SOLN
INTRAVENOUS | Status: DC
  Administered 2017-09-28: 16:00:00 via INTRAVENOUS

## 2017-09-28 MED ORDER — LIDOCAINE 2% (20 MG/ML) 5 ML SYRINGE
INTRAMUSCULAR | Status: AC
  Filled 2017-09-28: qty 5

## 2017-09-28 MED ORDER — LIDOCAINE 2% (20 MG/ML) 5 ML SYRINGE
INTRAMUSCULAR | Status: DC | PRN
  Administered 2017-09-28: 1 mg/kg/h via INTRAVENOUS

## 2017-09-28 MED ORDER — ACETAMINOPHEN 10 MG/ML IV SOLN: 1000.0000 mg | Freq: Four times a day (QID) | INTRAVENOUS | Status: DC

## 2017-09-28 MED ORDER — FENTANYL CITRATE (PF) 100 MCG/2ML IJ SOLN
INTRAMUSCULAR | Status: DC | PRN
  Administered 2017-09-28 (×2): 50 ug via INTRAVENOUS
  Administered 2017-09-28: 100 ug via INTRAVENOUS
  Administered 2017-09-28 (×3): 50 ug via INTRAVENOUS

## 2017-09-28 MED ORDER — ONDANSETRON HCL 4 MG/2ML IJ SOLN
INTRAMUSCULAR | Status: AC
  Filled 2017-09-28: qty 2

## 2017-09-28 MED ORDER — SODIUM CHLORIDE 0.9 % IJ SOLN
INTRAMUSCULAR | Status: AC
  Filled 2017-09-28: qty 20

## 2017-09-28 SURGICAL SUPPLY — 89 items
APPLICATOR COTTON TIP 6IN STRL (MISCELLANEOUS) ×3 IMPLANT
APPLICATOR SURGIFLO ENDO (HEMOSTASIS) IMPLANT
BAG LAPAROSCOPIC 12 15 PORT 16 (BASKET) ×1 IMPLANT
BAG RETRIEVAL 12/15 (BASKET) ×2
BAG RETRIEVAL 12/15MM (BASKET) ×1
BAG URO CATCHER STRL LF (MISCELLANEOUS) IMPLANT
BLADE SURG SZ10 CARB STEEL (BLADE) IMPLANT
CATH FOLEY 2WAY SLVR 18FR 30CC (CATHETERS) ×3 IMPLANT
CELLS DAT CNTRL 66122 CELL SVR (MISCELLANEOUS) ×1 IMPLANT
CHLORAPREP W/TINT 26ML (MISCELLANEOUS) ×3 IMPLANT
CLIP VESOLOCK LG 6/CT PURPLE (CLIP) ×12 IMPLANT
CLIP VESOLOCK MED LG 6/CT (CLIP) ×3 IMPLANT
CLIP VESOLOCK XL 6/CT (CLIP) ×6 IMPLANT
CLOTH BEACON ORANGE TIMEOUT ST (SAFETY) ×3 IMPLANT
COVER FOOTSWITCH UNIV (MISCELLANEOUS) IMPLANT
COVER SURGICAL LIGHT HANDLE (MISCELLANEOUS) ×3 IMPLANT
COVER TIP SHEARS 8 DVNC (MISCELLANEOUS) ×1 IMPLANT
COVER TIP SHEARS 8MM DA VINCI (MISCELLANEOUS) ×2
DECANTER SPIKE VIAL GLASS SM (MISCELLANEOUS) ×3 IMPLANT
DERMABOND ADVANCED (GAUZE/BANDAGES/DRESSINGS) ×2
DERMABOND ADVANCED .7 DNX12 (GAUZE/BANDAGES/DRESSINGS) ×1 IMPLANT
DRAIN PENROSE 18X1/2 LTX STRL (DRAIN) IMPLANT
DRAPE ARM DVNC X/XI (DISPOSABLE) ×4 IMPLANT
DRAPE COLUMN DVNC XI (DISPOSABLE) ×1 IMPLANT
DRAPE DA VINCI XI ARM (DISPOSABLE) ×8
DRAPE DA VINCI XI COLUMN (DISPOSABLE) ×2
ELECT CAUTERY BLADE 6.4 (BLADE) ×3 IMPLANT
ELECT REM PT RETURN 15FT ADLT (MISCELLANEOUS) ×3 IMPLANT
GLOVE BIO SURGEON STRL SZ 6.5 (GLOVE) ×4 IMPLANT
GLOVE BIO SURGEONS STRL SZ 6.5 (GLOVE) ×2
GLOVE BIOGEL M STRL SZ7.5 (GLOVE) ×9 IMPLANT
GOWN STRL REUS W/TWL LRG LVL3 (GOWN DISPOSABLE) ×15 IMPLANT
GOWN STRL REUS W/TWL XL LVL3 (GOWN DISPOSABLE) ×3 IMPLANT
IRRIG SUCT STRYKERFLOW 2 WTIP (MISCELLANEOUS) ×3
IRRIGATION SUCT STRKRFLW 2 WTP (MISCELLANEOUS) ×1 IMPLANT
KIT PROCEDURE DA VINCI SI (MISCELLANEOUS)
KIT PROCEDURE DVNC SI (MISCELLANEOUS) IMPLANT
LOOP VESSEL MAXI BLUE (MISCELLANEOUS) ×3 IMPLANT
LOOP VESSEL MINI RED (MISCELLANEOUS) IMPLANT
MANIFOLD NEPTUNE II (INSTRUMENTS) ×3 IMPLANT
NEEDLE INSUFFLATION 14GA 120MM (NEEDLE) ×3 IMPLANT
PACK CYSTO (CUSTOM PROCEDURE TRAY) IMPLANT
PACK ROBOT UROLOGY CUSTOM (CUSTOM PROCEDURE TRAY) ×3 IMPLANT
PAD POSITIONING PINK XL (MISCELLANEOUS) ×3 IMPLANT
PORT ACCESS TROCAR AIRSEAL 12 (TROCAR) ×1 IMPLANT
PORT ACCESS TROCAR AIRSEAL 5M (TROCAR) ×2
RELOAD STAPLER GREEN 60MM (STAPLE) ×5 IMPLANT
RELOAD STAPLER WHITE 60MM (STAPLE) ×9 IMPLANT
RETRACTOR LONRSTAR 16.6X16.6CM (MISCELLANEOUS) IMPLANT
RETRACTOR STAY HOOK 5MM (MISCELLANEOUS) IMPLANT
RETRACTOR STER APS 16.6X16.6CM (MISCELLANEOUS)
RTRCTR WOUND ALEXIS 18CM MED (MISCELLANEOUS) ×3
SEAL CANN UNIV 5-8 DVNC XI (MISCELLANEOUS) ×4 IMPLANT
SEAL XI 5MM-8MM UNIVERSAL (MISCELLANEOUS) ×8
SOLUTION ELECTROLUBE (MISCELLANEOUS) ×3 IMPLANT
SPONGE LAP 18X18 X RAY DECT (DISPOSABLE) ×6 IMPLANT
SPONGE LAP 4X18 X RAY DECT (DISPOSABLE) ×3 IMPLANT
STAPLER ECHELON LONG 60 440 (INSTRUMENTS) ×3 IMPLANT
STAPLER RELOAD GREEN 60MM (STAPLE) ×15
STAPLER RELOAD WHITE 60MM (STAPLE) ×27
STENT SET URETHERAL LEFT 7FR (STENTS) ×3 IMPLANT
STENT SET URETHERAL RIGHT 7FR (STENTS) ×3 IMPLANT
SURGIFLO W/THROMBIN 8M KIT (HEMOSTASIS) IMPLANT
SUT CHROMIC 4 0 RB 1X27 (SUTURE) IMPLANT
SUT ETHIBOND CT1 BRD #0 30IN (SUTURE) ×9 IMPLANT
SUT ETHILON 3 0 PS 1 (SUTURE) ×3 IMPLANT
SUT MNCRL AB 4-0 PS2 18 (SUTURE) ×6 IMPLANT
SUT PDS AB 0 CTX 36 PDP370T (SUTURE) ×9 IMPLANT
SUT SILK 3 0 SH 30 (SUTURE) IMPLANT
SUT SILK 3 0 SH CR/8 (SUTURE) ×3 IMPLANT
SUT VIC AB 2-0 SH 18 (SUTURE) IMPLANT
SUT VIC AB 2-0 UR5 27 (SUTURE) ×12 IMPLANT
SUT VIC AB 3-0 SH 27 (SUTURE) ×12
SUT VIC AB 3-0 SH 27X BRD (SUTURE) ×2 IMPLANT
SUT VIC AB 3-0 SH 27XBRD (SUTURE) ×4 IMPLANT
SUT VIC AB 4-0 RB1 27 (SUTURE) ×10
SUT VIC AB 4-0 RB1 27XBRD (SUTURE) ×5 IMPLANT
SUT VLOC BARB 180 ABS3/0GR12 (SUTURE) ×3
SUTURE VLOC BRB 180 ABS3/0GR12 (SUTURE) ×1 IMPLANT
SYR CONTROL 10ML LL (SYRINGE) IMPLANT
SYSTEM UROSTOMY GENTLE TOUCH (WOUND CARE) ×3 IMPLANT
TOWEL OR NON WOVEN STRL DISP B (DISPOSABLE) ×3 IMPLANT
TROCAR BLADELESS 15MM (ENDOMECHANICALS) ×3 IMPLANT
TUBING CONNECTING 10 (TUBING) ×2 IMPLANT
TUBING CONNECTING 10' (TUBING) ×1
VESSEL LOOPS MINI RED (MISCELLANEOUS)
WATER STERILE IRR 1000ML POUR (IV SOLUTION) ×6 IMPLANT
WATER STERILE IRR 3000ML UROMA (IV SOLUTION) ×3 IMPLANT
YANKAUER SUCT BULB TIP 10FT TU (MISCELLANEOUS) ×3 IMPLANT

## 2017-09-28 NOTE — Brief Op Note (Signed)
09/27/2017 - 09/28/2017  12:51 PM  PATIENT:  Randy Wheeler  71 y.o. male  PRE-OPERATIVE DIAGNOSIS:  BLADDER CANCER  POST-OPERATIVE DIAGNOSIS:  BLADDER CANCER  PROCEDURE:  Procedure(s): ROBOT ASSISTED LAPAROSCOPIC RADICAL CYSTOPROSTATECTOMY BILATERAL PELVIC LYMPHADENECTOMY,ILEAL CONDUIT DIVERSION (N/A) CYSTOSCOPY WITH INJECTION OF INDOCYANINE GREEN DYE (N/A)  SURGEON:  Surgeon(s) and Role:    * Alexis Frock, MD - Primary  PHYSICIAN ASSISTANT:   ASSISTANTS: Debbrah Alar PA   ANESTHESIA:   local and general  EBL:  Total I/O In: 2000 [I.V.:2000] Out: 200 [Blood:200]  BLOOD ADMINISTERED:none  DRAINS: 1 - JP to bulb; 2 - Urostomy to gravity   LOCAL MEDICATIONS USED:  MARCAINE     SPECIMEN:  Source of Specimen:  1 - ureteral margins, 2 - pelvic lymph nodes, 3- cystoprostatectomy  DISPOSITION OF SPECIMEN:  PATHOLOGY  COUNTS:  YES  TOURNIQUET:  * No tourniquets in log *  DICTATION: .Other Dictation: Dictation Number [id number]  PLAN OF CARE: Admit to inpatient   PATIENT DISPOSITION:  PACU - hemodynamically stable.   Delay start of Pharmacological VTE agent (>24hrs) due to surgical blood loss or risk of bleeding: no

## 2017-09-28 NOTE — Progress Notes (Signed)
Day of Surgery   Subjective/Chief Complaint:   1 - Bladder Cancer - admitted 10/11 for bowel prep and stomal marking prior to planned cystoprostatectomy with ICG sentinel + templante pelvic lymphadenecotmy and ileal conduit diverision.  Today "Randy Wheeler" is ready for cystectomy. He completed bowel prep to clear and had stomal marking.  Hgb 11.6, Cr 1.23.    Objective: Vital signs in last 24 hours: Temp:  [97.5 F (36.4 C)-97.8 F (36.6 C)] 97.6 F (36.4 C) (10/12 0531) Pulse Rate:  [88-97] 88 (10/12 0531) Resp:  [18] 18 (10/12 0531) BP: (93-149)/(68-91) 93/68 (10/12 0531) SpO2:  [97 %-99 %] 97 % (10/12 0531) Last BM Date: 09/27/17  Intake/Output from previous day: 10/11 0701 - 10/12 0700 In: 850 [I.V.:850] Out: -  Intake/Output this shift: Total I/O In: 400 [I.V.:400] Out: -   Physical Exam  Constitutional: He appears well-developed.  HENT:  Head: Normocephalic.  Eyes: Pupils are equal, round, and reactive to light.  Neck: Normal range of motion.  Cardiovascular: Normal rate.   Respiratory: Effort normal.  GI: Soft.  Genitourinary:  Genitourinary Comments: No CVAT. Small, reducible umbilical hernia noted. RLQ urostomy marking site noted.  Musculoskeletal: Normal range of motion.  Neurological: He is alert.  Skin: Skin is warm.  Psychiatric: He has a normal mood and affect.   Lab Results:   Recent Labs  09/27/17 1402  WBC 5.1  HGB 11.6*  HCT 35.5*  PLT 188   BMET  Recent Labs  09/27/17 1402  NA 138  K 3.6  CL 102  CO2 27  GLUCOSE 153*  BUN 28*  CREATININE 1.23  CALCIUM 8.8*   PT/INR No results for input(s): LABPROT, INR in the last 72 hours. ABG No results for input(s): PHART, HCO3 in the last 72 hours.  Invalid input(s): PCO2, PO2  Studies/Results: No results found.  Anti-infectives: Anti-infectives    Start     Dose/Rate Route Frequency Ordered Stop   09/28/17 0600  piperacillin-tazobactam (ZOSYN) IVPB 3.375 g     3.375 g 100 mL/hr  over 30 Minutes Intravenous 30 min pre-op 09/27/17 1220        Assessment/Plan:  1 - Bladder Cancer - proceed as planned today with curative intent cystoprostatectomy. Risks, benefits, alternatives, expected peri-op course reiterated again today. He has very good understanding.   [facility], Olamide Lahaie 09/28/2017

## 2017-09-28 NOTE — Anesthesia Postprocedure Evaluation (Signed)
Anesthesia Post Note  Patient: Randy Wheeler  Procedure(s) Performed: ROBOT ASSISTED LAPAROSCOPIC RADICAL CYSTOPROSTATECTOMY BILATERAL PELVIC LYMPHADENECTOMY,ILEAL CONDUIT DIVERSION (N/A ) CYSTOSCOPY WITH INJECTION OF INDOCYANINE GREEN DYE (N/A )     Patient location during evaluation: PACU Anesthesia Type: General Level of consciousness: awake and alert Pain management: pain level controlled Vital Signs Assessment: post-procedure vital signs reviewed and stable Respiratory status: spontaneous breathing, nonlabored ventilation, respiratory function stable and patient connected to nasal cannula oxygen Cardiovascular status: blood pressure returned to baseline and stable Postop Assessment: no apparent nausea or vomiting Anesthetic complications: no    Last Vitals:  Vitals:   09/28/17 1410 09/28/17 1415  BP:    Pulse:  99  Resp: 12 13  Temp:    SpO2: 100% 97%    Last Pain:  Vitals:   09/28/17 0531  TempSrc: Oral                 Malka Bocek,W. EDMOND

## 2017-09-28 NOTE — H&P (View-Only) (Signed)
Day of Surgery   Subjective/Chief Complaint:   1 - Bladder Cancer - admitted 10/11 for bowel prep and stomal marking prior to planned cystoprostatectomy with ICG sentinel + templante pelvic lymphadenecotmy and ileal conduit diverision.  Today "Randy Wheeler" is ready for cystectomy. He completed bowel prep to clear and had stomal marking.  Hgb 11.6, Cr 1.23.    Objective: Vital signs in last 24 hours: Temp:  [97.5 F (36.4 C)-97.8 F (36.6 C)] 97.6 F (36.4 C) (10/12 0531) Pulse Rate:  [88-97] 88 (10/12 0531) Resp:  [18] 18 (10/12 0531) BP: (93-149)/(68-91) 93/68 (10/12 0531) SpO2:  [97 %-99 %] 97 % (10/12 0531) Last BM Date: 09/27/17  Intake/Output from previous day: 10/11 0701 - 10/12 0700 In: 850 [I.V.:850] Out: -  Intake/Output this shift: Total I/O In: 400 [I.V.:400] Out: -   Physical Exam  Constitutional: He appears well-developed.  HENT:  Head: Normocephalic.  Eyes: Pupils are equal, round, and reactive to light.  Neck: Normal range of motion.  Cardiovascular: Normal rate.   Respiratory: Effort normal.  GI: Soft.  Genitourinary:  Genitourinary Comments: No CVAT. Small, reducible umbilical hernia noted. RLQ urostomy marking site noted.  Musculoskeletal: Normal range of motion.  Neurological: He is alert.  Skin: Skin is warm.  Psychiatric: He has a normal mood and affect.   Lab Results:   Recent Labs  09/27/17 1402  WBC 5.1  HGB 11.6*  HCT 35.5*  PLT 188   BMET  Recent Labs  09/27/17 1402  NA 138  K 3.6  CL 102  CO2 27  GLUCOSE 153*  BUN 28*  CREATININE 1.23  CALCIUM 8.8*   PT/INR No results for input(s): LABPROT, INR in the last 72 hours. ABG No results for input(s): PHART, HCO3 in the last 72 hours.  Invalid input(s): PCO2, PO2  Studies/Results: No results found.  Anti-infectives: Anti-infectives    Start     Dose/Rate Route Frequency Ordered Stop   09/28/17 0600  piperacillin-tazobactam (ZOSYN) IVPB 3.375 g     3.375 g 100 mL/hr  over 30 Minutes Intravenous 30 min pre-op 09/27/17 1220        Assessment/Plan:  1 - Bladder Cancer - proceed as planned today with curative intent cystoprostatectomy. Risks, benefits, alternatives, expected peri-op course reiterated again today. He has very good understanding.   [facility], Kiyla Ringler 09/28/2017

## 2017-09-28 NOTE — Transfer of Care (Signed)
Immediate Anesthesia Transfer of Care Note  Patient: Randy Wheeler  Procedure(s) Performed: Procedure(s): ROBOT ASSISTED LAPAROSCOPIC RADICAL CYSTOPROSTATECTOMY BILATERAL PELVIC LYMPHADENECTOMY,ILEAL CONDUIT DIVERSION (N/A) CYSTOSCOPY WITH INJECTION OF INDOCYANINE GREEN DYE (N/A)  Patient Location: PACU  Anesthesia Type:General  Level of Consciousness:  sedated, patient cooperative and responds to stimulation  Airway & Oxygen Therapy:Patient Spontanous Breathing and Patient connected to face mask oxgen  Post-op Assessment:  Report given to PACU RN and Post -op Vital signs reviewed and stable  Post vital signs:  Reviewed and stable  Last Vitals:  Vitals:   09/27/17 2004 09/28/17 0531  BP: (!) 149/91 93/68  Pulse: 93 88  Resp: 18 18  Temp: 36.6 C 36.4 C  SpO2: 33% 43%    Complications: No apparent anesthesia complications

## 2017-09-28 NOTE — Interval H&P Note (Signed)
History and Physical Interval Note:  09/28/2017 7:27 AM  Randy Wheeler  has presented today for surgery, with the diagnosis of BLADDER CANCER  The various methods of treatment have been discussed with the patient and family. After consideration of risks, benefits and other options for treatment, the patient has consented to  Procedure(s): ROBOT ASSISTED LAPAROSCOPIC RADICAL CYSTOPROSTATECTOMY BILATERAL PELVIC LYMPHADENECTOMY,ILEAL CONDUIT DIVERSION (N/A) CYSTOSCOPY WITH INJECTION OF INDOCYANINE GREEN DYE (N/A) as a surgical intervention .  The patient's history has been reviewed, patient examined, no change in status, stable for surgery.  I have reviewed the patient's chart and labs.  Questions were answered to the patient's satisfaction.     Savio Albrecht

## 2017-09-28 NOTE — Anesthesia Procedure Notes (Signed)
Procedure Name: Intubation Date/Time: 09/28/2017 7:46 AM Performed by: Sherida Dobkins, Virgel Gess Pre-anesthesia Checklist: Patient identified, Emergency Drugs available, Suction available, Patient being monitored and Timeout performed Patient Re-evaluated:Patient Re-evaluated prior to induction Oxygen Delivery Method: Circle system utilized Preoxygenation: Pre-oxygenation with 100% oxygen Induction Type: IV induction Ventilation: Mask ventilation without difficulty Laryngoscope Size: Mac and 4 Grade View: Grade II Tube type: Oral Tube size: 7.5 mm Number of attempts: 1 Airway Equipment and Method: Stylet Placement Confirmation: ETT inserted through vocal cords under direct vision,  positive ETCO2,  CO2 detector and breath sounds checked- equal and bilateral Secured at: 22 cm Tube secured with: Tape Dental Injury: Teeth and Oropharynx as per pre-operative assessment

## 2017-09-28 NOTE — Anesthesia Procedure Notes (Signed)
Arterial Line Insertion Start/End10/11/2017 7:17 AM, 09/28/2017 7:27 AM Performed by: Roderic Palau, anesthesiologist  Patient location: Pre-op. Preanesthetic checklist: patient identified, IV checked, site marked, risks and benefits discussed, surgical consent, monitors and equipment checked, pre-op evaluation, timeout performed and anesthesia consent Lidocaine 1% used for infiltration Left, radial was placed Catheter size: 20 G Hand hygiene performed  and maximum sterile barriers used   Attempts: 1 Procedure performed using ultrasound guided technique. Ultrasound Notes:anatomy identified, needle tip was noted to be adjacent to the nerve/plexus identified and no ultrasound evidence of intravascular and/or intraneural injection Following insertion, dressing applied and Biopatch. Post procedure assessment: normal and unchanged  Patient tolerated the procedure well with no immediate complications.

## 2017-09-29 ENCOUNTER — Encounter (HOSPITAL_COMMUNITY): Payer: Self-pay | Admitting: Urology

## 2017-09-29 LAB — CBC
HCT: 32.1 % — ABNORMAL LOW (ref 39.0–52.0)
HEMOGLOBIN: 10.6 g/dL — AB (ref 13.0–17.0)
MCH: 31.6 pg (ref 26.0–34.0)
MCHC: 33 g/dL (ref 30.0–36.0)
MCV: 95.8 fL (ref 78.0–100.0)
Platelets: 201 10*3/uL (ref 150–400)
RBC: 3.35 MIL/uL — AB (ref 4.22–5.81)
RDW: 13.6 % (ref 11.5–15.5)
WBC: 9 10*3/uL (ref 4.0–10.5)

## 2017-09-29 LAB — BASIC METABOLIC PANEL
ANION GAP: 8 (ref 5–15)
BUN: 24 mg/dL — ABNORMAL HIGH (ref 6–20)
CALCIUM: 8.5 mg/dL — AB (ref 8.9–10.3)
CO2: 26 mmol/L (ref 22–32)
Chloride: 103 mmol/L (ref 101–111)
Creatinine, Ser: 1.13 mg/dL (ref 0.61–1.24)
GFR calc Af Amer: >60 mL/min (ref >60)
Glucose, Bld: 168 mg/dL — ABNORMAL HIGH (ref 65–99)
Potassium: 4.2 mmol/L (ref 3.5–5.1)
Sodium: 137 mmol/L (ref 135–145)

## 2017-09-29 LAB — GLUCOSE, CAPILLARY: GLUCOSE-CAPILLARY: 278 mg/dL — AB (ref 65–99)

## 2017-09-29 MED ORDER — ALUM & MAG HYDROXIDE-SIMETH 200-200-20 MG/5ML PO SUSP
15.0000 mL | Freq: Four times a day (QID) | ORAL | Status: AC | PRN
  Administered 2017-09-29 (×2): 15 mL via ORAL
  Filled 2017-09-29 (×2): qty 30

## 2017-09-29 MED ORDER — ACETAMINOPHEN 500 MG PO TABS
1000.0000 mg | ORAL_TABLET | Freq: Four times a day (QID) | ORAL | Status: DC
  Administered 2017-09-29 – 2017-10-03 (×14): 1000 mg via ORAL
  Filled 2017-09-29 (×16): qty 2

## 2017-09-29 NOTE — Op Note (Signed)
Randy Wheeler, Randy Wheeler NO.:  [id number]  MEDICAL RECORD NO.:  [id number]  LOCATION:  8916                         FACILITY:  [facility]  PHYSICIAN:  Randy Frock, MD     DATE OF BIRTH:  [birth year]  DATE OF PROCEDURE: 09/28/2017                              OPERATIVE REPORT   DIAGNOSIS:  High-grade muscle invasive bladder cancer.  PROCEDURES: 1. Cystoscopy with indocyanine green dye injection. 2. Robotic cystoprostatectomy with bilateral pelvic lymph node     dissection and ileal conduit urinary diversion.  ESTIMATED BLOOD LOSS:  150 mL.  COMPLICATION:  None.  SPECIMENS: 1. Right distal ureteral margin negative for frozen section. 2. Left distal ureteral margin negative for frozen section. 3. Right final ureteral margin. 4. Left final ureteral margin. 5. Cystoprostatectomy. 6. Right external iliac lymph nodes, sentinel. 7. Right obturator lymph nodes, sentinel. 8. Right common iliac lymph nodes, sentinel. 9. Left external iliac lymph nodes. 10.Left obturator lymph nodes. 11.Left common iliac lymph nodes. 12.Final right ureteral margin. 13.Final left ureteral margin.  FINDINGS:  Sentinel lymph nodes seen within the right hemipelvis and the obturator, external iliac and common iliac lymph node groups respectively.  ASSISTANT:  Randy Alar, PA.  DRAINS: 1. Jackson-Pratt drain to bulb suction. 2. Urostomy to gravity drainage with Bander stents; right is red, left     is blue.  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  Options were discussed for management and he wished to proceed with curative intent pathway with neoadjuvant chemo followed by cystoprostatectomy.  He underwent neoadjuvant chemotherapy, which he tolerated well.  Repeat staging imaging following revealed no evidence of disease progression and he  was referred for consideration of surgery.  He was felt to be a suitable candidate and he wished to proceed.  He was admitted 1 day prior for bowel prep, stomal marking and labs.  Informed consent was obtained and placed in the medical record.  PROCEDURE IN DETAIL:  The patient being Randy Wheeler, was verified. Procedure being robotic cystoprostatectomy was confirmed.  Procedure was carried out.  Time-out was performed.  Intravenous antibiotics were administered.  General endotracheal anesthesia was introduced.  The patient was placed into a low lithotomy position and sterile field was created by prepping and draping the patient's penis, perineum and proximal thighs using iodine and his infra-xiphoid abdomen using chlorhexidine gluconate after clipper shaving.  His arms were tucked to the side with gel rolls in the lateral aspect of his knees were padded. He was further fashioned to the operating table using 3-inch tape over foam padding across his supraxiphoid chest.  A test of steep Trendelenburg positioning was performed, he was found to be suitably positioned.  Next, cystourethroscopy was performed using a 26-French injection scope set with visual obturator.  Inspection of the anterior and posterior urethra were unremarkable.  Inspection of the urinary bladder revealed some relatively small volume right lateral and bladder neck papillary tissue consistent with known bladder cancer.  Next, 2 mL of indocyanine green dye was injected into small aliquots as mucosal blebs around the areas of  visible tumor.  Foley catheter was placed per urethra to straight drain.  Next, a high-flow, low-pressure pneumoperitoneum was obtained using Veress technique in the supraumbilical midline having passed the aspiration and drop test.  An 8- mm robotic camera port was placed in the same location.  Laparoscopic examination of the peritoneal cavity revealed no significant adhesions and no visceral  injury.  Additional ports were then placed as follows: Right paramedian 15 mm assistant port to the site of the previous marked stoma, right paramedian 8-mm robotic port, right far lateral 12-mm AirSeal assistant port, left paramedian 8-mm robotic port, left far lateral 8-mm robotic port.  Robot was docked and passed through the electronic checks.  Initial attention was directed at development of the left retroperitoneum and ureter.  Incision was made lateral to the left medial umbilical ligament from the abdominal wall towards the area of the internal ring.  Vas deferens was purposely ligated and peritoneal flap was further developed posteriorly coursing along the iliac vessels and lateral to the descending colon.  The left ureter was encountered as it coursed over the common iliac vessels, was marked the vessel loop, dissected distally to the area of the ureterovesical junction, which doubly clipped and ligated.  The proximal clip being a tag suture. Frozen section was sent negative for carcinoma.  The left ureter was then dissected proximally for distance approximately 8 cm above the iliac crossing, taking exquisite care to avoid devascularization, which did not occur.  The left pelvic lymph node fields were inspected under fluorescence light.  There were no obvious sentinel lymph nodes seen in the left hemipelvis, there appeared to be some on the right, which were addressed at a later time.  As such, template lymphadenectomy was then performed on the left side as the left external iliac lymph node groups with confines being left external iliac artery, vein, pelvic side wall, iliac bifurcation.  Lymphostasis was achieved with cold clips.  Next, left obturator group was dissected with confines being left obturator nerve, pelvic side wall, external iliac vein.  Lymphostasis was achieved with cold clips.  Obturator nerve was inspected following these maneuvers and found to be uninjured.   Next, fibrofatty tissue overlying the left common iliac artery and vein was mobilized.  Lymphostasis was achieved with cold clips, set aside, labeled as left common iliac lymph nodes.  The lateral bladder wall swept away from the pelvis toward the area of the endopelvic fascia on the left side, which was then swept away from the lateral aspect of the prostate in a base-to-apex orientation.  Attention was directed at right-sided dissection. Similarly, a right-sided lateral peritoneal flap was developed lateral to the right medial umbilical ligament.  The right ureter was seen coursing over the iliac vessels, marked with vessel loop, dissected distally to the area of ureterovesical junction, was doubly clipped and ligated.  Frozen section sent negative for carcinoma, was dissected proximally for distance approximately 6 cm above the iliac crossing. The right pelvis was then inspected under near infrared fluorescent light.  There were multiple sentinel lymph nodes seen in the right obturator, right external iliac and a very large fusiform lymph node and the right common iliac lymph node groups.  As such, template lymphadenectomy was performed of the right external iliac, right obturator and right common iliac groups with the confines as mentioned on the left.  These were marked as sentinel.  Lymphostasis was again achieved with cold clips.  The right obturator nerve was followed, it was inspected  following these maneuvers and found to be uninjured. Next, dissection proceeded directly over the aortic bifurcation from the right side, retroperitoneal incision, developed a tunnel for the left ureter.  The left ureter was then retrieved via this and delivered to the right lower quadrant, appeared to be of adequate length.  The ileocecal junction was also identified and a tag silk suture was used to mark a segment approximately 15 cm proximal to this, did appear to have suitable mobility for  conduit.  A distal clip was applied, marked proximal-distal orientation.  Next, posterior dissection was performed by connecting the two posterior peritoneal incisions developing a posterior peritoneal flap behind the bladder and dissecting in a plane just posterior to the vas and seminal vesicles towards the area of the apex of the prostate.  This exposed the vascular pedicles of the bladder and prostate.  These were controlled using white load stapler x3 each side, taking exquisite care to avoid any rectal injury, which did not occur.  Anterior attachments were then taken down using cautery scissors.  This exposed the dorsal venous complex, which was controlled using stapler.  Final apical dissection was performed in the anterior plane, placing the cystoprostatectomy specimen on superior traction. Membranous urethra was coldly transected.  The Foley catheter was clipped proximally, ligated distally to use as a bucket-handle.  This completely freed up the cystoprostatectomy specimen, which was placed into an extra-large EndoCatch bag for later retrieval in the left hemi- abdomen.  The urethral stump was oversewn using running 3-0 V-Loc to prevent problematic postoperative drainage.  Digital rectal exam was performed using indicator glove under laparoscopic vision.  No evidence of rectal violation was noted.  A closed suction drain was brought through the previous left lateral most robotic port site near the peritoneal cavity.  The tagged right and left ureteral sutures as well as the conduit suture were marked with a laparoscopic grasper via the conduit site.  Robot was then undocked.  Specimen was retrieved by extending the camera port site inferiorly in area on the left side of the umbilicus for distance approximately 8 cm removing the cystoprostatectomy specimen, setting it aside for permanent pathology via the extraction site.  The ureters and bowel were also delivered. The ureter  was appeared to be a suitable length and the previously- marked area of conduit appeared to be acceptable as well.  As such, a 15- cm segment of bowel was taken out of continuity 10 cm proximal to the ileocecal junction using bowel load stapler proximally and distally, and mesentery further developed using 1-1/2 loads of bowel load stapler distally and 1 load proximally, taking exquisite care to avoid devascularization of the conduit or anastomotic segments, which did not occur.  The conduit was laid into retroperitoneal orientation and bowel- bowel anastomosis was performed using side-to-side staple anastomosis x2 with bowel load stapler.  The acute angle, this was bolstered with interrupted silk.  The free end was oversewn with running silk and a second imbricating layer of running silk.  The mesenteric defect was controlled using interrupted silk.  The bowel-bowel anastomosis was visibly vascular and palpably patent and was delivered back into the abdominal cavity.  Attention was directed at the ureteroileal anastomosis.  The proximal staple line of the conduit was excluded using running Vicryl.  Distal staple line removed.  A segment approximately 4 mm in length most of the serosa and mucosa was excised.  Four mucosal everting sutures were applied at the proximal end of the conduit.  The left ureter was spatulated with final margin set aside for permanent pathology.  A heel stitch was applied of 4-0 Vicryl and a blue-colored Bander stent was advanced 26 cm to anastomosis and further ureteral anastomosis was performed using two separate running suture lines of running 4-0 Vicryl, which resulted in excellent ureteroileal anastomosis on the left side without undue tension.  A single chromic stitch was applied through and through the stent and in the conduit to prevent an early inadvertent retrieval.  The right ureter was anastomosed to the contralateral side of the proximal conduit in  exact same fashion as the left with a red-colored Bander stent to 26 cm to anastomosis.  A right final margin was also sent.  Next, a quarter size diameter segment of column of skin and fat was excised from the previously-marked conduit site at the level of the fascia, which was then dilated to accommodate two surgeon's fingers and the stents in conduit segment were delivered to this.  Four anchoring sutures to the fascia and conduit were applied to prevent parastomal hernia using 2-0 Vicryl.  Next, a rose budding was performed using 2-0 Vicryl x4, resulted in excellent rose budding of the conduit segment.  Fascia was then closed using interrupted Ethibond x6. Each extraction site was closed at the level of the fascia using running Vicryl.  All incision sites were infiltrated with dilute lyophilized Marcaine and closed at the level of the skin using subcuticular Monocryl followed by Dermabond.  Drain stitch was applied on the left and the final maturation of the conduit was performed using interrupted Vicryl. Ostomy appliance was applied and procedure was terminated.  The patient tolerated the procedure well.  There were no immediate periprocedural complications.  The patient was taken to the postanesthesia care unit in stable condition with plan for step-down admission.          ______________________________ Randy Frock, MD     TM/MEDQ  D:  09/28/2017  T:  09/29/2017  Job:  [id number]

## 2017-09-29 NOTE — Progress Notes (Cosign Needed)
Urology Progress Note   1 Day Post-Op  Subjective: NAEON. No flatus or BM. Has not yet ambulated. No n/v.   Objective: Vital signs in last 24 hours: Temp:  [97.5 F (36.4 C)-99.1 F (37.3 C)] 98.2 F (36.8 C) (10/13 0431) Pulse Rate:  [83-103] 83 (10/13 0600) Resp:  [6-16] 11 (10/13 0600) BP: (105-127)/(66-96) 127/81 (10/13 0600) SpO2:  [94 %-100 %] 100 % (10/13 0600) Arterial Line BP: (93-149)/(67-85) 136/82 (10/13 0600) Weight:  [95.2 kg (209 lb 14.1 oz)-98 kg (216 lb 0.8 oz)] 95.2 kg (209 lb 14.1 oz) (10/13 0431)  Intake/Output from previous day: 10/12 0701 - 10/13 0700 In: 2711.7 [I.V.:2611.7; IV Piggyback:100] Out: 1490 [Urine:1100; Drains:190; Blood:200] Intake/Output this shift: No intake/output data recorded.  Physical Exam:  General: Alert and oriented CV: RRR Lungs: Clear Abdomen: Soft, appropriately tender. Incisions c/d/i. JP SS GU: Urostomy pink and productive of clear pink urine. Bilateral Bander stents in place.  Ext: NT, No erythema  Lab Results:  Recent Labs  09/27/17 1402 09/28/17 1310 09/29/17 0504  HGB 11.6* 11.1* 10.6*  HCT 35.5* 33.8* 32.1*   BMET  Recent Labs  09/27/17 1402 09/29/17 0504  NA 138 137  K 3.6 4.2  CL 102 103  CO2 27 26  GLUCOSE 153* 168*  BUN 28* 24*  CREATININE 1.23 1.13  CALCIUM 8.8* 8.5*     Studies/Results: No results found.  Assessment/Plan:  71 y.o. 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  Overall doing well post-op.   Muscle invasive bladder cancer: s/p RC/IC 09/28/17. No further treatment in house. Path pending. Transfer to floor today.   Post-op Ileus: s/p bowel anastomosis at the time of surgery. No flatus or BM. Continue clears until passes gas.   A-fib, rate controlled: Continue home metoprolol. Continue [facility] - restart  Eliquis 7d post-op.   Dispo/Rehab: Independent at baseline. PT eval pending. Appreciate assistance of WOCN. Encourage OOB/IS.    LOS: 2 days   Stasia Cavalier 09/29/2017, 7:13 AM

## 2017-09-30 LAB — BASIC METABOLIC PANEL
ANION GAP: 6 (ref 5–15)
BUN: 22 mg/dL — ABNORMAL HIGH (ref 6–20)
CALCIUM: 8.4 mg/dL — AB (ref 8.9–10.3)
CO2: 27 mmol/L (ref 22–32)
Chloride: 105 mmol/L (ref 101–111)
Creatinine, Ser: 1.07 mg/dL (ref 0.61–1.24)
GLUCOSE: 115 mg/dL — AB (ref 65–99)
POTASSIUM: 4 mmol/L (ref 3.5–5.1)
SODIUM: 138 mmol/L (ref 135–145)

## 2017-09-30 LAB — CBC
HEMATOCRIT: 29.8 % — AB (ref 39.0–52.0)
HEMOGLOBIN: 9.6 g/dL — AB (ref 13.0–17.0)
MCH: 31.1 pg (ref 26.0–34.0)
MCHC: 32.2 g/dL (ref 30.0–36.0)
MCV: 96.4 fL (ref 78.0–100.0)
Platelets: 182 10*3/uL (ref 150–400)
RBC: 3.09 MIL/uL — AB (ref 4.22–5.81)
RDW: 13.8 % (ref 11.5–15.5)
WBC: 7.9 10*3/uL (ref 4.0–10.5)

## 2017-09-30 MED ORDER — DOCUSATE SODIUM 100 MG PO CAPS
200.0000 mg | ORAL_CAPSULE | Freq: Two times a day (BID) | ORAL | Status: DC
  Administered 2017-09-30 – 2017-10-02 (×3): 100 mg via ORAL
  Administered 2017-10-03: 200 mg via ORAL
  Filled 2017-09-30 (×5): qty 2

## 2017-09-30 MED ORDER — SENNA 8.6 MG PO TABS
2.0000 | ORAL_TABLET | Freq: Every day | ORAL | Status: DC
  Filled 2017-09-30 (×2): qty 2

## 2017-09-30 MED ORDER — HYDROMORPHONE HCL 1 MG/ML IJ SOLN: 0.5000 mg | INTRAMUSCULAR | Status: DC | PRN

## 2017-09-30 MED ORDER — GI COCKTAIL ~~LOC~~: 30.0000 mL | Freq: Two times a day (BID) | ORAL | Status: DC | PRN

## 2017-09-30 NOTE — Progress Notes (Addendum)
Urology Progress Note   2 Days Post-Op  Subjective: NAEON. No flatus or BM. No n/v. Ambulating with assistance. Pain well controlled, harldy using PCA.   Objective: Vital signs in last 24 hours: Temp:  [97.6 F (36.4 C)-98.3 F (36.8 C)] 98.2 F (36.8 C) (10/14 0427) Pulse Rate:  [81-103] 81 (10/14 0427) Resp:  [10-19] 13 (10/14 0506) BP: (97-131)/(76-103) 109/77 (10/14 0427) SpO2:  [83 %-100 %] 98 % (10/14 0506) Arterial Line BP: (137)/(80) 137/80 (10/13 1000)  Intake/Output from previous day: 10/13 0701 - 10/14 0700 In: 2600 [I.V.:2600] Out: 2211 [Urine:1725; Drains:486] Intake/Output this shift: No intake/output data recorded.  Physical Exam:  General: Alert and oriented CV: RRR Lungs: Clear Abdomen: Soft, appropriately tender. Incisions c/d/i. JP SS GU: Urostomy pink and productive of clear pink urine. Bilateral Bander stents in place.  Ext: NT, No erythema  Lab Results:  Recent Labs  09/28/17 1310 09/29/17 0504 09/30/17 0451  HGB 11.1* 10.6* 9.6*  HCT 33.8* 32.1* 29.8*   BMET  Recent Labs  09/29/17 0504 09/30/17 0451  NA 137 138  K 4.2 4.0  CL 103 105  CO2 26 27  GLUCOSE 168* 115*  BUN 24* 22*  CREATININE 1.13 1.07  CALCIUM 8.5* 8.4*     Studies/Results: No results found.  Assessment/Plan:  71 y.o. 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  Overall doing well post-op.   Muscle invasive bladder cancer: s/p RC/IC 09/28/17. No further treatment in house. Path pending.   Post-op Ileus: s/p bowel anastomosis at the time of surgery. No flatus or BM. Continue clears until passes gas.   A-fib, rate controlled: Continue home metoprolol. Continue [facility] - restart Eliquis 7d post-op.   Dispo/Rehab: Independent at baseline. PT eval pending. Appreciate assistance of WOCN.  Encourage OOB/IS.    LOS: 3 days   Stasia Cavalier 09/30/2017, 8:14 AM

## 2017-10-01 LAB — BASIC METABOLIC PANEL
Anion gap: 8 (ref 5–15)
BUN: 15 mg/dL (ref 6–20)
CO2: 26 mmol/L (ref 22–32)
CREATININE: 0.99 mg/dL (ref 0.61–1.24)
Calcium: 8.3 mg/dL — ABNORMAL LOW (ref 8.9–10.3)
Chloride: 102 mmol/L (ref 101–111)
GFR calc Af Amer: >60 mL/min (ref >60)
GFR calc non Af Amer: >60 mL/min (ref >60)
GLUCOSE: 123 mg/dL — AB (ref 65–99)
Potassium: 3.9 mmol/L (ref 3.5–5.1)
Sodium: 136 mmol/L (ref 135–145)

## 2017-10-01 LAB — CBC
HEMATOCRIT: 29.8 % — AB (ref 39.0–52.0)
Hemoglobin: 10 g/dL — ABNORMAL LOW (ref 13.0–17.0)
MCH: 32.2 pg (ref 26.0–34.0)
MCHC: 33.6 g/dL (ref 30.0–36.0)
MCV: 95.8 fL (ref 78.0–100.0)
Platelets: 182 10*3/uL (ref 150–400)
RBC: 3.11 MIL/uL — ABNORMAL LOW (ref 4.22–5.81)
RDW: 13.6 % (ref 11.5–15.5)
WBC: 5.9 10*3/uL (ref 4.0–10.5)

## 2017-10-01 LAB — CREATININE, FLUID (PLEURAL, PERITONEAL, JP DRAINAGE): Creat, Fluid: 1.1 mg/dL

## 2017-10-01 MED ORDER — OXYCODONE HCL 5 MG PO TABS: 5.0000 mg | ORAL_TABLET | ORAL | 0 refills | Status: DC | PRN

## 2017-10-01 MED ORDER — SENNA 8.6 MG PO TABS: 2.0000 | ORAL_TABLET | Freq: Every day | ORAL | 11 refills | Status: DC

## 2017-10-01 NOTE — Progress Notes (Cosign Needed)
Urology Progress Note   3 Days Post-Op  Subjective: NAEON. Passing gas and having BMs. Tolerating clears. No n/v. Ambulating without assistance. Pain well controlled with PO pain medication.   Objective: Vital signs in last 24 hours: Temp:  [97.4 F (36.3 C)-99.3 F (37.4 C)] 98.4 F (36.9 C) (10/15 0525) Pulse Rate:  [88-114] 88 (10/15 0525) Resp:  [14-20] 18 (10/15 0525) BP: (106-134)/(75-99) 132/99 (10/15 0525) SpO2:  [94 %-99 %] 97 % (10/15 0525)  Intake/Output from previous day: 10/14 0701 - 10/15 0700 In: 240 [P.O.:240] Out: 3954 [Urine:3300; Drains:654] Intake/Output this shift: No intake/output data recorded.  Physical Exam:  General: Alert and oriented CV: RRR Lungs: Clear Abdomen: Soft, appropriately tender. Incisions c/d/i. JP SS GU: Urostomy pink and productive of clear pink urine. Bilateral Bander stents in place.  Ext: NT, No erythema  Lab Results:  Recent Labs  09/29/17 0504 09/30/17 0451 10/01/17 0515  HGB 10.6* 9.6* 10.0*  HCT 32.1* 29.8* 29.8*   BMET  Recent Labs  09/30/17 0451 10/01/17 0515  NA 138 136  K 4.0 3.9  CL 105 102  CO2 27 26  GLUCOSE 115* 123*  BUN 22* 15  CREATININE 1.07 0.99  CALCIUM 8.4* 8.3*     Studies/Results: No results found.  Assessment/Plan:  71 y.o. 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  Overall doing well post-op.   Muscle invasive bladder cancer: s/p RC/IC 09/28/17. No further treatment in house. Path pending.   Post-op Ileus: s/p bowel anastomosis at the time of surgery. Passing gas and having BMs. Will advance to regular diet today.   A-fib, rate controlled: Continue home metoprolol. Continue [facility] - restart Eliquis 7d post-op.   Dispo/Rehab: Independent at baseline. PT eval pending. Appreciate assistance of WOCN.  Encourage OOB/IS.    LOS: 4 days   Stasia Cavalier 10/01/2017, 7:04 AM

## 2017-10-01 NOTE — Consult Note (Signed)
[place] Nurse ostomy consult note Stoma type/location: RLQ with 2 stents in place. Red = right, blue = left Stomal assessment/size: 1 and 1/4 inch round, red, moist, edematous. Stoma is wider at dome than at base. Peristomal assessment: Intact in the immediate peristomal field, two areas of medical adhesive related skin injury (MARSI) Treatment options for stomal/peristomal skin: Hydrocolloid placed over two areas of MARSI at 1 o'clock and from 8-10 o'clock. Stoma powder used to lift hair regrowth in a dry powder shave technique/procedure. Skin barrier ring placed to accommodate depressions at 11 o'clock and at 3 o'clock. Output: Clear yellow urine Ostomy pouching: 1pc.convex ostomy pouch with skin barrier ring Education provided: Extended session with patient and wife to include GI/GU A&P, stoma and pouch characteristics, mucus, stents, pouch preparation, removal, stoma sizing, and pouch application. Teaching pertaining to care of bedside urinary drainage system, pouch change frequency and emptying frequency. Patient's wife traced and cut convex pouch for the next pouch change. Plan to continue teaching with another session tomorrow. Enrolled patient in [place] program: No Recommend [facility] services upon discharge.  Suggest discharge no earlier than Wednesday to address patient's and wife's numerous remaining questions and also to observe for tolerance of regular diet. (Unable to tolerate today).  [place] nursing team will follow, and will remain available to this patient, the nursing and medical teams. Thanks, Maudie Flakes, MSN, RN, [place], Arther Abbott  Pager# [phone]

## 2017-10-02 LAB — BASIC METABOLIC PANEL
Anion gap: 8 (ref 5–15)
BUN: 15 mg/dL (ref 6–20)
CHLORIDE: 102 mmol/L (ref 101–111)
CO2: 27 mmol/L (ref 22–32)
CREATININE: 1.05 mg/dL (ref 0.61–1.24)
Calcium: 8.7 mg/dL — ABNORMAL LOW (ref 8.9–10.3)
GFR calc Af Amer: >60 mL/min (ref >60)
GFR calc non Af Amer: >60 mL/min (ref >60)
Glucose, Bld: 128 mg/dL — ABNORMAL HIGH (ref 65–99)
Potassium: 4 mmol/L (ref 3.5–5.1)
Sodium: 137 mmol/L (ref 135–145)

## 2017-10-02 LAB — CBC
HCT: 32.2 % — ABNORMAL LOW (ref 39.0–52.0)
Hemoglobin: 10.6 g/dL — ABNORMAL LOW (ref 13.0–17.0)
MCH: 31.2 pg (ref 26.0–34.0)
MCHC: 32.9 g/dL (ref 30.0–36.0)
MCV: 94.7 fL (ref 78.0–100.0)
PLATELETS: 210 10*3/uL (ref 150–400)
RBC: 3.4 MIL/uL — ABNORMAL LOW (ref 4.22–5.81)
RDW: 13.6 % (ref 11.5–15.5)
WBC: 4.7 10*3/uL (ref 4.0–10.5)

## 2017-10-02 NOTE — Consult Note (Signed)
[place] Nurse ostomy follow up Stoma type/location: RLQ urostomy with 2 stents intact. Stomal assessment/size: Not measured today Peristomal assessment: not seen today Treatment options for stomal/peristomal skin: Skin barrier ring in place and two hydrocolloid dressings used to cover areas of MARSI are intact. Output: clearing yellow urine Ostomy pouching: 1pc. Convex pouching system with skin barrier ring is intact  Education provided: Extended session; patient and wife with several questions that they have written down.  All are answered to their satisfaction. Demonstration provided of support tool [employer].   Leg strap provided and demonstrated.  Patient is disconnecting and reconnecting from bedside drainage bag today.  Light meal consumed and held down without nausea or emesis today (cottage cheese and fruit). Will see again tomorrow. Enrolled patient in [place] Discharge program: Yes, today.  [place] nursing team will follow, and will remain available to this patient, the nursing and medical teams.  Thanks, Maudie Flakes, MSN, RN, [place], Arther Abbott  Pager# [phone]

## 2017-10-02 NOTE — Progress Notes (Signed)
4 Days Post-Op   Subjective/Chief Complaint:  1 - Bladder Cancer - s/p robotic cystoprostatectomy with ICG sentinal + template lymphadenectomy on 09/28/17. Observed stepdown POD 0 then transferred to med surg floor. Path pending. JP Cr same as serum 10/15.   2 - Post-Op Ileus - s/p bowel anastamosis at surgery above. NPO initially, then advanced to regular diet by 10/15 as resumed BM's / flatus  3 - Disposition / Rehab - independent at baseline. Ostomy RN following in house for new ostomy teaching. He is ambulatory w/o walker post-op.  Today "Randy Wheeler" is stable. One episode scant emesis yesterday, but otherwise tollerating PO, pain controlled, and abmulatory.    Objective: Vital signs in last 24 hours: Temp:  [98.1 F (36.7 C)-98.5 F (36.9 C)] 98.5 F (36.9 C) (10/16 0528) Pulse Rate:  [54-103] 92 (10/16 0528) Resp:  [16-18] 16 (10/16 0528) BP: (105-122)/(68-92) 121/92 (10/16 0528) SpO2:  [96 %-98 %] 97 % (10/16 0528) Last BM Date: 09/30/17  Intake/Output from previous day: 10/15 0701 - 10/16 0700 In: 840 [P.O.:840] Out: 2435 [Urine:1615; Emesis/NG output:100; Drains:720] Intake/Output this shift: Total I/O In: -  Out: 60 [Drains:60]  EXAM:  General: Alert and oriented. In chair.  CV: RRR Lungs: Clear on room air.  Abdomen: Soft, appropriately tender. Incisions c/d/i. JP SS GU: Urostomy pink and productive of clear pink urine. Bilateral Bander stents in place.  Ext: NT, No erythema  Lab Results:   Recent Labs  10/01/17 0515 10/02/17 0454  WBC 5.9 4.7  HGB 10.0* 10.6*  HCT 29.8* 32.2*  PLT 182 210   BMET  Recent Labs  10/01/17 0515 10/02/17 0454  NA 136 137  K 3.9 4.0  CL 102 102  CO2 26 27  GLUCOSE 123* 128*  BUN 15 15  CREATININE 0.99 1.05  CALCIUM 8.3* 8.7*   PT/INR No results for input(s): LABPROT, INR in the last 72 hours. ABG No results for input(s): PHART, HCO3 in the last 72 hours.  Invalid input(s): PCO2, PO2  Studies/Results: No  results found.  Anti-infectives: Anti-infectives    Start     Dose/Rate Route Frequency Ordered Stop   09/28/17 1600  metroNIDAZOLE (FLAGYL) tablet 500 mg     500 mg Oral Every 8 hours 09/28/17 1444 09/29/17 0811   09/28/17 1600  neomycin (MYCIFRADIN) tablet 1,000 mg     1,000 mg Oral Every 8 hours 09/28/17 1444 09/29/17 1559   09/28/17 0633  piperacillin-tazobactam (ZOSYN) 3.375 GM/50ML IVPB  Status:  Discontinued    Comments:  Randy Wheeler, Randy Wheeler   : cabinet override      09/28/17 0633 09/28/17 0645   09/28/17 0600  piperacillin-tazobactam (ZOSYN) IVPB 3.375 g     3.375 g 100 mL/hr over 30 Minutes Intravenous 30 min pre-op 09/27/17 1220 09/28/17 0825      Assessment/Plan:   1 - Bladder Cancer - doing well. No further cancer directed care in house. Path pending.   2 - Post-Op Ileus - cotninue reg diet and ambulation, this is regressing nicely.   3 - Disposition / Rehab - will need HHRN for new ostomy at discharge, but no further needs as of now. Remain in house.   [facility], Alba Perillo 10/02/2017

## 2017-10-02 NOTE — Consult Note (Signed)
[place] Nurse ostomy follow up  Enrolled patient in [employer] Discharge program: Yes, today. Requested 4 soft convex urostomy pouches, 4 skin barrier rings and a large belt. Delivery is expected on or before Friday of this week.  [place] nursing team will follow, and will remain available to this patient, the nursing and medical teams.   Thanks, Maudie Flakes, MSN, RN, [place], Arther Abbott  Pager# [phone]

## 2017-10-02 NOTE — Care Management Important Message (Signed)
Important Message  Patient Details  Name: SHAQUEL CHAVOUS MRN: [id number] Date of Birth: [birth year]   Medicare Important Message Given:  Yes    Kerin Salen 10/02/2017, 11:18 AMImportant Message  Patient Details  Name: SHAUNN TACKITT MRN: [id number] Date of Birth: 20-May-1946   Medicare Important Message Given:  Yes    Kerin Salen 10/02/2017, 11:18 AM

## 2017-10-03 LAB — BASIC METABOLIC PANEL
ANION GAP: 10 (ref 5–15)
BUN: 18 mg/dL (ref 6–20)
CHLORIDE: 99 mmol/L — AB (ref 101–111)
CO2: 28 mmol/L (ref 22–32)
CREATININE: 1.19 mg/dL (ref 0.61–1.24)
Calcium: 8.5 mg/dL — ABNORMAL LOW (ref 8.9–10.3)
GFR calc non Af Amer: >60 mL/min (ref >60)
Glucose, Bld: 124 mg/dL — ABNORMAL HIGH (ref 65–99)
POTASSIUM: 3.4 mmol/L — AB (ref 3.5–5.1)
SODIUM: 137 mmol/L (ref 135–145)

## 2017-10-03 LAB — CBC
HCT: 31.8 % — ABNORMAL LOW (ref 39.0–52.0)
HEMOGLOBIN: 10.5 g/dL — AB (ref 13.0–17.0)
MCH: 30.9 pg (ref 26.0–34.0)
MCHC: 33 g/dL (ref 30.0–36.0)
MCV: 93.5 fL (ref 78.0–100.0)
PLATELETS: 227 10*3/uL (ref 150–400)
RBC: 3.4 MIL/uL — AB (ref 4.22–5.81)
RDW: 13.8 % (ref 11.5–15.5)
WBC: 5.2 10*3/uL (ref 4.0–10.5)

## 2017-10-03 NOTE — Progress Notes (Signed)
Discharged to home, instructions given, acknowldege understanding. SRP, RN

## 2017-10-03 NOTE — Discharge Instructions (Addendum)
1.  Activity:  You are encouraged to ambulate frequently (about every hour during waking hours) to help prevent blood clots from forming in your legs or lungs.  However, you should not engage in any heavy lifting (> 10-15 lbs), strenuous activity, or straining. 2. Diet: You should advance your diet as instructed by your physician.  It will be normal to have some bloating, nausea, and abdominal discomfort intermittently. 3. Prescriptions:  RESUME YOUR ELIQUIS AFTER DR [facility] SEES YOU IN CLINIC. You will be provided a prescription for pain medication to take as needed.  If your pain is not severe enough to require the prescription pain medication, you may take extra strength Tylenol instead which will have less side effects.  You should also take a prescribed stool softener to avoid straining with bowel movements as the prescription pain medication may constipate you. 4. Incisions: You may remove your dressing bandages 48 hours after surgery if not removed in the hospital.  You will either have some small staples or special tissue glue at each of the incision sites. Once the bandages are removed (if present), the incisions may stay open to air.  You may start showering (but not soaking or bathing in water) the 2nd day after surgery and the incisions simply need to be patted dry after the shower.  No additional care is needed. 5. What to call us about: You should call the office [phone]) if you develop fever > 101 or develop persistent vomiting.  You may resume Eliquis, aspirin, advil, aleve, vitamins, and supplements 7 days after surgery.

## 2017-10-03 NOTE — Discharge Summary (Signed)
[facility] Discharge Summary  Admit date: 09/27/2017  Discharge date and time: 10/03/17   Discharge to: Home  Discharge Service: Urology  Discharge Attending Physician:  [name]  Discharge  Diagnoses: Bladder cancer  Secondary Diagnosis: Active Problems:   Bladder cancer ([place])   S/P ileal conduit ([place])   OR Procedures: Procedure(s): ROBOT ASSISTED LAPAROSCOPIC RADICAL CYSTOPROSTATECTOMY BILATERAL PELVIC LYMPHADENECTOMY,ILEAL CONDUIT DIVERSION CYSTOSCOPY WITH INJECTION OF INDOCYANINE GREEN DYE 09/27/2017 - 09/28/2017   Ancillary Procedures: None   Discharge Day Services: The patient was seen and examined by the Urology team both in the morning and immediately prior to discharge.  Vital signs and laboratory values were stable and within normal limits.  The physical exam was benign and unchanged and all surgical wounds were examined.  Discharge instructions were explained and all questions answered.  Subjective  No acute events overnight. Pain Controlled. No fever or chills.  Objective Patient Vitals for the past 8 hrs:  BP Temp Temp src Pulse Resp SpO2  10/03/17 0539 114/83 98.3 F (36.8 C) Oral 88 18 96 %   Total I/O In: -  Out: 130 [Drains:130]  General Appearance:        No acute distress Lungs:                       Normal work of breathing on room air Heart:                                Regular rate and rhythm Abdomen:                         Soft, non-tender, non-distended. Incisions c/d/i. Former JP site dressed with gauze. Urostomy pink and productive of clear yellow urine.  Extremities:                      Warm and well perfused   Hospital Course:  The patient underwent  robotic cystoprostatectomy with ICG sentinal + template lymphadenectomy on 09/28/17.  The patient tolerated the procedure well, was extubated in the OR, and afterwards was taken to the PACU for routine post-surgical care. When stable the patient was transferred to the stepdown unit.  .   The patient did well postoperatively and was transferred to the floor on POD1.  The patient's diet was slowly advanced and at the time of discharge was tolerating a regular diet.  The patient was discharged home 5 Days Post-Op, at which point was tolerating a regular solid diet, was able to ably manage his urostomy, have adequate pain control with P.O. pain medication, and could ambulate without difficulty. The patient will follow up with [place] for post op check. His stents will be removed in the coming weeks in clinic. Final pathology [id number].   Condition at Discharge: Improved  Discharge Medications:  Allergies as of 10/03/2017   No Known Allergies     Medication List    TAKE these medications   docusate sodium 100 MG capsule Commonly known as:  COLACE Take 100 mg by mouth daily.   ELIQUIS 5 MG Tabs tablet Generic drug:  apixaban Take 5 mg by mouth 2 (two) times daily.   famotidine 10 MG chewable tablet Commonly known as:  PEPCID AC Chew 10 mg by mouth daily as needed for heartburn.   fluticasone 50 MCG/ACT nasal spray Commonly known as:  FLONASE Place 1  spray into both nostrils at bedtime as needed for allergies or rhinitis.   hydrochlorothiazide 25 MG tablet Commonly known as:  HYDRODIURIL Take 25 mg by mouth daily.   metoprolol succinate 50 MG 24 hr tablet Commonly known as:  TOPROL-XL Take 50 mg by mouth every evening. Take with or immediately following a meal.   omeprazole 20 MG capsule Commonly known as:  PRILOSEC Take 20 mg by mouth every morning.   oxyCODONE 5 MG immediate release tablet Commonly known as:  Oxy IR/ROXICODONE Take 1 tablet (5 mg total) by mouth every 4 (four) hours as needed for moderate pain.   senna 8.6 MG Tabs tablet Commonly known as:  SENOKOT Take 2 tablets (17.2 mg total) by mouth at bedtime.

## 2017-10-03 NOTE — Progress Notes (Signed)
Spoke with pt concerning HH. Pt selected [place] for [facility] needs.  Referral given to in house rep.

## 2017-10-03 NOTE — Progress Notes (Signed)
Pt showered , tol well [place] conduit and JP remain intact old drainage noted site. JP to be removed by MD. Pt up ambulated in hall several times tol POs. Will cont to monitor. SRP,RN

## 2017-10-03 NOTE — Consult Note (Signed)
[place] Nurse ostomy follow up Stoma type/location: RLQ urostomy with 2 stents intact Stomal assessment/size: 1 and 1/8 inch, cut off-center Peristomal assessment: Intact, clear in the immediate peristomal area, areas of MARSI are improving with hydrocolloid treatment. Treatment options for stomal/peristomal skin: Two pieces of hydrocolloid to cover MARSI.  Skin barrier ring to assist with seal around sutures and to fill in depression noted at 10 o'clock. Output: clear yellow urine Ostomy pouching: 1pc.convex with skin barrier ring and two pieces of hydrocolloid over areas of MARSI  Education provided: Patient had written out questions for me and I answered those to his satisfaction.  A belt is provided today and I demonstrated its use. Enrolled patient in [place] Start Discharge program: Yes [place] nursing team will follow, and will remain available to this patient, the nursing and medical teams.  Thanks, Maudie Flakes, MSN, RN, [place], Arther Abbott  Pager# [phone]

## 2017-11-15 ENCOUNTER — Encounter: Payer: Self-pay | Admitting: Oncology

## 2017-11-15 ENCOUNTER — Inpatient Hospital Stay: Payer: Medicare Other | Attending: Oncology | Admitting: Oncology

## 2017-11-15 ENCOUNTER — Inpatient Hospital Stay: Payer: Medicare Other

## 2017-11-15 VITALS — BP 131/85 | HR 98 | Temp 98.4°F | Resp 16 | Wt 201.0 lb

## 2017-11-15 DIAGNOSIS — N281 Cyst of kidney, acquired: Secondary | ICD-10-CM | POA: Diagnosis not present

## 2017-11-15 DIAGNOSIS — K219 Gastro-esophageal reflux disease without esophagitis: Secondary | ICD-10-CM | POA: Diagnosis not present

## 2017-11-15 DIAGNOSIS — I10 Essential (primary) hypertension: Secondary | ICD-10-CM | POA: Diagnosis not present

## 2017-11-15 DIAGNOSIS — K862 Cyst of pancreas: Secondary | ICD-10-CM | POA: Insufficient documentation

## 2017-11-15 DIAGNOSIS — K7689 Other specified diseases of liver: Secondary | ICD-10-CM | POA: Diagnosis not present

## 2017-11-15 DIAGNOSIS — E785 Hyperlipidemia, unspecified: Secondary | ICD-10-CM | POA: Diagnosis not present

## 2017-11-15 DIAGNOSIS — T451X5S Adverse effect of antineoplastic and immunosuppressive drugs, sequela: Secondary | ICD-10-CM | POA: Diagnosis not present

## 2017-11-15 DIAGNOSIS — I480 Paroxysmal atrial fibrillation: Secondary | ICD-10-CM

## 2017-11-15 DIAGNOSIS — Z281 Immunization not carried out because of patient decision for reasons of belief or group pressure: Secondary | ICD-10-CM | POA: Diagnosis not present

## 2017-11-15 DIAGNOSIS — Z87891 Personal history of nicotine dependence: Secondary | ICD-10-CM | POA: Insufficient documentation

## 2017-11-15 DIAGNOSIS — T451X5A Adverse effect of antineoplastic and immunosuppressive drugs, initial encounter: Principal | ICD-10-CM

## 2017-11-15 DIAGNOSIS — Z9221 Personal history of antineoplastic chemotherapy: Secondary | ICD-10-CM | POA: Diagnosis not present

## 2017-11-15 DIAGNOSIS — C674 Malignant neoplasm of posterior wall of bladder: Secondary | ICD-10-CM | POA: Insufficient documentation

## 2017-11-15 DIAGNOSIS — R7303 Prediabetes: Secondary | ICD-10-CM | POA: Insufficient documentation

## 2017-11-15 DIAGNOSIS — Z79899 Other long term (current) drug therapy: Secondary | ICD-10-CM

## 2017-11-15 DIAGNOSIS — D6481 Anemia due to antineoplastic chemotherapy: Secondary | ICD-10-CM | POA: Diagnosis not present

## 2017-11-15 DIAGNOSIS — D649 Anemia, unspecified: Secondary | ICD-10-CM

## 2017-11-15 DIAGNOSIS — Z7901 Long term (current) use of anticoagulants: Secondary | ICD-10-CM | POA: Insufficient documentation

## 2017-11-15 LAB — CBC WITH DIFFERENTIAL/PLATELET
BASOS PCT: 1 %
Basophils Absolute: 0 10*3/uL (ref 0–0.1)
Eosinophils Absolute: 0.1 10*3/uL (ref 0–0.7)
Eosinophils Relative: 2 %
HEMATOCRIT: 36.3 % — AB (ref 40.0–52.0)
HEMOGLOBIN: 12 g/dL — AB (ref 13.0–18.0)
LYMPHS PCT: 34 %
Lymphs Abs: 1.7 10*3/uL (ref 1.0–3.6)
MCH: 29.4 pg (ref 26.0–34.0)
MCHC: 33 g/dL (ref 32.0–36.0)
MCV: 89.2 fL (ref 80.0–100.0)
MONO ABS: 0.5 10*3/uL (ref 0.2–1.0)
MONOS PCT: 11 %
NEUTROS ABS: 2.6 10*3/uL (ref 1.4–6.5)
NEUTROS PCT: 52 %
Platelets: 220 10*3/uL (ref 150–440)
RBC: 4.07 MIL/uL — ABNORMAL LOW (ref 4.40–5.90)
RDW: 14.7 % — ABNORMAL HIGH (ref 11.5–14.5)
WBC: 5 10*3/uL (ref 3.8–10.6)

## 2017-11-15 LAB — COMPREHENSIVE METABOLIC PANEL
ALT: 31 U/L (ref 17–63)
ANION GAP: 10 (ref 5–15)
AST: 32 U/L (ref 15–41)
Albumin: 4 g/dL (ref 3.5–5.0)
Alkaline Phosphatase: 36 U/L — ABNORMAL LOW (ref 38–126)
BUN: 26 mg/dL — ABNORMAL HIGH (ref 6–20)
CALCIUM: 9.1 mg/dL (ref 8.9–10.3)
CHLORIDE: 97 mmol/L — AB (ref 101–111)
CO2: 27 mmol/L (ref 22–32)
Creatinine, Ser: 1.11 mg/dL (ref 0.61–1.24)
GFR calc non Af Amer: >60 mL/min (ref >60)
GLUCOSE: 189 mg/dL — AB (ref 65–99)
POTASSIUM: 3.6 mmol/L (ref 3.5–5.1)
SODIUM: 134 mmol/L — AB (ref 135–145)
Total Bilirubin: 0.7 mg/dL (ref 0.3–1.2)
Total Protein: 7.3 g/dL (ref 6.5–8.1)

## 2017-11-15 LAB — VITAMIN B12: Vitamin B-12: 479 pg/mL (ref 180–914)

## 2017-11-15 LAB — IRON AND TIBC
Iron: 67 ug/dL (ref 45–182)
Saturation Ratios: 18 % (ref 17.9–39.5)
TIBC: 372 ug/dL (ref 250–450)
UIBC: 305 ug/dL

## 2017-11-15 LAB — FERRITIN: Ferritin: 81 ng/mL (ref 24–336)

## 2017-11-15 LAB — FOLATE: Folate: 13.4 ng/mL (ref >5.9)

## 2017-11-15 MED ORDER — SODIUM CHLORIDE 0.9% FLUSH
10.0000 mL | INTRAVENOUS | Status: DC | PRN
  Administered 2017-11-15: 10 mL via INTRAVENOUS
  Filled 2017-11-15: qty 10

## 2017-11-15 MED ORDER — HEPARIN SOD (PORK) LOCK FLUSH 100 UNIT/ML IV SOLN
500.0000 [IU] | Freq: Once | INTRAVENOUS | Status: AC
  Administered 2017-11-15: 500 [IU] via INTRAVENOUS

## 2017-11-15 NOTE — Progress Notes (Signed)
Patient here for follow up with labs today. He states that he is feeling well and denies having any pain. He only has one stent left and is planning to have that removed next month. He got a good report from the surgeon this week.

## 2017-11-15 NOTE — Progress Notes (Signed)
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  [id number]   Chief complaint/ Reason for visit-discuss pathology results and adjuvant management  Heme/Onc history: 1. Patient is a 71 year old male with a past medical history significant for atrial fibrillation for which he was on Eliquis in the past. In August 2017 patient had minor trauma to his abdomen while bone pain and then left hematuria or immediately following that which lasted for about a couple of days. Patient thought that this was likely secondary to trauma . However continued to have intermittent hematuria since then and then had a second major episode of bleeding in March 2018. He spoke to his primary care doctor who suggested to stop Eliquis at this time. However bleeding continued for 2-3 days after stopping Eliquis and hence he was referred to urology [name]  2. Ct abdomen on 03/09/17 showed: IMPRESSION: 1. 5.6 cm polypoid mass arises from the posterior right bladder wall, consistent with urothelial neoplasm. 2. 2 cm exophytic hypoattenuating lesion identified along the uncinate process of the pancreas. Dedicated abdominal MRI without and with contrast may prove helpful to further evaluate.  3. MRI abdomen on 03/30/17 showed: . 1.8 cm simple appearing cystic lesion exophytic from the uncinate process of the pancreas is assisted with other scattered tiny pancreatic parenchymal cyst. Repeat MRI in 12 months is recommended. This recommendation follows ACR consensus guidelines: Management of Incidental Pancreatic Cysts: A White Paper of the ACR  Incidental Findings Committee. J Am Coll Radiol 5427;06:237-628. 2. 1 cm enhancing focus identified in the anterior aspect of the T12 vertebral body. Imaging features are not suggestive of cavernous hemangioma. Given the history of bladder cancer, metastatic involvement cannot be excluded. Bone scan may prove helpful to further evaluate.  4. Bone scan on 04/10/17 showed: IMPRESSION: 1. No abnormality is seen in the region of T12 to suggest a bone metastasis when compared to the prior MRI images. 2. Negative total body bone scan other than probable degenerative change of the facet joints of the lower lumbar spine.  5. CXr was negative for metastatic disease. Patient underwent TURBT on 04/02/17 which showed: DIAGNOSIS:  A. BLADDER TUMOR; TURBT:  - PAPILLARY UROTHELIAL CARCINOMA, INVADING MUSCULARIS PROPRIA,  HIGH-GRADE (WHO/ISUP).  6. Case was discussed at Tumor board and patient was thought to have atleast T2 cN0 muscle invasive bladder cancer. He has started neoadjuvant gem/cis chemotherapy. Since his counts did not hold up for D15 gemzar, plan is to give 2 weeks on 1 week of gem and split dose cisplatin. He has received 4 cycles of gem/cis 2 weeks on and 1 week off so far wich he has tolerated very well without significant side effects  7. Scans after 4 cycles of hemotherapy showed: 1. Large intraluminal bladder lesions seen on the prior CT scan is no longer evident. No findings to suggest metastatic disease in the abdomen or pelvis on today's exam. 2. Stable hepatic cysts and other smaller liver lesions, too small to characterize, but likely cysts. 3. No change 1.8 cm cystic lesion in the uncinate process of the pancreas. Continued attention on follow-up imaging recommended. 4. Stable 4 cm simple cyst upper pole left kidney. 5.  Stable appearance of the focal sclerotic lesion T12 vertebral body. 6. Aortic Atherosclerois (ICD10-170.0)  8. Patient completed 5 cycles of gem/cis  chemotherapy   9.  Patient underwent laparoscopic radical cystoprostatectomy with bilateral pelvic lymphadenectomy and I ileal conduit diversion with cystoscopy on 09/28/2017.  Pathology showed yp T1 a N0 M0.  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  10 lymph nodes were examined and were negative for malignancy.  Interval history-patient has recovered remarkably well since his surgery.  He reports that his energy levels are better.  He denies any complaints today  ECOG PS- 0 Pain scale- 0   Review of systems- Review of Systems  Constitutional: Negative for chills, fever, malaise/fatigue and weight loss.  HENT: Negative for congestion, ear discharge and nosebleeds.   Eyes: Negative for blurred vision.  Respiratory: Negative for cough, hemoptysis, sputum production, shortness of breath and wheezing.   Cardiovascular: Negative for chest pain, palpitations, orthopnea and claudication.  Gastrointestinal: Negative for abdominal pain, blood in stool, constipation, diarrhea, heartburn, melena, nausea and vomiting.  Genitourinary: Negative for dysuria, flank pain, frequency, hematuria and urgency.  Musculoskeletal: Negative for back pain, joint pain and myalgias.  Skin: Negative for rash.  Neurological: Negative for dizziness, tingling, focal weakness, seizures, weakness and headaches.  Endo/Heme/Allergies: Does not bruise/bleed easily.  Psychiatric/Behavioral: Negative for depression and suicidal ideas. The patient does not have insomnia.     No Known Allergies   Past Medical History:  Diagnosis Date  . Cancer [facility])    Bladder  . Dysrhythmia    Atrial fib  . GERD (gastroesophageal reflux disease)   . Hyperlipidemia   . Hypertension   . Paroxysmal atrial fibrillation (HCC)   . Pre-diabetes    diet controlled  . Sexual dysfunction      Past Surgical History:  Procedure Laterality Date  . APPENDECTOMY    . COLONOSCOPY WITH PROPOFOL N/A  10/25/2015   Procedure: COLONOSCOPY WITH PROPOFOL;  Surgeon: Manya Silvas, MD;  Location: [facility] ENDOSCOPY;  Service: Endoscopy;  Laterality: N/A;  . CYSTOSCOPY WITH INJECTION N/A 09/28/2017   Procedure: CYSTOSCOPY WITH INJECTION OF INDOCYANINE GREEN DYE;  Surgeon: Alexis Frock, MD;  Location: [facility] ORS;  Service: Urology;  Laterality: N/A;  . IR FLUORO GUIDE PORT INSERTION RIGHT  04/27/2017  . TRANSURETHRAL RESECTION OF BLADDER TUMOR N/A 04/02/2017   Procedure: TRANSURETHRAL RESECTION OF BLADDER TUMOR (TURBT);  Surgeon: Hollice Espy, MD;  Location: [facility] ORS;  Service: Urology;  Laterality: N/A;    Social History   Socioeconomic History  . Marital status: Married    Spouse name: Not on file  . Number of children: Not on file  . Years of education: Not on file  . Highest education level: Not on file  Social Needs  . Financial resource strain: Not on file  . Food insecurity - worry: Not on file  . Food insecurity - inability: Not on file  . Transportation needs - medical: Not on file  . Transportation needs - non-medical: Not on file  Occupational History  . Not on file  Tobacco Use  . Smoking status: Former Smoker    Packs/day: 1.00    Types: Cigarettes    Last attempt to quit: 10/21/1997    Years since quitting: 20.0  . Smokeless tobacco: Never Used  Substance and Sexual Activity  . Alcohol use: Yes    Comment: 1-2 glasses of wine daily  . Drug use: No  . Sexual activity: Not on file  Other Topics Concern  . Not on file  Social History Narrative  . Not on file    Family History  Problem Relation Age of Onset  . Prostate cancer Father   . Stroke Father   . Atrial fibrillation Father   . Diabetes type II Father   . Kidney cancer Neg Hx      Current Outpatient Medications:  .  acetaminophen (TYLENOL) 325 MG tablet, Take 650 mg by mouth every 6 (six) hours as needed., Disp: , Rfl:  .  apixaban (ELIQUIS) 5 MG TABS tablet, Take 5 mg by mouth 2 (two) times daily.,  Disp: , Rfl:  .  docusate sodium (COLACE) 100 MG capsule, Take 100 mg by mouth daily., Disp: , Rfl:  .  famotidine (PEPCID AC) 10 MG chewable tablet, Chew 10 mg by mouth daily as needed for heartburn., Disp: , Rfl:  .  fluticasone (FLONASE) 50 MCG/ACT nasal spray, Place 1 spray into both nostrils at bedtime as needed for allergies or rhinitis., Disp: , Rfl:  .  hydrochlorothiazide (HYDRODIURIL) 25 MG tablet, Take 25 mg by mouth daily., Disp: , Rfl:  .  metoprolol succinate (TOPROL-XL) 50 MG 24 hr tablet, Take 50 mg by mouth every evening. Take with or immediately following a meal. , Disp: , Rfl:  .  omeprazole (PRILOSEC) 20 MG capsule, Take 20 mg by mouth every morning. , Disp: , Rfl:  .  senna (SENOKOT) 8.6 MG TABS tablet, Take 2 tablets (17.2 mg total) by mouth at bedtime., Disp: 60 each, Rfl: 11  Physical exam:  Vitals:   11/15/17 1114  BP: 131/85  Pulse: 98  Resp: 16  Temp: 98.4 F (36.9 C)  TempSrc: Tympanic  Weight: 201 lb (91.2 kg)   Physical Exam  Constitutional: He is oriented to person, place, and time and well-developed, well-nourished, and in no distress.  HENT:  Head: Normocephalic and atraumatic.  Eyes: EOM are normal. Pupils are equal, round, and reactive to light.  Neck: Normal range of motion.  Cardiovascular: Normal rate, regular rhythm and normal heart sounds.  Pulmonary/Chest: Effort normal and breath sounds normal.  Abdominal: Soft. Bowel sounds are normal.  Pouch from the ileal conduit seen  Neurological: He is alert and oriented to person, place, and time.  Skin: Skin is warm and dry.     CMP Latest Ref Rng & Units 11/15/2017  Glucose 65 - 99 mg/dL 189(H)  BUN 6 - 20 mg/dL 26(H)  Creatinine 0.61 - 1.24 mg/dL 1.11  Sodium 135 - 145 mmol/L 134(L)  Potassium 3.5 - 5.1 mmol/L 3.6  Chloride 101 - 111 mmol/L 97(L)  CO2 22 - 32 mmol/L 27  Calcium 8.9 - 10.3 mg/dL 9.1  Total Protein 6.5 - 8.1 g/dL 7.3  Total Bilirubin 0.3 - 1.2 mg/dL 0.7  Alkaline Phos 38 -  126 U/L 36(L)  AST 15 - 41 U/L 32  ALT 17 - 63 U/L 31   CBC Latest Ref Rng & Units 11/15/2017  WBC 3.8 - 10.6 K/uL 5.0  Hemoglobin 13.0 - 18.0 g/dL 12.0(L)  Hematocrit 40.0 - 52.0 % 36.3(L)  Platelets 150 - 440 K/uL 220     Assessment and plan- Patient is a 71 y.o. male  muscle invasive bladder cancer Stage II cT2N0cM0 s/p 5 cycles of neoadjuvant chemotherapy with gemcitabine/ cisplatin followed by laparoscopy-assisted cystoprostatectomy (yp T1 N0)  Overall patient has responded well to neoadjuvant chemotherapy.  He did have residual T1 tumor noted on final pathology.  No lymph  nodes were involved.  At this time he does not warrant any adjuvant treatment and will continue to be on surveillance and will be followed up by both me as well as urology.  He did have chemo-induced anemia which has improved now.    I will see him back in 4 months time with a CBC and CMP.  He will also continue to follow-up with [name].  I will plan to repeat his CT scan in about 6 months time.  Patient will continue to get his port flushed every 2 months.  If there is no evidence of recurrence in about a year or so we will plan to get his port out    Visit Diagnosis 1. Malignant neoplasm of posterior wall of urinary bladder (HCC)      Dr. Randa Evens, MD, MPH [facility] Pager- [phone] 11/15/2017 3:52 PM

## 2017-12-28 IMAGING — CT CT ABD-PELV W/ CM
2 of 5 series · 14 of 46 positions shown, 16 images · IV contrast (APPLIED)
Comparison: 03/09/2012

CLINICAL DATA: Bladder cancer.  History of prior appendectomy.

EXAM:
CT ABDOMEN AND PELVIS WITH CONTRAST
TECHNIQUE: Multidetector CT imaging of the abdomen and pelvis was performed
using the standard protocol following bolus administration of
intravenous contrast.
CONTRAST:  100mL ZD0I6R-4MM IOPAMIDOL (ZD0I6R-4MM) INJECTION 61%

[Series 2: routine abd/pel with · axial · 0.85mm/px · z∈[-502,-72]mm · 11 of 100 slices shown, 13 images]
[im 7/100  soft-tissue]
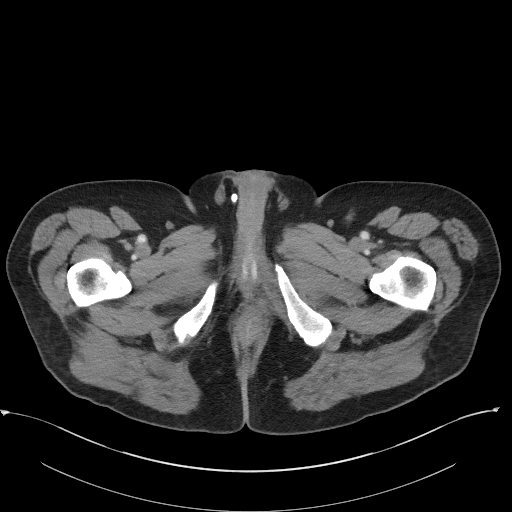
[im 7/100  bone]
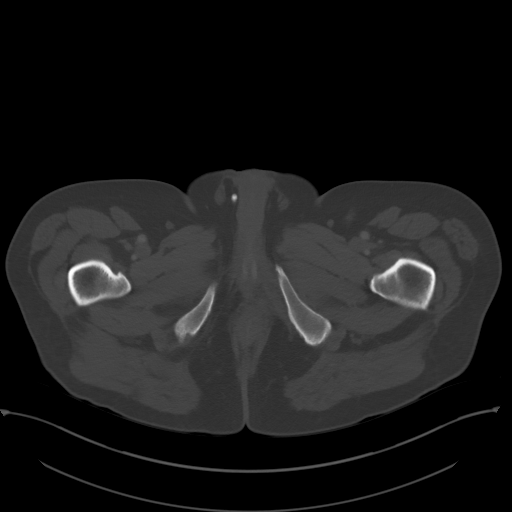
[im 14/100  soft-tissue]
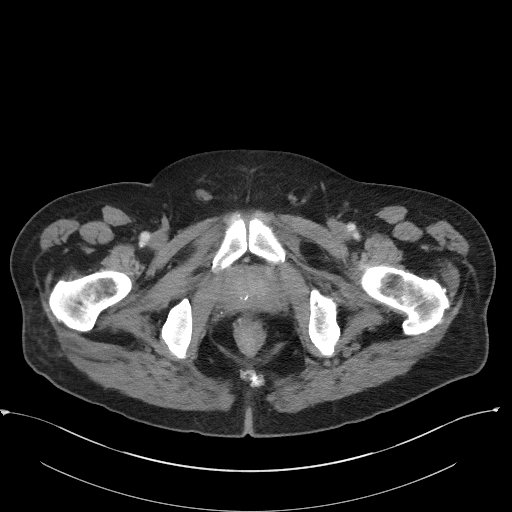
[im 27/100  soft-tissue]
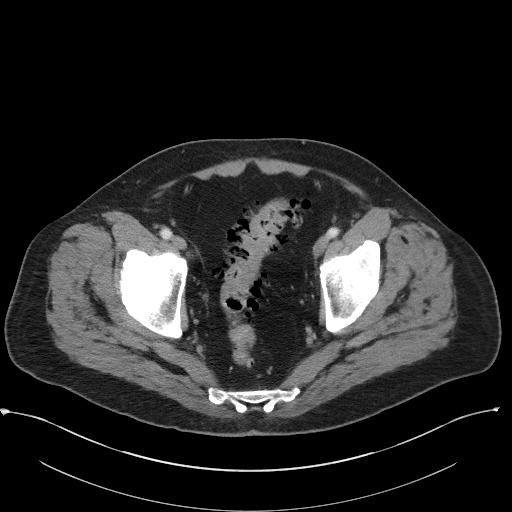
[im 34/100  soft-tissue]
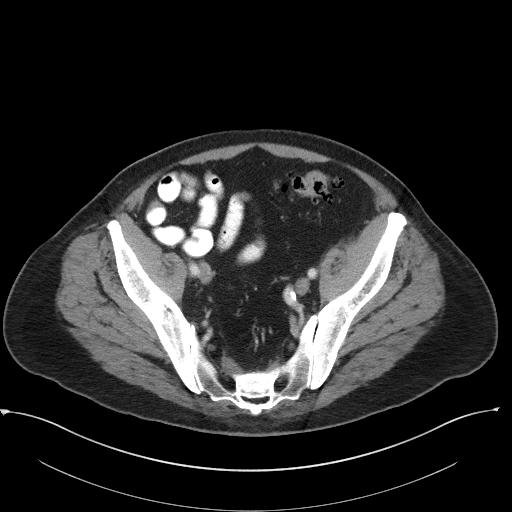
[im 40/100  soft-tissue]
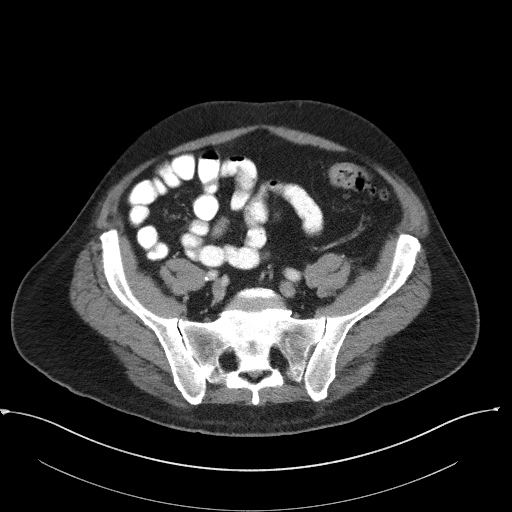
[im 53/100  soft-tissue]
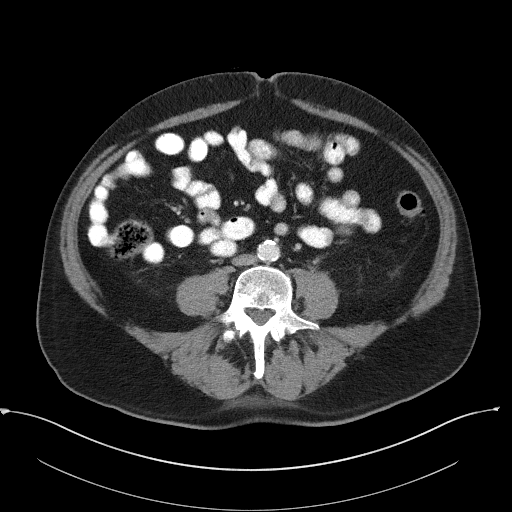
[im 60/100  soft-tissue]
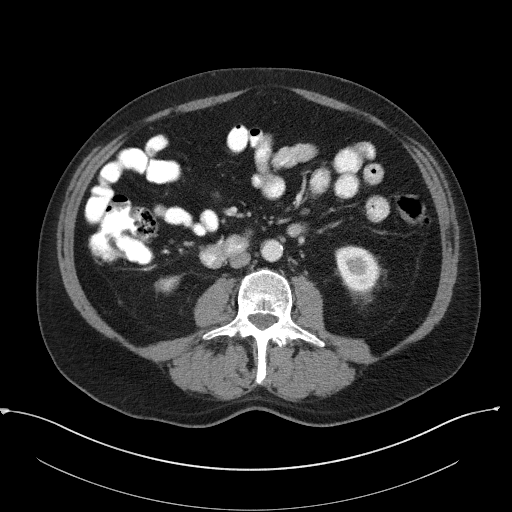
[im 67/100  soft-tissue]
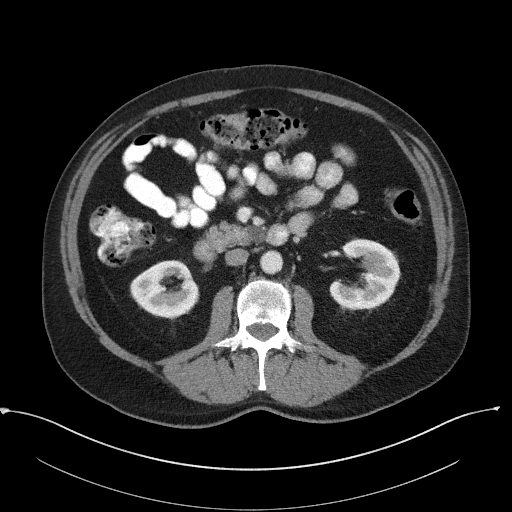
[im 73/100  soft-tissue]
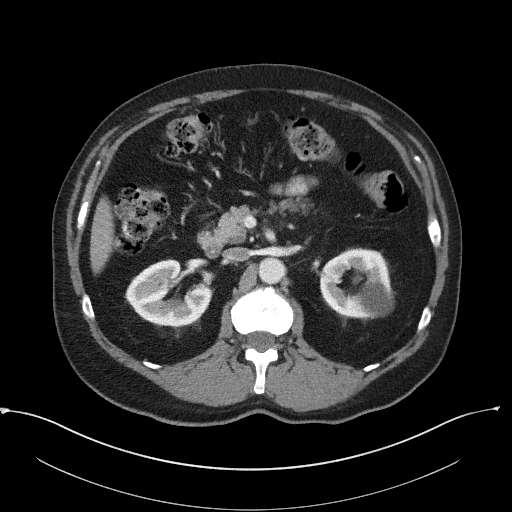
[im 73/100  bone]
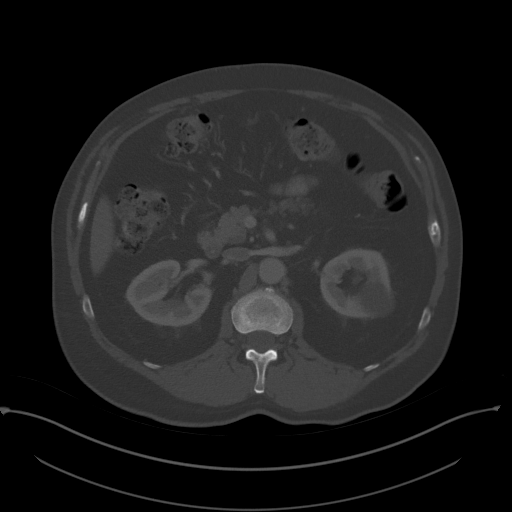
[im 86/100  soft-tissue]
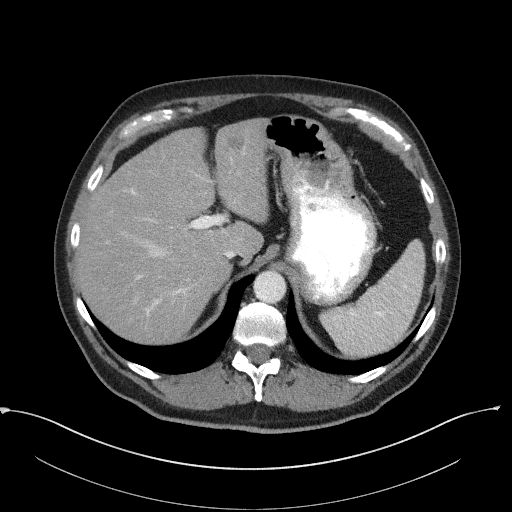
[im 93/100  soft-tissue]
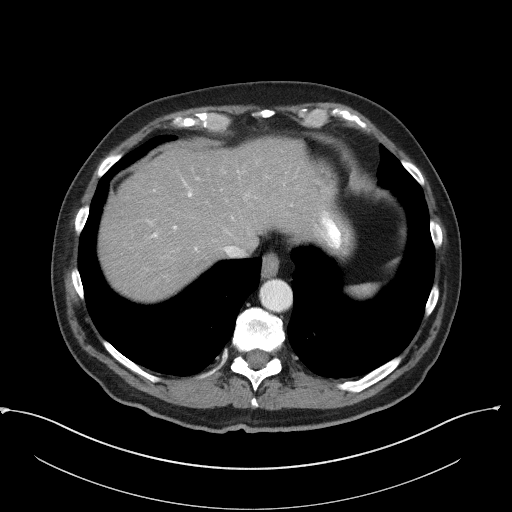

[Series 5: coronal st · coronal · 0.68mm/px · 3 of 101 slices shown]
[im 34/101  soft-tissue]
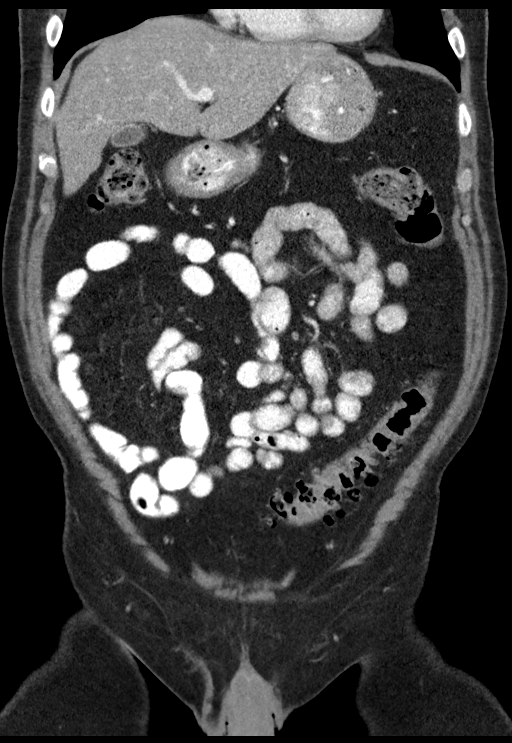
[im 45/101  soft-tissue]
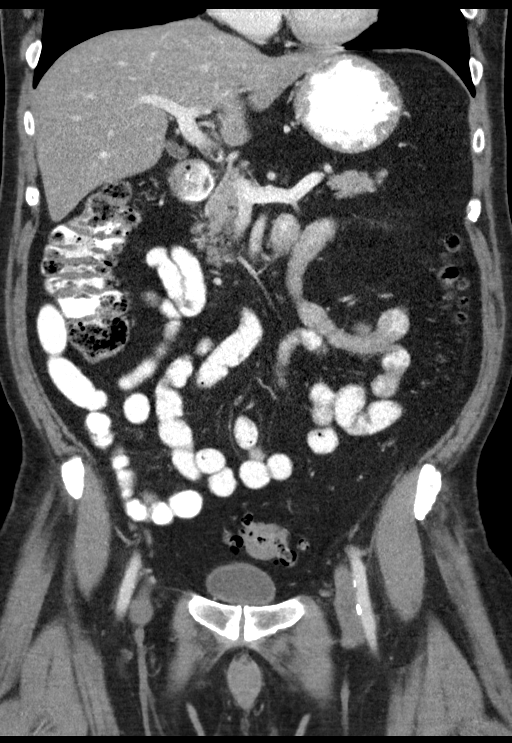
[im 56/101  soft-tissue]
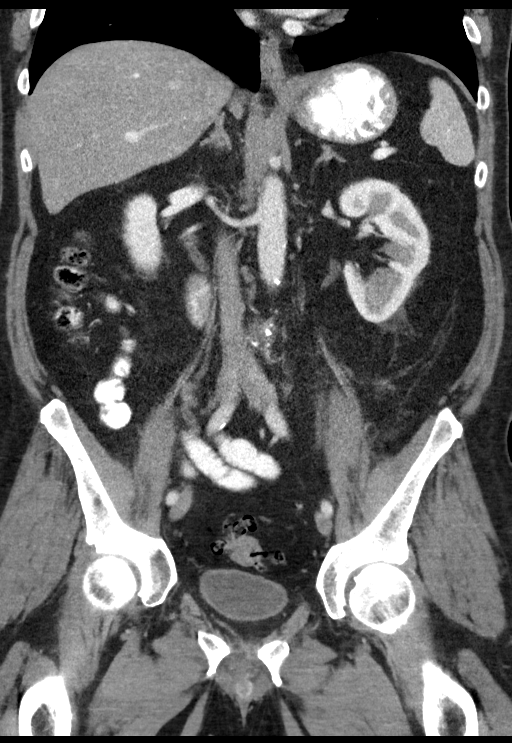

[14 of 46 positions shown; findings below may reference images not displayed]

FINDINGS: Lower chest:  Unremarkable.

Hepatobiliary: 2 cm cyst in the lateral segment left liver is
stable. Other scattered probable cysts in the left liver unchanged.
Tiny low-density lesion inferior right liver (image 20) is stable
common also likely a cyst. Gallbladder is decompressed. No
intrahepatic or extrahepatic biliary dilation.

Pancreas: 1.8 cm low-density, probable cystic lesion in the uncinate
process of the pancreas is stable. No dilatation the main pancreatic
duct.

Spleen: No splenomegaly. No focal mass lesion.

Adrenals/Urinary Tract: No adrenal nodule or mass. Right kidney
unremarkable. 4 cm simple cyst upper pole left kidney is unchanged.
No hydroureteronephrosis. Bladder unremarkable. Specifically, the
large mass lesion seen on the prior study is no longer evident
although bladder lumen not completely opacified on today's exam.

Stomach/Bowel: Stomach is nondistended. No gastric wall thickening.
No evidence of outlet obstruction. Duodenum is normally positioned
as is the ligament of Treitz. No small bowel wall thickening. No
small bowel dilatation. The terminal ileum is normal. The appendix
is not visualized, but there is no edema or inflammation in the
region of the cecum. Diverticular changes are noted in the left
colon without evidence of diverticulitis.

Vascular/Lymphatic: There is abdominal aortic atherosclerosis
without aneurysm. There is no gastrohepatic or hepatoduodenal
ligament lymphadenopathy. No intraperitoneal or retroperitoneal
lymphadenopathy.

Reproductive: The prostate gland and seminal vesicles have normal
imaging features.

Other: No intraperitoneal free fluid.

Musculoskeletal: 13 mm subtle sclerotic lesion is identified in the
upper aspect of the T12 vertebral body. This lesion was present on
the prior study and was also noted on the previous MRI. Bone scan of
04/10/2017 showed no active bony turnover at this location to
suggest metastatic deposit.
IMPRESSION: 1. Large intraluminal bladder lesions seen on the prior CT scan is
no longer evident. No findings to suggest metastatic disease in the
abdomen or pelvis on today's exam.
2. Stable hepatic cysts and other smaller liver lesions, too small
to characterize, but likely cysts.
3. No change 1.8 cm cystic lesion in the uncinate process of the
pancreas. Continued attention on follow-up imaging recommended.
4. Stable 4 cm simple cyst upper pole left kidney.
5. Stable appearance of the focal sclerotic lesion T12 vertebral
body.
6.  Aortic Atherosclerois (BNYNY-170.0)

## 2018-01-15 ENCOUNTER — Inpatient Hospital Stay: Payer: Medicare Other | Attending: Oncology

## 2018-01-15 DIAGNOSIS — C674 Malignant neoplasm of posterior wall of bladder: Secondary | ICD-10-CM | POA: Insufficient documentation

## 2018-01-15 DIAGNOSIS — Z452 Encounter for adjustment and management of vascular access device: Secondary | ICD-10-CM | POA: Insufficient documentation

## 2018-01-15 DIAGNOSIS — Z95828 Presence of other vascular implants and grafts: Secondary | ICD-10-CM

## 2018-01-15 MED ORDER — SODIUM CHLORIDE 0.9% FLUSH
10.0000 mL | INTRAVENOUS | Status: AC | PRN
  Administered 2018-01-15: 10 mL
  Filled 2018-01-15: qty 10

## 2018-01-15 MED ORDER — HEPARIN SOD (PORK) LOCK FLUSH 100 UNIT/ML IV SOLN
500.0000 [IU] | INTRAVENOUS | Status: AC | PRN
  Administered 2018-01-15: 500 [IU]

## 2018-03-14 ENCOUNTER — Encounter: Payer: Self-pay | Admitting: Oncology

## 2018-03-14 ENCOUNTER — Other Ambulatory Visit: Payer: Self-pay | Admitting: *Deleted

## 2018-03-14 ENCOUNTER — Inpatient Hospital Stay (HOSPITAL_BASED_OUTPATIENT_CLINIC_OR_DEPARTMENT_OTHER): Payer: Medicare Other | Admitting: Oncology

## 2018-03-14 ENCOUNTER — Inpatient Hospital Stay: Payer: Medicare Other | Attending: Oncology

## 2018-03-14 DIAGNOSIS — C679 Malignant neoplasm of bladder, unspecified: Secondary | ICD-10-CM | POA: Insufficient documentation

## 2018-03-14 DIAGNOSIS — Z95828 Presence of other vascular implants and grafts: Secondary | ICD-10-CM

## 2018-03-14 DIAGNOSIS — K869 Disease of pancreas, unspecified: Secondary | ICD-10-CM

## 2018-03-14 DIAGNOSIS — C674 Malignant neoplasm of posterior wall of bladder: Secondary | ICD-10-CM

## 2018-03-14 DIAGNOSIS — K635 Polyp of colon: Secondary | ICD-10-CM

## 2018-03-14 DIAGNOSIS — I4891 Unspecified atrial fibrillation: Secondary | ICD-10-CM

## 2018-03-14 LAB — CBC WITH DIFFERENTIAL/PLATELET
BASOS PCT: 1 %
Basophils Absolute: 0 10*3/uL (ref 0–0.1)
EOS ABS: 0.1 10*3/uL (ref 0–0.7)
Eosinophils Relative: 2 %
HCT: 40.2 % (ref 40.0–52.0)
HEMOGLOBIN: 13.8 g/dL (ref 13.0–18.0)
LYMPHS ABS: 2.1 10*3/uL (ref 1.0–3.6)
Lymphocytes Relative: 40 %
MCH: 31.5 pg (ref 26.0–34.0)
MCHC: 34.3 g/dL (ref 32.0–36.0)
MCV: 91.8 fL (ref 80.0–100.0)
MONOS PCT: 14 %
Monocytes Absolute: 0.8 10*3/uL (ref 0.2–1.0)
NEUTROS PCT: 43 %
Neutro Abs: 2.4 10*3/uL (ref 1.4–6.5)
Platelets: 192 10*3/uL (ref 150–440)
RBC: 4.38 MIL/uL — ABNORMAL LOW (ref 4.40–5.90)
RDW: 14.5 % (ref 11.5–14.5)
WBC: 5.4 10*3/uL (ref 3.8–10.6)

## 2018-03-14 LAB — COMPREHENSIVE METABOLIC PANEL
ALK PHOS: 38 U/L (ref 38–126)
ALT: 51 U/L (ref 17–63)
AST: 36 U/L (ref 15–41)
Albumin: 4 g/dL (ref 3.5–5.0)
Anion gap: 14 (ref 5–15)
BUN: 28 mg/dL — AB (ref 6–20)
CALCIUM: 9 mg/dL (ref 8.9–10.3)
CHLORIDE: 100 mmol/L — AB (ref 101–111)
CO2: 21 mmol/L — AB (ref 22–32)
CREATININE: 1.38 mg/dL — AB (ref 0.61–1.24)
GFR calc non Af Amer: 51 mL/min — ABNORMAL LOW (ref >60)
GFR, EST AFRICAN AMERICAN: 59 mL/min — AB (ref >60)
Glucose, Bld: 147 mg/dL — ABNORMAL HIGH (ref 65–99)
Potassium: 3.8 mmol/L (ref 3.5–5.1)
SODIUM: 135 mmol/L (ref 135–145)
Total Bilirubin: 0.8 mg/dL (ref 0.3–1.2)
Total Protein: 7.2 g/dL (ref 6.5–8.1)

## 2018-03-14 MED ORDER — HEPARIN SOD (PORK) LOCK FLUSH 100 UNIT/ML IV SOLN
500.0000 [IU] | INTRAVENOUS | Status: AC | PRN
  Administered 2018-03-14: 500 [IU]

## 2018-03-14 MED ORDER — SODIUM CHLORIDE 0.9% FLUSH
10.0000 mL | INTRAVENOUS | Status: AC | PRN
  Administered 2018-03-14: 10 mL
  Filled 2018-03-14: qty 10

## 2018-03-14 NOTE — Progress Notes (Signed)
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  [id number]  Chief complaint/ Reason for visit- routine f/u of bladder cancer  Heme/Onc history: 1. Patient is a 72 year old male with a past medical history significant for atrial fibrillation for which he was on Eliquis in the past. In August 2017 patient had minor trauma to his abdomen while bone pain and then left hematuria or immediately following that which lasted for about a couple of days. Patient thought that this was likely secondary to trauma . However continued to have intermittent hematuria since then and then had a second major episode of bleeding in March 2018. He spoke to his primary care doctor who suggested to stop Eliquis at this time. However bleeding continued for 2-3 days after stopping Eliquis and hence he was referred to urology [name]  2. Ct abdomen on 03/09/17 showed: IMPRESSION: 1. 5.6 cm polypoid mass arises from the posterior right bladder wall, consistent with urothelial neoplasm. 2. 2 cm exophytic hypoattenuating lesion identified along the uncinate process of the pancreas. Dedicated abdominal MRI without and with contrast may prove helpful to further evaluate.  3. MRI abdomen on 03/30/17 showed: . 1.8 cm simple appearing cystic lesion exophytic from the uncinate process of the pancreas is assisted with other scattered tiny pancreatic parenchymal cyst. Repeat MRI in 12 months is recommended. This recommendation follows ACR consensus guidelines: Management of Incidental Pancreatic Cysts: A White Paper of the ACR Incidental Findings Committee. J Am  Coll Radiol 9381;82:993-716. 2. 1 cm enhancing focus identified in the anterior aspect of the T12 vertebral body. Imaging features are not suggestive of cavernous hemangioma. Given the history of bladder cancer, metastatic involvement cannot be excluded. Bone scan may prove helpful to further evaluate.  4. Bone scan on 04/10/17 showed: IMPRESSION: 1. No abnormality is seen in the region of T12 to suggest a bone metastasis when compared to the prior MRI images. 2. Negative total body bone scan other than probable degenerative change of the facet joints of the lower lumbar spine.  5. CXr was negative for metastatic disease. Patient underwent TURBT on 04/02/17 which showed: DIAGNOSIS:  A. BLADDER TUMOR; TURBT:  - PAPILLARY UROTHELIAL CARCINOMA, INVADING MUSCULARIS PROPRIA,  HIGH-GRADE (WHO/ISUP).  6. Case was discussed at Tumor board and patient was thought to have atleast T2 cN0 muscle invasive bladder cancer. He has started neoadjuvant gem/cis chemotherapy. Since his counts did not hold up for D15 gemzar, plan is to give 2 weeks on 1 week of gem and split dose cisplatin. He has received 4 cycles of gem/cis 2 weeks on and 1 week off so far wich he has tolerated very well without significant side effects  7. Scans after 4 cycles of hemotherapy showed: 1. Large intraluminal bladder lesions seen on the prior CT scan is no longer evident. No findings to suggest metastatic disease in the abdomen or pelvis on today's exam. 2. Stable hepatic cysts and other smaller liver lesions, too small to characterize, but likely cysts. 3. No change 1.8 cm cystic lesion in the uncinate process of the pancreas. Continued attention on follow-up imaging recommended. 4. Stable 4 cm simple cyst upper pole left kidney. 5. Stable  appearance of the focal sclerotic lesion T12 vertebral body. 6. Aortic Atherosclerois (ICD10-170.0)  8. Patient completed 5 cycles of gem/cis chemotherapy   9.  Patient underwent  laparoscopic radical cystoprostatectomy with bilateral pelvic lymphadenectomy and I ileal conduit diversion with cystoscopy on 09/28/2017.  Pathology showed yp T1 a N0 M0.  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  10 lymph nodes were examined and were negative for malignancy.     Interval history- appetite is good. He has gaiend 17 pounds since last vsiit. Denies any hematuria. Energy levsl are good  ECOG PS- 0 Pain scale- 0   Review of systems- Review of Systems  Constitutional: Negative for chills, fever, malaise/fatigue and weight loss.  HENT: Negative for congestion, ear discharge and nosebleeds.   Eyes: Negative for blurred vision.  Respiratory: Negative for cough, hemoptysis, sputum production, shortness of breath and wheezing.   Cardiovascular: Negative for chest pain, palpitations, orthopnea and claudication.  Gastrointestinal: Negative for abdominal pain, blood in stool, constipation, diarrhea, heartburn, melena, nausea and vomiting.  Genitourinary: Negative for dysuria, flank pain, frequency, hematuria and urgency.  Musculoskeletal: Negative for back pain, joint pain and myalgias.  Skin: Negative for rash.  Neurological: Negative for dizziness, tingling, focal weakness, seizures, weakness and headaches.  Endo/Heme/Allergies: Does not bruise/bleed easily.  Psychiatric/Behavioral: Negative for depression and suicidal ideas. The patient does not have insomnia.       No Known Allergies   Past Medical History:  Diagnosis Date  . Cancer [facility])    Bladder  . Dysrhythmia    Atrial fib  . GERD (gastroesophageal reflux disease)   . Hyperlipidemia   . Hypertension   . Paroxysmal atrial fibrillation (HCC)   . Pre-diabetes    diet controlled  . Sexual dysfunction      Past Surgical History:  Procedure Laterality Date  . APPENDECTOMY    . COLONOSCOPY WITH PROPOFOL N/A 10/25/2015   Procedure: COLONOSCOPY WITH PROPOFOL;  Surgeon:  Manya Silvas, MD;  Location: [facility] ENDOSCOPY;  Service: Endoscopy;  Laterality: N/A;  . CYSTOSCOPY WITH INJECTION N/A 09/28/2017   Procedure: CYSTOSCOPY WITH INJECTION OF INDOCYANINE GREEN DYE;  Surgeon: Alexis Frock, MD;  Location: [facility] ORS;  Service: Urology;  Laterality: N/A;  . IR FLUORO GUIDE PORT INSERTION RIGHT  04/27/2017  . TRANSURETHRAL RESECTION OF BLADDER TUMOR N/A 04/02/2017   Procedure: TRANSURETHRAL RESECTION OF BLADDER TUMOR (TURBT);  Surgeon: Hollice Espy, MD;  Location: [facility] ORS;  Service: Urology;  Laterality: N/A;    Social History   Socioeconomic History  . Marital status: Married    Spouse name: Not on file  . Number of children: Not on file  . Years of education: Not on file  . Highest education level: Not on file  Occupational History  . Not on file  Social Needs  . Financial resource strain: Not on file  . Food insecurity:    Worry: Not on file    Inability: Not on file  . Transportation needs:    Medical: Not on file    Non-medical: Not on file  Tobacco Use  . Smoking status: Former Smoker    Packs/day: 1.00    Types: Cigarettes    Last attempt to quit: 10/21/1997    Years since quitting: 20.4  . Smokeless tobacco: Never Used  Substance and Sexual Activity  . Alcohol use: Yes    Comment: 1-2 glasses of wine daily  . Drug use: No  . Sexual activity: Not on file  Lifestyle  . Physical activity:    Days per week: Not on file    Minutes per session: Not on file  . Stress: Not on file  Relationships  . Social connections:    Talks on phone: Not on file    Gets together: Not on file    Attends religious service: Not on file    Active member of club or organization: Not on file    Attends meetings of clubs or organizations: Not on file    Relationship status: Not on file  . Intimate partner violence:    Fear of current or ex partner: Not on file    Emotionally abused: Not on file    Physically abused: Not on file    Forced sexual activity:  Not on file  Other Topics Concern  . Not on file  Social History Narrative  . Not on file    Family History  Problem Relation Age of Onset  . Prostate cancer Father   . Stroke Father   . Atrial fibrillation Father   . Diabetes type II Father   . Kidney cancer Neg Hx      Current Outpatient Medications:  .  acetaminophen (TYLENOL) 325 MG tablet, Take 650 mg by mouth every 6 (six) hours as needed., Disp: , Rfl:  .  apixaban (ELIQUIS) 5 MG TABS tablet, Take 5 mg by mouth 2 (two) times daily., Disp: , Rfl:  .  docusate sodium (COLACE) 100 MG capsule, Take 100 mg by mouth daily., Disp: , Rfl:  .  famotidine (PEPCID AC) 10 MG chewable tablet, Chew 10 mg by mouth daily as needed for heartburn., Disp: , Rfl:  .  fluticasone (FLONASE) 50 MCG/ACT nasal spray, Place 1 spray into both nostrils at bedtime as needed for allergies or rhinitis., Disp: , Rfl:  .  hydrochlorothiazide (HYDRODIURIL) 25 MG tablet, Take 25 mg by mouth daily., Disp: , Rfl:  .  metoprolol succinate (TOPROL-XL) 50 MG 24 hr tablet, Take 50 mg by mouth every evening. Take with or immediately following a meal. , Disp: , Rfl:  .  omeprazole (PRILOSEC) 20 MG capsule, Take 20 mg by mouth every morning. , Disp: , Rfl:  .  senna (SENOKOT) 8.6 MG TABS tablet, Take 2 tablets (17.2 mg total) by mouth at bedtime., Disp: 60 each, Rfl: 11  Physical exam:  Vitals:   03/14/18 1427  BP: 125/88  Pulse: 79  Resp: 18  Temp: 98.7 F (37.1 C)  TempSrc: Oral  SpO2: 99%  Weight: 217 lb 4.8 oz (98.6 kg)  Height: 6' (1.829 m)   Physical Exam  Constitutional: He is oriented to person, place, and time and well-developed, well-nourished, and in no distress.  HENT:  Head: Normocephalic and atraumatic.  Eyes: Pupils are equal, round, and reactive to light. EOM are normal.  Neck: Normal range of motion.  Cardiovascular: Normal rate and normal heart sounds.  irregular  Pulmonary/Chest: Effort normal and breath sounds normal.  Abdominal:  Soft. Bowel sounds are normal.  Ileal conduit in place draining clear urine  Neurological: He is alert and oriented to person, place, and time.  Skin: Skin is warm and dry.     CMP Latest Ref Rng & Units 11/15/2017  Glucose 65 - 99 mg/dL 189(H)  BUN 6 - 20 mg/dL 26(H)  Creatinine 0.61 - 1.24 mg/dL 1.11  Sodium 135 - 145 mmol/L 134(L)  Potassium 3.5 - 5.1 mmol/L 3.6  Chloride 101 - 111 mmol/L 97(L)  CO2  22 - 32 mmol/L 27  Calcium 8.9 - 10.3 mg/dL 9.1  Total Protein 6.5 - 8.1 g/dL 7.3  Total Bilirubin 0.3 - 1.2 mg/dL 0.7  Alkaline Phos 38 - 126 U/L 36(L)  AST 15 - 41 U/L 32  ALT 17 - 63 U/L 31   CBC Latest Ref Rng & Units 11/15/2017  WBC 3.8 - 10.6 K/uL 5.0  Hemoglobin 13.0 - 18.0 g/dL 12.0(L)  Hematocrit 40.0 - 52.0 % 36.3(L)  Platelets 150 - 440 K/uL 220     Assessment and plan- Patient is a 72 y.o. male muscle invasive bladder cancer Stage II cT2N0cM0 s/p5cycles of neoadjuvant chemotherapy with gemcitabine/ cisplatin followed by laparoscopy-assisted cystoprostatectomy (yp T1 N0)  1. Muscle invasive bladder cancer- clinically doing well. He will see [name] in May 2019 and will be getting CT abdomen plevis in April 2019  2. Exophytic pancreatic lesion noted on previous MRI- repeat MRI scheduled in April 2019 by [name]  I will see him in August with cbc/ cmp. He will continue port flushes every 2 months   Visit Diagnosis 1. Malignant neoplasm of urinary bladder, unspecified site ([place])   2. Lesion of pancreas      Dr. Randa Evens, MD, MPH [facility] Pager- [phone] 03/14/2018 3:18 PM

## 2018-03-14 NOTE — Progress Notes (Signed)
No new changes noted today 

## 2018-04-09 ENCOUNTER — Ambulatory Visit
Admission: RE | Admit: 2018-04-09 | Discharge: 2018-04-09 | Disposition: A | Discharge status: Home / Self Care | Payer: Medicare Other | Source: Ambulatory Visit | Attending: Oncology | Admitting: Oncology

## 2018-04-09 DIAGNOSIS — N281 Cyst of kidney, acquired: Secondary | ICD-10-CM | POA: Diagnosis not present

## 2018-04-09 DIAGNOSIS — C679 Malignant neoplasm of bladder, unspecified: Secondary | ICD-10-CM | POA: Diagnosis present

## 2018-04-09 DIAGNOSIS — K862 Cyst of pancreas: Secondary | ICD-10-CM | POA: Diagnosis not present

## 2018-04-09 DIAGNOSIS — K7689 Other specified diseases of liver: Secondary | ICD-10-CM | POA: Insufficient documentation

## 2018-04-09 DIAGNOSIS — K869 Disease of pancreas, unspecified: Secondary | ICD-10-CM | POA: Diagnosis present

## 2018-04-09 DIAGNOSIS — K760 Fatty (change of) liver, not elsewhere classified: Secondary | ICD-10-CM | POA: Diagnosis not present

## 2018-04-09 MED ORDER — GADOBENATE DIMEGLUMINE 529 MG/ML IV SOLN
20.0000 mL | Freq: Once | INTRAVENOUS | Status: AC | PRN
  Administered 2018-04-09: 20 mL via INTRAVENOUS

## 2018-04-12 ENCOUNTER — Other Ambulatory Visit: Payer: Self-pay | Admitting: Urology

## 2018-04-12 ENCOUNTER — Ambulatory Visit (HOSPITAL_COMMUNITY)
Admission: RE | Admit: 2018-04-12 | Discharge: 2018-04-12 | Disposition: A | Discharge status: Home / Self Care | Payer: Medicare Other | Source: Ambulatory Visit | Attending: Urology | Admitting: Urology

## 2018-04-12 DIAGNOSIS — C670 Malignant neoplasm of trigone of bladder: Secondary | ICD-10-CM | POA: Insufficient documentation

## 2018-05-02 ENCOUNTER — Inpatient Hospital Stay (HOSPITAL_BASED_OUTPATIENT_CLINIC_OR_DEPARTMENT_OTHER): Payer: Medicare Other | Admitting: Oncology

## 2018-05-02 ENCOUNTER — Inpatient Hospital Stay: Payer: Medicare Other | Attending: Oncology

## 2018-05-02 ENCOUNTER — Inpatient Hospital Stay: Payer: Medicare Other

## 2018-05-02 VITALS — BP 133/92 | HR 84 | Temp 97.7°F | Wt 219.5 lb

## 2018-05-02 DIAGNOSIS — C679 Malignant neoplasm of bladder, unspecified: Secondary | ICD-10-CM

## 2018-05-02 DIAGNOSIS — Z95828 Presence of other vascular implants and grafts: Secondary | ICD-10-CM

## 2018-05-02 DIAGNOSIS — I10 Essential (primary) hypertension: Secondary | ICD-10-CM | POA: Insufficient documentation

## 2018-05-02 DIAGNOSIS — Z8551 Personal history of malignant neoplasm of bladder: Secondary | ICD-10-CM

## 2018-05-02 MED ORDER — HEPARIN SOD (PORK) LOCK FLUSH 100 UNIT/ML IV SOLN: 500.0000 [IU] | Freq: Once | INTRAVENOUS | Status: DC

## 2018-05-02 MED ORDER — SODIUM CHLORIDE 0.9% FLUSH
10.0000 mL | Freq: Once | INTRAVENOUS | Status: DC
  Filled 2018-05-02: qty 10

## 2018-05-02 NOTE — Progress Notes (Signed)
Survivorship Clinic Consult Note [name]  Telephone:([phone] [phone]  CLINIC:  Survivorship  REASON FOR VISIT:  Long-term survivorship surveillance visit for patient with history of muscle invasive bladder cancer stage II.  BRIEF ONCOLOGIC HISTORY:  Oncology History   1. Patient is a 72 year old male with a past medical history significant for atrial fibrillation for which he was on Eliquis in the past. In August 2017 patient had minor trauma to his abdomen while bone pain and then left hematuria or immediately following that which lasted for about a couple of days. Patient thought that this was likely secondary to trauma . However continued to have intermittent hematuria since then and then had a second major episode of bleeding in March 2018. He spoke to his primary care doctor who suggested to stop Eliquis at this time. However bleeding continued for 2-3 days after stopping Eliquis and hence he was referred to urology [name]  2. Ct abdomen on 03/09/17 showed: IMPRESSION: 1. 5.6 cm polypoid mass arises from the posterior right bladder wall, consistent with urothelial neoplasm. 2. 2 cm exophytic hypoattenuating lesion identified along the uncinate process of the pancreas. Dedicated abdominal MRI without and with contrast may prove helpful to further evaluate.  3. MRI abdomen on 03/30/17 showed: . 1.8 cm simple appearing cystic lesion exophytic from the uncinate process of the pancreas is assisted with other scattered tiny pancreatic parenchymal cyst. Repeat MRI in 12 months is recommended. This recommendation follows ACR consensus guidelines: Management of Incidental Pancreatic Cysts: A White Paper of the ACR Incidental Findings Committee. J Am Coll Radiol 9518;84:166-063. 2. 1 cm enhancing focus identified in the anterior aspect of the T12 vertebral body. Imaging features are not suggestive of cavernous hemangioma. Given the history of  bladder cancer, metastatic involvement cannot be excluded. Bone scan may prove helpful to further evaluate.  4. Bone scan on 04/10/17 showed: IMPRESSION: 1. No abnormality is seen in the region of T12 to suggest a bone metastasis when compared to the prior MRI images. 2. Negative total body bone scan other than probable degenerative change of the facet joints of the lower lumbar spine.  5. CXr was negative for metastatic disease. Patient underwent TURBT on 04/02/17 which showed: DIAGNOSIS:  A. BLADDER TUMOR; TURBT:  - PAPILLARY UROTHELIAL CARCINOMA, INVADING MUSCULARIS PROPRIA,  HIGH-GRADE (WHO/ISUP).  6. Case was discussed at Tumor board and patient was thought to have atleast T2 cN0 muscle invasive bladder cancer. He has started neoadjuvant gem/cis chemotherapy. Since his counts did not hold up for D15 gemzar, plan is to give 2 weeks on 1 week of gem and split dose cisplatin. He has received 4 cycles of gem/cis 2 weeks on and 1 week off so far wich he has tolerated very well without significant side effects  7. Scans after 4 cycles of hemotherapy showed: 1. Large intraluminal bladder lesions seen on the prior CT scan is no longer evident. No findings to suggest metastatic disease in the abdomen or pelvis on today's exam. 2. Stable hepatic cysts and other smaller liver lesions, too small to characterize, but likely cysts. 3. No change 1.8 cm cystic lesion in the uncinate process of the pancreas. Continued attention on follow-up imaging recommended. 4. Stable 4 cm simple cyst upper pole left kidney. 5. Stable appearance of the focal sclerotic lesion T12 vertebral body. 6. Aortic Atherosclerois (ICD10-170.0)  8. Patient completed 5 cycles of gem/cis chemotherapy  9.Patient underwent laparoscopic radical cystoprostatectomy with bilateral pelvic lymphadenectomy and I  ileal conduit diversion with cystoscopy on 09/28/2017. Pathology showed ypT1 a N0 M0. There was residual  high-grade urothelial carcinoma with lamina propria invasion. 10 lymph nodes were examined and were negative for malignancy.       Bladder cancer ([place])   04/02/2017 Initial Diagnosis    Bladder cancer [facility])       INTERVAL HISTORY: Patient last seen by primary medical oncologist Dr. Janese Banks on 03/14/2018  He appeared to be doing well.  His appetite had improved.  He had gained 17 pounds since that visit.  He denied hematuria.  His energy levels were good.  He was scheduled to follow-up with urology [name] in May 2019 and CT abdomen pelvis in April 2018.  Had follow-up on pancreatic lesion noted on previous MRI scheduled for April 2019. MRI revealed stable small pancreatic cysts, likely benign postinflammatory. Recommend one year follow-up.   ADDITIONAL REVIEW OF SYSTEMS:  Review of Systems  Constitutional: Negative.  Negative for chills, fever, malaise/fatigue and weight loss.  HENT: Negative for congestion and ear pain.   Eyes: Negative.  Negative for blurred vision and double vision.  Respiratory: Negative.  Negative for cough, sputum production and shortness of breath.   Cardiovascular: Negative.  Negative for chest pain, palpitations and leg swelling.  Gastrointestinal: Negative.  Negative for abdominal pain, constipation, diarrhea, nausea and vomiting.  Genitourinary: Negative for dysuria, frequency and urgency.  Musculoskeletal: Negative for back pain and falls.  Skin: Negative.  Negative for rash.  Neurological: Negative.  Negative for weakness and headaches.  Endo/Heme/Allergies: Negative.  Does not bruise/bleed easily.  Psychiatric/Behavioral: Negative.  Negative for depression. The patient is not nervous/anxious and does not have insomnia.     PAST MEDICAL & SURGICAL HISTORY:  Past Medical History:  Diagnosis Date  . Cancer [facility])    Bladder  . Dysrhythmia    Atrial fib  . GERD (gastroesophageal reflux disease)   . Hyperlipidemia   . Hypertension   . Paroxysmal atrial  fibrillation ([facility])   . Pre-diabetes    diet controlled  . Sexual dysfunction    Past Surgical History:  Procedure Laterality Date  . APPENDECTOMY    . COLONOSCOPY WITH PROPOFOL N/A 10/25/2015   Procedure: COLONOSCOPY WITH PROPOFOL;  Surgeon: Manya Silvas, MD;  Location: [facility] ENDOSCOPY;  Service: Endoscopy;  Laterality: N/A;  . CYSTOSCOPY WITH INJECTION N/A 09/28/2017   Procedure: CYSTOSCOPY WITH INJECTION OF INDOCYANINE GREEN DYE;  Surgeon: Alexis Frock, MD;  Location: [facility] ORS;  Service: Urology;  Laterality: N/A;  . IR FLUORO GUIDE PORT INSERTION RIGHT  04/27/2017  . TRANSURETHRAL RESECTION OF BLADDER TUMOR N/A 04/02/2017   Procedure: TRANSURETHRAL RESECTION OF BLADDER TUMOR (TURBT);  Surgeon: Hollice Espy, MD;  Location: [facility] ORS;  Service: Urology;  Laterality: N/A;    SOCIAL HISTORY: Married. Lives in [place].    CURRENT MEDICATIONS:  Current Outpatient Medications on File Prior to Visit  Medication Sig Dispense Refill  . acetaminophen (TYLENOL) 325 MG tablet Take 650 mg by mouth every 6 (six) hours as needed.    Marland Kitchen apixaban (ELIQUIS) 5 MG TABS tablet Take 5 mg by mouth 2 (two) times daily.    Marland Kitchen docusate sodium (COLACE) 100 MG capsule Take 100 mg by mouth daily.    . famotidine (PEPCID AC) 10 MG chewable tablet Chew 10 mg by mouth daily as needed for heartburn.    . fluticasone (FLONASE) 50 MCG/ACT nasal spray Place 1 spray into both nostrils at bedtime as needed for allergies or  rhinitis.    . hydrochlorothiazide (HYDRODIURIL) 25 MG tablet Take 25 mg by mouth daily.    . metoprolol succinate (TOPROL-XL) 50 MG 24 hr tablet Take 50 mg by mouth every evening. Take with or immediately following a meal.     . omeprazole (PRILOSEC) 20 MG capsule Take 20 mg by mouth every morning.     . senna (SENOKOT) 8.6 MG TABS tablet Take 2 tablets (17.2 mg total) by mouth at bedtime. (Patient not taking: Reported on 03/14/2018) 60 each 11   No current facility-administered medications on  file prior to visit.     ALLERGIES:  No Known Allergies  PHYSICAL EXAM:  Vitals:   05/02/18 1314  BP: (!) 133/92  Pulse: 84  Temp: 97.7 F (36.5 C)   Filed Weights   05/02/18 1314  Weight: 219 lb 8 oz (99.6 kg)    Physical Exam  Constitutional: He is oriented to person, place, and time. Vital signs are normal.  HENT:  Head: Normocephalic and atraumatic.  Eyes: Pupils are equal, round, and reactive to light.  Neck: Normal range of motion.  Cardiovascular: Normal rate, regular rhythm and normal heart sounds.  No murmur heard. Pulmonary/Chest: Effort normal and breath sounds normal. He has no wheezes.  Abdominal: Soft. Normal appearance and bowel sounds are normal. He exhibits no distension. There is no tenderness.  Ileal conduit in place  Musculoskeletal: Normal range of motion. He exhibits no edema.  Neurological: He is alert and oriented to person, place, and time.  Skin: Skin is warm and dry. No rash noted.  Psychiatric: Judgment normal.    LABORATORY DATA:  03/14/18  Results for JABRE, HEO (MRN [id number]) as of 05/17/2018 15:03  Ref. Range 03/14/2018 14:17  COMPREHENSIVE METABOLIC PANEL Unknown Rpt (A)  Sodium Latest Ref Range: 135 - 145 mmol/L 135  Potassium Latest Ref Range: 3.5 - 5.1 mmol/L 3.8  Chloride Latest Ref Range: 101 - 111 mmol/L 100 (L)  CO2 Latest Ref Range: 22 - 32 mmol/L 21 (L)  Glucose Latest Ref Range: 65 - 99 mg/dL 147 (H)  BUN Latest Ref Range: 6 - 20 mg/dL 28 (H)  Creatinine Latest Ref Range: 0.61 - 1.24 mg/dL 1.38 (H)  Calcium Latest Ref Range: 8.9 - 10.3 mg/dL 9.0  Anion gap Latest Ref Range: 5 - 15  14  Alkaline Phosphatase Latest Ref Range: 38 - 126 U/L 38  Albumin Latest Ref Range: 3.5 - 5.0 g/dL 4.0  AST Latest Ref Range: 15 - 41 U/L 36  ALT Latest Ref Range: 17 - 63 U/L 51  Total Protein Latest Ref Range: 6.5 - 8.1 g/dL 7.2  Total Bilirubin Latest Ref Range: 0.3 - 1.2 mg/dL 0.8  GFR, Est Non African American Latest Ref  Range: >60 mL/min 51 (L)  GFR, Est African American Latest Ref Range: >60 mL/min 59 (L)  WBC Latest Ref Range: 3.8 - 10.6 K/uL 5.4  RBC Latest Ref Range: 4.40 - 5.90 MIL/uL 4.38 (L)  Hemoglobin Latest Ref Range: 13.0 - 18.0 g/dL 13.8  HCT Latest Ref Range: 40.0 - 52.0 % 40.2  MCV Latest Ref Range: 80.0 - 100.0 fL 91.8  MCH Latest Ref Range: 26.0 - 34.0 pg 31.5  MCHC Latest Ref Range: 32.0 - 36.0 g/dL 34.3  RDW Latest Ref Range: 11.5 - 14.5 % 14.5  Platelets Latest Ref Range: 150 - 440 K/uL 192  Neutrophils Latest Units: % 43  Lymphocytes Latest Units: % 40  Monocytes Relative Latest Units: % 14  Eosinophil Latest Units: % 2  Basophil Latest Units: % 1  NEUT# Latest Ref Range: 1.4 - 6.5 K/uL 2.4  Lymphocyte # Latest Ref Range: 1.0 - 3.6 K/uL 2.1  Monocyte # Latest Ref Range: 0.2 - 1.0 K/uL 0.8  Eosinophils Absolute Latest Ref Range: 0 - 0.7 K/uL 0.1  Basophils Absolute Latest Ref Range: 0 - 0.1 K/uL 0.0      DIAGNOSTIC IMAGING:   MRI abdomen 04/09/18 IMPRESSION: 1. Stable small pancreatic cysts, likely benign postinflammatory cysts. No worrisome MR imaging features. Recommend follow-up examination in 1 year to document stability. 2. Stable hepatic cysts and left renal cyst. 3. Diffuse fatty infiltration of the liver. 4. No acute abdominal findings or lymphadenopathy  Chest X-ray 04/12/18 IMPRESSION: No active cardiopulmonary disease  ASSESSMENT & PLAN:  Mr. Randy Wheeler is a pleasant 72 y.o. male with history of Stage II muscle invasive bladder cancer, treated with Cisplatin and Gemzar completed treatment on 08/02/18. Patient presents to survivorship clinic today for survivorship care plan visit and to address any acute survivorship concerns since completing treatment.    1. History of Stage II muscle invasive bladder cancer: Clinically, he is without evidence of disease recurrence based on physical exam/diagnostic imaging.  Today, he received a copy of his survivorship care plan  (SCP) document, which was reviewed with him in detail.  The SCP details his cancer treatment history and potential late/long-term side effects of those treatments.  We discussed the follow-up schedule he can anticipate with interval imaging for surveillance of his cancer.  I have also shared a copy of his treatment summary/SCP with his PCP. Mr. Stracener will return to the survivorship clinic as needed; he will return to [place] [facility] for surveillance visit with Dr. Janese Banks in 3 months.    2. Problem at visit: None  3. Smoking cessation: I recommend Mr. Seidel's continued efforts to remain tobacco-free.  He is committed to abstaining from tobacco.  4. Physical activity/Healthy eating: Getting adequate physical activity and maintaining a healthy diet as a cancer survivor is important for overall wellness and reduces the risk of cancer recurrence. We discussed the CARE program which is a fitness program that is offered to cancer survivors free of charge.  We also reviewed the American Cancer Society's booklet with recommendations for nutrition and physical activity.    4. Health promotion/Cancer screening:  Mr. Haye is reportedly up-to-date on his colonoscopy, PSA tests, skin screenings, and vaccinations.  I encouraged him to talk with his PCP about arranging appropriate cancer screening tests, as appropriate.   5. Support services/Counseling: Mr. Hoban was seen today in in effort to address both the physical and social concerns of our cancer survivors at [facility]. It is not uncommon for this period of the patient's cancer care trajectory to be one of many emotions and stressors.  I provided support today through active listening, validation of concerns, and expressive supportive counseling.  Mr. Sieling was encouraged to take advantage of our support services programs and support groups to better cope in his new life as a cancer survivor after  completing anti-cancer treatment.   Dispo:  -Return to survivorship clinic as needed; no additional follow-up needed at this time. He is scheduled to return to clinic to see Dr. Janese Banks on 08/13/18 with labs and assessment.  -Consider transitioning the patient to long-term survivorship, when clinically appropriate.   A total of 30 minutes was spent in face-to-face care of this patient, with greater than  50% of that time spent in counseling and care coordination.    Rulon Abide, AGNP-C [facility] [place] (office) 05/02/18 1:28 PM

## 2018-05-02 NOTE — Progress Notes (Signed)
Survivorship Care Plan visit completed.  Treatment summary reviewed and given to patient.  ASCO answers booklet reviewed and given to patient.  CARE program and Cancer Transitions discussed with patient along with other resources cancer center offers to patients and caregivers.  Patient verbalized understanding.    

## 2018-06-21 ENCOUNTER — Inpatient Hospital Stay: Payer: Medicare Other | Attending: Oncology

## 2018-06-21 DIAGNOSIS — Z8551 Personal history of malignant neoplasm of bladder: Secondary | ICD-10-CM | POA: Insufficient documentation

## 2018-06-21 DIAGNOSIS — Z452 Encounter for adjustment and management of vascular access device: Secondary | ICD-10-CM | POA: Diagnosis present

## 2018-06-21 DIAGNOSIS — Z95828 Presence of other vascular implants and grafts: Secondary | ICD-10-CM

## 2018-06-21 MED ORDER — HEPARIN SOD (PORK) LOCK FLUSH 100 UNIT/ML IV SOLN
500.0000 [IU] | Freq: Once | INTRAVENOUS | Status: AC
  Administered 2018-06-21: 500 [IU] via INTRAVENOUS
  Filled 2018-06-21: qty 5

## 2018-06-21 MED ORDER — SODIUM CHLORIDE 0.9% FLUSH
10.0000 mL | Freq: Once | INTRAVENOUS | Status: AC
  Administered 2018-06-21: 10 mL via INTRAVENOUS
  Filled 2018-06-21: qty 10

## 2018-08-12 ENCOUNTER — Other Ambulatory Visit: Payer: Self-pay

## 2018-08-12 DIAGNOSIS — C674 Malignant neoplasm of posterior wall of bladder: Secondary | ICD-10-CM

## 2018-08-13 ENCOUNTER — Encounter: Payer: Self-pay | Admitting: Oncology

## 2018-08-13 ENCOUNTER — Inpatient Hospital Stay: Payer: Medicare Other

## 2018-08-13 ENCOUNTER — Inpatient Hospital Stay: Payer: Medicare Other | Attending: Oncology | Admitting: Oncology

## 2018-08-13 VITALS — BP 122/84 | HR 92 | Temp 97.7°F | Resp 18 | Ht 72.0 in | Wt 220.1 lb

## 2018-08-13 DIAGNOSIS — Z8551 Personal history of malignant neoplasm of bladder: Secondary | ICD-10-CM

## 2018-08-13 DIAGNOSIS — I10 Essential (primary) hypertension: Secondary | ICD-10-CM

## 2018-08-13 DIAGNOSIS — C674 Malignant neoplasm of posterior wall of bladder: Secondary | ICD-10-CM

## 2018-08-13 DIAGNOSIS — Z08 Encounter for follow-up examination after completed treatment for malignant neoplasm: Secondary | ICD-10-CM

## 2018-08-13 LAB — COMPREHENSIVE METABOLIC PANEL
ALT: 53 U/L — AB (ref 0–44)
ANION GAP: 7 (ref 5–15)
AST: 42 U/L — ABNORMAL HIGH (ref 15–41)
Albumin: 3.9 g/dL (ref 3.5–5.0)
Alkaline Phosphatase: 35 U/L — ABNORMAL LOW (ref 38–126)
BUN: 24 mg/dL — ABNORMAL HIGH (ref 8–23)
CHLORIDE: 101 mmol/L (ref 98–111)
CO2: 29 mmol/L (ref 22–32)
CREATININE: 1.37 mg/dL — AB (ref 0.61–1.24)
Calcium: 9.3 mg/dL (ref 8.9–10.3)
GFR, EST AFRICAN AMERICAN: 59 mL/min — AB (ref >60)
GFR, EST NON AFRICAN AMERICAN: 51 mL/min — AB (ref >60)
Glucose, Bld: 130 mg/dL — ABNORMAL HIGH (ref 70–99)
POTASSIUM: 4 mmol/L (ref 3.5–5.1)
Sodium: 137 mmol/L (ref 135–145)
Total Bilirubin: 1 mg/dL (ref 0.3–1.2)
Total Protein: 7.1 g/dL (ref 6.5–8.1)

## 2018-08-13 LAB — CBC WITH DIFFERENTIAL/PLATELET
Basophils Absolute: 0 10*3/uL (ref 0–0.1)
Basophils Relative: 1 %
Eosinophils Absolute: 0.1 10*3/uL (ref 0–0.7)
Eosinophils Relative: 1 %
HEMATOCRIT: 40.5 % (ref 40.0–52.0)
HEMOGLOBIN: 13.8 g/dL (ref 13.0–18.0)
LYMPHS ABS: 2 10*3/uL (ref 1.0–3.6)
LYMPHS PCT: 34 %
MCH: 31.8 pg (ref 26.0–34.0)
MCHC: 34.1 g/dL (ref 32.0–36.0)
MCV: 93.2 fL (ref 80.0–100.0)
MONOS PCT: 13 %
Monocytes Absolute: 0.8 10*3/uL (ref 0.2–1.0)
NEUTROS ABS: 3 10*3/uL (ref 1.4–6.5)
NEUTROS PCT: 51 %
Platelets: 184 10*3/uL (ref 150–440)
RBC: 4.35 MIL/uL — AB (ref 4.40–5.90)
RDW: 14 % (ref 11.5–14.5)
WBC: 5.8 10*3/uL (ref 3.8–10.6)

## 2018-08-13 MED ORDER — SODIUM CHLORIDE 0.9% FLUSH
10.0000 mL | Freq: Once | INTRAVENOUS | Status: AC
  Administered 2018-08-13: 10 mL via INTRAVENOUS
  Filled 2018-08-13: qty 10

## 2018-08-13 MED ORDER — HEPARIN SOD (PORK) LOCK FLUSH 100 UNIT/ML IV SOLN
500.0000 [IU] | Freq: Once | INTRAVENOUS | Status: AC
  Administered 2018-08-13: 500 [IU] via INTRAVENOUS
  Filled 2018-08-13: qty 5

## 2018-08-13 NOTE — Progress Notes (Signed)
No new changes noted today 

## 2018-08-15 NOTE — Progress Notes (Signed)
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 Patient is a 72 year old male with a past medical history significant for atrial fibrillation for which he was on Eliquis in the past. In August 2017 patient had minor trauma to his abdomen while bone pain and then left hematuria or immediately following that which lasted for about a couple of days. Patient thought that this was likely secondary to trauma . However continued to have intermittent hematuria since then and then had a second major episode of bleeding in March 2018. He spoke to his primary care doctor who suggested to stop Eliquis at this time. However bleeding continued for 2-3 days after stopping Eliquis and hence he was referred to urology [name]  2. Ct abdomen on 03/09/17 showed: IMPRESSION: 1. 5.6 cm polypoid mass arises from the posterior right bladder wall, consistent with urothelial neoplasm. 2. 2 cm exophytic hypoattenuating lesion identified along the uncinate process of the pancreas. Dedicated abdominal MRI without and with contrast may prove helpful to further evaluate.  3. MRI abdomen on 03/30/17 showed: . 1.8 cm simple appearing cystic lesion exophytic from the uncinate process of the pancreas is assisted with other scattered tiny pancreatic  parenchymal cyst. Repeat MRI in 12 months is recommended. This recommendation follows ACR consensus guidelines: Management of Incidental Pancreatic Cysts: A White Paper of the ACR Incidental Findings Committee. J Am Coll Radiol 6213;08:657-846. 2. 1 cm enhancing focus identified in the anterior aspect of the T12 vertebral body. Imaging features are not suggestive of cavernous hemangioma. Given the history of bladder cancer, metastatic involvement cannot be excluded. Bone scan may prove helpful to further evaluate.  4. Bone scan on 04/10/17 showed: IMPRESSION: 1. No abnormality is seen in the region of T12 to suggest a bone metastasis when compared to the prior MRI images. 2. Negative total body bone scan other than probable degenerative change of the facet joints of the lower lumbar spine.  5. CXr was negative for metastatic disease. Patient underwent TURBT on 04/02/17 which showed: DIAGNOSIS:  A. BLADDER TUMOR; TURBT:  - PAPILLARY UROTHELIAL CARCINOMA, INVADING MUSCULARIS PROPRIA,  HIGH-GRADE (WHO/ISUP).  6. Case was discussed at Tumor board and patient was thought to have atleast T2 cN0 muscle invasive bladder cancer. He has started neoadjuvant gem/cis chemotherapy. Since his counts did not hold up for D15 gemzar, plan is to give 2 weeks on 1 week of gem and split dose cisplatin. He has received 4 cycles of gem/cis 2 weeks on and 1 week off so far wich he has tolerated very well without significant side effects  7. Scans after 4 cycles of hemotherapy showed: 1. Large intraluminal bladder lesions seen on the prior CT scan is no longer evident. No findings to suggest metastatic disease in the abdomen or pelvis on today's exam. 2. Stable hepatic cysts and other smaller liver lesions, too small to characterize, but likely cysts. 3. No change 1.8 cm cystic lesion in the  uncinate process of the pancreas. Continued attention on follow-up imaging recommended. 4. Stable 4 cm simple cyst  upper pole left kidney. 5. Stable appearance of the focal sclerotic lesion T12 vertebral body. 6. Aortic Atherosclerois (ICD10-170.0)  8. Patient completed 5 cycles of gem/cis chemotherapy  9.Patient underwent laparoscopic radical cystoprostatectomy with bilateral pelvic lymphadenectomy and I ileal conduit diversion with cystoscopy on 09/28/2017. Pathology showed ypT1 a N0 M0. 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 10 lymph nodes were examined and were negative for malignancy.   Interval history- he is doing well. Appetite is good. Denies any fatigue or unintentional weight loss. No issues with ileal conduit. He has not noticed any blood in urine.   ECOG PS- 0 Pain scale- 0 Opioid associated constipation- no  Review of systems- Review of Systems  Constitutional: Negative for chills, fever, malaise/fatigue and weight loss.  HENT: Negative for congestion, ear discharge and nosebleeds.   Eyes: Negative for blurred vision.  Respiratory: Negative for cough, hemoptysis, sputum production, shortness of breath and wheezing.   Cardiovascular: Negative for chest pain, palpitations, orthopnea and claudication.  Gastrointestinal: Negative for abdominal pain, blood in stool, constipation, diarrhea, heartburn, melena, nausea and vomiting.  Genitourinary: Negative for dysuria, flank pain, frequency, hematuria and urgency.  Musculoskeletal: Negative for back pain, joint pain and myalgias.  Skin: Negative for rash.  Neurological: Negative for dizziness, tingling, focal weakness, seizures, weakness and headaches.  Endo/Heme/Allergies: Does not bruise/bleed easily.  Psychiatric/Behavioral: Negative for depression and suicidal ideas. The patient does not have insomnia.      No Known Allergies   Past Medical History:  Diagnosis Date  . Cancer [facility])    Bladder  . Dysrhythmia    Atrial fib  . GERD (gastroesophageal reflux disease)   .  Hyperlipidemia   . Hypertension   . Paroxysmal atrial fibrillation (HCC)   . Pre-diabetes    diet controlled  . Sexual dysfunction      Past Surgical History:  Procedure Laterality Date  . APPENDECTOMY    . COLONOSCOPY WITH PROPOFOL N/A 10/25/2015   Procedure: COLONOSCOPY WITH PROPOFOL;  Surgeon: Manya Silvas, MD;  Location: [facility] ENDOSCOPY;  Service: Endoscopy;  Laterality: N/A;  . CYSTOSCOPY WITH INJECTION N/A 09/28/2017   Procedure: CYSTOSCOPY WITH INJECTION OF INDOCYANINE GREEN DYE;  Surgeon: Alexis Frock, MD;  Location: [facility] ORS;  Service: Urology;  Laterality: N/A;  . IR FLUORO GUIDE PORT INSERTION RIGHT  04/27/2017  . TRANSURETHRAL RESECTION OF BLADDER TUMOR N/A 04/02/2017   Procedure: TRANSURETHRAL RESECTION OF BLADDER TUMOR (TURBT);  Surgeon: Hollice Espy, MD;  Location: [facility] ORS;  Service: Urology;  Laterality: N/A;    Social History   Socioeconomic History  . Marital status: Married    Spouse name: Not on file  . Number of children: Not on file  . Years of education: Not on file  . Highest education level: Not on file  Occupational History  . Not on file  Social Needs  . Financial resource strain: Not on file  . Food insecurity:    Worry: Not on file    Inability: Not on file  . Transportation needs:    Medical: Not on file    Non-medical: Not on file  Tobacco Use  . Smoking status: Former Smoker    Packs/day: 1.00    Types: Cigarettes    Last attempt to quit: 10/21/1997    Years since quitting: 20.8  . Smokeless tobacco: Never Used  Substance and Sexual Activity  .  Alcohol use: Yes    Comment: 1-2 glasses of wine daily  . Drug use: No  . Sexual activity: Not on file  Lifestyle  . Physical activity:    Days per week: Not on file    Minutes per session: Not on file  . Stress: Not on file  Relationships  . Social connections:    Talks on phone: Not on file    Gets together: Not on file    Attends religious service: Not on file    Active member  of club or organization: Not on file    Attends meetings of clubs or organizations: Not on file    Relationship status: Not on file  . Intimate partner violence:    Fear of current or ex partner: Not on file    Emotionally abused: Not on file    Physically abused: Not on file    Forced sexual activity: Not on file  Other Topics Concern  . Not on file  Social History Narrative  . Not on file    Family History  Problem Relation Age of Onset  . Prostate cancer Father   . Stroke Father   . Atrial fibrillation Father   . Diabetes type II Father   . Kidney cancer Neg Hx      Current Outpatient Medications:  .  apixaban (ELIQUIS) 5 MG TABS tablet, Take 5 mg by mouth 2 (two) times daily., Disp: , Rfl:  .  docusate sodium (COLACE) 100 MG capsule, Take 100 mg by mouth daily., Disp: , Rfl:  .  hydrochlorothiazide (HYDRODIURIL) 25 MG tablet, Take 25 mg by mouth daily., Disp: , Rfl:  .  metoprolol succinate (TOPROL-XL) 50 MG 24 hr tablet, Take 50 mg by mouth every evening. Take with or immediately following a meal. , Disp: , Rfl:  .  omeprazole (PRILOSEC) 20 MG capsule, Take 20 mg by mouth every morning. , Disp: , Rfl:  .  acetaminophen (TYLENOL) 325 MG tablet, Take 650 mg by mouth every 6 (six) hours as needed., Disp: , Rfl:  .  famotidine (PEPCID AC) 10 MG chewable tablet, Chew 10 mg by mouth daily as needed for heartburn., Disp: , Rfl:  .  fluticasone (FLONASE) 50 MCG/ACT nasal spray, Place 1 spray into both nostrils at bedtime as needed for allergies or rhinitis., Disp: , Rfl:  .  senna (SENOKOT) 8.6 MG TABS tablet, Take 2 tablets (17.2 mg total) by mouth at bedtime. (Patient not taking: Reported on 03/14/2018), Disp: 60 each, Rfl: 11  Physical exam:  Vitals:   08/13/18 1348  BP: 122/84  Pulse: 92  Resp: 18  Temp: 97.7 F (36.5 C)  TempSrc: Tympanic  SpO2: 97%  Weight: 220 lb 1.6 oz (99.8 kg)  Height: 6' (1.829 m)   Physical Exam  Constitutional: He is oriented to person,  place, and time. He appears well-developed and well-nourished.  HENT:  Head: Normocephalic and atraumatic.  Eyes: Pupils are equal, round, and reactive to light. EOM are normal.  Neck: Normal range of motion.  Cardiovascular: Normal rate, regular rhythm and normal heart sounds.  Pulmonary/Chest: Effort normal and breath sounds normal.  Abdominal: Soft. Bowel sounds are normal.  External bladder pouch draining clear urine.  Neurological: He is alert and oriented to person, place, and time.  Skin: Skin is warm and dry.     CMP Latest Ref Rng & Units 08/13/2018  Glucose 70 - 99 mg/dL 130(H)  BUN 8 - 23 mg/dL 24(H)  Creatinine  0.61 - 1.24 mg/dL 1.37(H)  Sodium 135 - 145 mmol/L 137  Potassium 3.5 - 5.1 mmol/L 4.0  Chloride 98 - 111 mmol/L 101  CO2 22 - 32 mmol/L 29  Calcium 8.9 - 10.3 mg/dL 9.3  Total Protein 6.5 - 8.1 g/dL 7.1  Total Bilirubin 0.3 - 1.2 mg/dL 1.0  Alkaline Phos 38 - 126 U/L 35(L)  AST 15 - 41 U/L 42(H)  ALT 0 - 44 U/L 53(H)   CBC Latest Ref Rng & Units 08/13/2018  WBC 3.8 - 10.6 K/uL 5.8  Hemoglobin 13.0 - 18.0 g/dL 13.8  Hematocrit 40.0 - 52.0 % 40.5  Platelets 150 - 440 K/uL 184     Assessment and plan- Patient is a 72 y.o. male muscle invasive bladder cancer Stage II cT2N0cM0 s/p5cycles of neoadjuvant chemotherapy with gemcitabine/ cisplatinfollowed by laparoscopy-assisted cystoprostatectomy (ypT1 N0)  Clinically patient is doing well and there is no evidence of recurrence on today's exam.  He also follows up with [name] in [place] and had a CT chest abdomen and pelvis done in April 2019 did not reveal any evidence of recurrence.  He should ideally be getting CT scans every 3 to 6 months for the first 2 years and I have suggested that he should get in touch with their office before she sees [name] in November 2019.  I also reviewed MRI abdomen pelvis images independently and discussed findings with the patient.  Patient was noted to have pancreatic  cyst on prior scans which has remained stable.  Plan is to repeat MRI in 1 year and I will schedule for one in April 2019.  I will see him back in 6 months with a CBC and a CMP.  We will obtain the results of CT scans done in April from [place].  He continues to have his port in place which is being last every 6 to 8 months.  We will plan to take the port out at the two-year mark.   Visit Diagnosis 1. Encounter for follow-up surveillance of bladder cancer      Dr. Randa Evens, MD, MPH [facility] [phone] 08/15/2018 8:37 AM

## 2018-08-16 ENCOUNTER — Telehealth: Payer: Self-pay | Admitting: *Deleted

## 2018-08-16 NOTE — Telephone Encounter (Signed)
-----   Message from Sindy Guadeloupe, MD sent at 08/13/2018  2:13 PM EDT ----- Please let patient and pcp know that his kidney functions show creatinine of 1.3 about the same as 6 months back which needs to be monitored. His lfts are mildly worse which needs to be monitored as well. He had fatty liver on mri. Thanks, Astrid Divine

## 2018-08-16 NOTE — Telephone Encounter (Signed)
Faxed labs with MD note attached about labs to Dr. Netty Starring office and asked for intervention and observation for the creat and lft's.

## 2018-10-04 ENCOUNTER — Other Ambulatory Visit: Payer: Self-pay

## 2018-10-04 DIAGNOSIS — C679 Malignant neoplasm of bladder, unspecified: Secondary | ICD-10-CM

## 2018-10-09 ENCOUNTER — Inpatient Hospital Stay: Payer: Medicare Other | Attending: Oncology

## 2018-10-09 DIAGNOSIS — C679 Malignant neoplasm of bladder, unspecified: Secondary | ICD-10-CM

## 2018-10-09 DIAGNOSIS — Z8551 Personal history of malignant neoplasm of bladder: Secondary | ICD-10-CM | POA: Insufficient documentation

## 2018-10-09 LAB — CBC WITH DIFFERENTIAL/PLATELET
ABS IMMATURE GRANULOCYTES: 0.03 10*3/uL (ref 0.00–0.07)
Basophils Absolute: 0 10*3/uL (ref 0.0–0.1)
Basophils Relative: 1 %
EOS ABS: 0.1 10*3/uL (ref 0.0–0.5)
EOS PCT: 1 %
HEMATOCRIT: 40.3 % (ref 39.0–52.0)
HEMOGLOBIN: 13.3 g/dL (ref 13.0–17.0)
Immature Granulocytes: 1 %
LYMPHS ABS: 1.4 10*3/uL (ref 0.7–4.0)
Lymphocytes Relative: 26 %
MCH: 30.5 pg (ref 26.0–34.0)
MCHC: 33 g/dL (ref 30.0–36.0)
MCV: 92.4 fL (ref 80.0–100.0)
Monocytes Absolute: 0.6 10*3/uL (ref 0.1–1.0)
Monocytes Relative: 12 %
NRBC: 0 % (ref 0.0–0.2)
Neutro Abs: 3.1 10*3/uL (ref 1.7–7.7)
Neutrophils Relative %: 59 %
Platelets: 182 10*3/uL (ref 150–400)
RBC: 4.36 MIL/uL (ref 4.22–5.81)
RDW: 12.8 % (ref 11.5–15.5)
WBC: 5.3 10*3/uL (ref 4.0–10.5)

## 2018-10-09 LAB — COMPREHENSIVE METABOLIC PANEL
ALK PHOS: 32 U/L — AB (ref 38–126)
ALT: 34 U/L (ref 0–44)
AST: 25 U/L (ref 15–41)
Albumin: 4 g/dL (ref 3.5–5.0)
Anion gap: 6 (ref 5–15)
BILIRUBIN TOTAL: 1.1 mg/dL (ref 0.3–1.2)
BUN: 28 mg/dL — ABNORMAL HIGH (ref 8–23)
CALCIUM: 8.9 mg/dL (ref 8.9–10.3)
CO2: 25 mmol/L (ref 22–32)
CREATININE: 1.29 mg/dL — AB (ref 0.61–1.24)
Chloride: 104 mmol/L (ref 98–111)
GFR calc non Af Amer: 55 mL/min — ABNORMAL LOW (ref >60)
Glucose, Bld: 195 mg/dL — ABNORMAL HIGH (ref 70–99)
Potassium: 3.9 mmol/L (ref 3.5–5.1)
SODIUM: 135 mmol/L (ref 135–145)
TOTAL PROTEIN: 7.2 g/dL (ref 6.5–8.1)

## 2018-10-09 MED ORDER — HEPARIN SOD (PORK) LOCK FLUSH 100 UNIT/ML IV SOLN
500.0000 [IU] | Freq: Once | INTRAVENOUS | Status: AC
  Administered 2018-10-09: 500 [IU] via INTRAVENOUS

## 2018-10-09 MED ORDER — SODIUM CHLORIDE 0.9% FLUSH
10.0000 mL | Freq: Once | INTRAVENOUS | Status: AC
  Administered 2018-10-09: 10 mL via INTRAVENOUS
  Filled 2018-10-09: qty 10

## 2018-10-21 ENCOUNTER — Ambulatory Visit (HOSPITAL_COMMUNITY)
Admission: RE | Admit: 2018-10-21 | Discharge: 2018-10-21 | Disposition: A | Discharge status: Home / Self Care | Payer: Medicare Other | Source: Ambulatory Visit | Attending: Urology | Admitting: Urology

## 2018-10-21 ENCOUNTER — Other Ambulatory Visit: Payer: Self-pay | Admitting: Urology

## 2018-10-21 DIAGNOSIS — C670 Malignant neoplasm of trigone of bladder: Secondary | ICD-10-CM | POA: Diagnosis not present

## 2018-10-21 DIAGNOSIS — I700 Atherosclerosis of aorta: Secondary | ICD-10-CM | POA: Insufficient documentation

## 2018-11-29 ENCOUNTER — Inpatient Hospital Stay: Payer: Medicare Other | Attending: Oncology

## 2018-11-29 DIAGNOSIS — Z8551 Personal history of malignant neoplasm of bladder: Secondary | ICD-10-CM | POA: Diagnosis present

## 2018-11-29 DIAGNOSIS — Z452 Encounter for adjustment and management of vascular access device: Secondary | ICD-10-CM | POA: Diagnosis present

## 2018-11-29 DIAGNOSIS — Z95828 Presence of other vascular implants and grafts: Secondary | ICD-10-CM

## 2018-11-29 MED ORDER — SODIUM CHLORIDE 0.9% FLUSH
10.0000 mL | Freq: Once | INTRAVENOUS | Status: AC
  Administered 2018-11-29: 10 mL via INTRAVENOUS
  Filled 2018-11-29: qty 10

## 2018-11-29 MED ORDER — HEPARIN SOD (PORK) LOCK FLUSH 100 UNIT/ML IV SOLN
500.0000 [IU] | Freq: Once | INTRAVENOUS | Status: AC
  Administered 2018-11-29: 500 [IU] via INTRAVENOUS
  Filled 2018-11-29: qty 5

## 2019-01-03 ENCOUNTER — Other Ambulatory Visit: Payer: Self-pay | Admitting: Family Medicine

## 2019-01-03 DIAGNOSIS — N644 Mastodynia: Secondary | ICD-10-CM

## 2019-01-09 ENCOUNTER — Ambulatory Visit
Admission: RE | Admit: 2019-01-09 | Discharge: 2019-01-09 | Disposition: A | Discharge status: Home / Self Care | Payer: Medicare Other | Source: Ambulatory Visit | Attending: Family Medicine | Admitting: Family Medicine

## 2019-01-09 DIAGNOSIS — N644 Mastodynia: Secondary | ICD-10-CM

## 2019-01-09 HISTORY — DX: Personal history of antineoplastic chemotherapy: Z92.21

## 2019-01-17 ENCOUNTER — Other Ambulatory Visit: Payer: Self-pay

## 2019-01-17 ENCOUNTER — Inpatient Hospital Stay (HOSPITAL_BASED_OUTPATIENT_CLINIC_OR_DEPARTMENT_OTHER): Payer: Medicare Other | Admitting: Oncology

## 2019-01-17 ENCOUNTER — Inpatient Hospital Stay: Payer: Medicare Other | Attending: Oncology

## 2019-01-17 VITALS — BP 132/84 | HR 97 | Temp 98.0°F | Ht 72.0 in | Wt 221.5 lb

## 2019-01-17 DIAGNOSIS — Z9221 Personal history of antineoplastic chemotherapy: Secondary | ICD-10-CM

## 2019-01-17 DIAGNOSIS — Z87891 Personal history of nicotine dependence: Secondary | ICD-10-CM

## 2019-01-17 DIAGNOSIS — Z08 Encounter for follow-up examination after completed treatment for malignant neoplasm: Secondary | ICD-10-CM

## 2019-01-17 DIAGNOSIS — Z8551 Personal history of malignant neoplasm of bladder: Secondary | ICD-10-CM

## 2019-01-17 DIAGNOSIS — K862 Cyst of pancreas: Secondary | ICD-10-CM | POA: Insufficient documentation

## 2019-01-17 DIAGNOSIS — I10 Essential (primary) hypertension: Secondary | ICD-10-CM | POA: Insufficient documentation

## 2019-01-17 DIAGNOSIS — C679 Malignant neoplasm of bladder, unspecified: Secondary | ICD-10-CM

## 2019-01-17 LAB — COMPREHENSIVE METABOLIC PANEL
ALT: 36 U/L (ref 0–44)
AST: 27 U/L (ref 15–41)
Albumin: 4.3 g/dL (ref 3.5–5.0)
Alkaline Phosphatase: 39 U/L (ref 38–126)
Anion gap: 8 (ref 5–15)
BUN: 25 mg/dL — ABNORMAL HIGH (ref 8–23)
CALCIUM: 9.1 mg/dL (ref 8.9–10.3)
CO2: 28 mmol/L (ref 22–32)
Chloride: 104 mmol/L (ref 98–111)
Creatinine, Ser: 1.27 mg/dL — ABNORMAL HIGH (ref 0.61–1.24)
GFR calc Af Amer: >60 mL/min (ref >60)
GFR calc non Af Amer: 57 mL/min — ABNORMAL LOW (ref >60)
Glucose, Bld: 93 mg/dL (ref 70–99)
Potassium: 3.9 mmol/L (ref 3.5–5.1)
Sodium: 140 mmol/L (ref 135–145)
Total Bilirubin: 0.9 mg/dL (ref 0.3–1.2)
Total Protein: 7.6 g/dL (ref 6.5–8.1)

## 2019-01-17 LAB — CBC WITH DIFFERENTIAL/PLATELET
Abs Immature Granulocytes: 0.02 10*3/uL (ref 0.00–0.07)
Basophils Absolute: 0 10*3/uL (ref 0.0–0.1)
Basophils Relative: 0 %
Eosinophils Absolute: 0.1 10*3/uL (ref 0.0–0.5)
Eosinophils Relative: 1 %
HCT: 42.1 % (ref 39.0–52.0)
Hemoglobin: 13.8 g/dL (ref 13.0–17.0)
Immature Granulocytes: 0 %
Lymphocytes Relative: 33 %
Lymphs Abs: 2.4 10*3/uL (ref 0.7–4.0)
MCH: 30.1 pg (ref 26.0–34.0)
MCHC: 32.8 g/dL (ref 30.0–36.0)
MCV: 91.7 fL (ref 80.0–100.0)
MONOS PCT: 11 %
Monocytes Absolute: 0.8 10*3/uL (ref 0.1–1.0)
NEUTROS PCT: 55 %
Neutro Abs: 4 10*3/uL (ref 1.7–7.7)
Platelets: 214 10*3/uL (ref 150–400)
RBC: 4.59 MIL/uL (ref 4.22–5.81)
RDW: 13.4 % (ref 11.5–15.5)
WBC: 7.4 10*3/uL (ref 4.0–10.5)
nRBC: 0 % (ref 0.0–0.2)

## 2019-01-17 MED ORDER — HEPARIN SOD (PORK) LOCK FLUSH 100 UNIT/ML IV SOLN
500.0000 [IU] | Freq: Once | INTRAVENOUS | Status: AC
  Administered 2019-01-17: 500 [IU] via INTRAVENOUS

## 2019-01-17 MED ORDER — SODIUM CHLORIDE 0.9% FLUSH
10.0000 mL | Freq: Once | INTRAVENOUS | Status: AC
  Administered 2019-01-17: 10 mL via INTRAVENOUS
  Filled 2019-01-17: qty 10

## 2019-01-17 MED ORDER — HEPARIN SOD (PORK) LOCK FLUSH 100 UNIT/ML IV SOLN
INTRAVENOUS | Status: AC
  Filled 2019-01-17: qty 5

## 2019-01-20 ENCOUNTER — Encounter: Payer: Self-pay | Admitting: Oncology

## 2019-01-20 NOTE — Progress Notes (Signed)
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 Patient is a 73 year old male with a past medical history significant for atrial fibrillation for which he was on Eliquis in the past. In August 2017 patient had minor trauma to his abdomen while bone pain and then left hematuria or immediately following that which lasted for about a couple of days. Patient thought that this was likely secondary to trauma . However continued to have intermittent hematuria since then and then had a second major episode of bleeding in March 2018. He spoke to his primary care doctor who suggested to stop Eliquis at this time. However bleeding continued for 2-3 days after stopping Eliquis and hence he was referred to urology [name]  2. Ct abdomen on 03/09/17 showed: IMPRESSION: 1. 5.6 cm polypoid mass arises from the posterior right bladder wall, consistent with urothelial neoplasm. 2. 2 cm exophytic hypoattenuating lesion identified along the uncinate process of the pancreas. Dedicated abdominal MRI without and with contrast may prove helpful to further evaluate.  3. MRI abdomen on 03/30/17 showed: . 1.8 cm simple appearing cystic lesion exophytic from the uncinate process of the pancreas is assisted with other scattered tiny pancreatic parenchymal cyst. Repeat MRI in  12 months is recommended. This recommendation follows ACR consensus guidelines: Management of Incidental Pancreatic Cysts: A White Paper of the ACR Incidental Findings Committee. J Am Coll Radiol 6440;34:742-595. 2. 1 cm enhancing focus identified in the anterior aspect of the T12 vertebral body. Imaging features are not suggestive of cavernous hemangioma. Given the history of bladder cancer, metastatic involvement cannot be excluded. Bone scan may prove helpful to further evaluate.  4. Bone scan on 04/10/17 showed: IMPRESSION: 1. No abnormality is seen in the region of T12 to suggest a bone metastasis when compared to the prior MRI images. 2. Negative total body bone scan other than probable degenerative change of the facet joints of the lower lumbar spine.  5. CXr was negative for metastatic disease. Patient underwent TURBT on 04/02/17 which showed: DIAGNOSIS:  A. BLADDER TUMOR; TURBT:  - PAPILLARY UROTHELIAL CARCINOMA, INVADING MUSCULARIS PROPRIA,  HIGH-GRADE (WHO/ISUP).  6. Case was discussed at Tumor board and patient was thought to have atleast T2 cN0 muscle invasive bladder cancer. He has started neoadjuvant gem/cis chemotherapy. Since his counts did not hold up for D15 gemzar, plan is to give 2 weeks on 1 week of gem and split dose cisplatin. He has received 4 cycles of gem/cis 2 weeks on and 1 week off so far wich he has tolerated very well without significant side effects  7. Scans after 4 cycles of hemotherapy showed: 1. Large intraluminal bladder lesions seen on the prior CT scan is no longer evident. No findings to suggest metastatic disease in the abdomen or pelvis on today's exam. 2. Stable hepatic cysts and other smaller liver lesions, too small to characterize, but likely cysts. 3. No change 1.8 cm cystic lesion in the uncinate process of the  pancreas. Continued attention on follow-up imaging recommended. 4. Stable 4 cm simple cyst upper pole left kidney. 5. Stable  appearance of the focal sclerotic lesion T12 vertebral body. 6. Aortic Atherosclerois (ICD10-170.0)  8. Patient completed 5 cycles of gem/cis chemotherapy  9.Patient underwent laparoscopic radical cystoprostatectomy with bilateral pelvic lymphadenectomy and I ileal conduit diversion with cystoscopy on 09/28/2017. Pathology showed ypT1 a N0 M0. 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 10 lymph nodes were examined and were negative for malignancy.    Interval history-overall he is doing well.  His appetite is good and he denies any unintentional weight loss.  Denies any abdominal pain or hematuria.  He continues to follow-up with [name].  He did undergo recent scans as well as cystoscopy which was negative for recurrence0  ECOG PS- 0 Pain scale- 0   Review of systems- Review of Systems  Constitutional: Negative for chills, fever, malaise/fatigue and weight loss.  HENT: Negative for congestion, ear discharge and nosebleeds.   Eyes: Negative for blurred vision.  Respiratory: Negative for cough, hemoptysis, sputum production, shortness of breath and wheezing.   Cardiovascular: Negative for chest pain, palpitations, orthopnea and claudication.  Gastrointestinal: Negative for abdominal pain, blood in stool, constipation, diarrhea, heartburn, melena, nausea and vomiting.  Genitourinary: Negative for dysuria, flank pain, frequency, hematuria and urgency.  Musculoskeletal: Negative for back pain, joint pain and myalgias.  Skin: Negative for rash.  Neurological: Negative for dizziness, tingling, focal weakness, seizures, weakness and headaches.  Endo/Heme/Allergies: Does not bruise/bleed easily.  Psychiatric/Behavioral: Negative for depression and suicidal ideas. The patient does not have insomnia.       No Known Allergies   Past Medical History:  Diagnosis Date  . Cancer [facility])    Bladder  . Dysrhythmia    Atrial fib  . GERD  (gastroesophageal reflux disease)   . Hyperlipidemia   . Hypertension   . Paroxysmal atrial fibrillation (HCC)   . Personal history of chemotherapy   . Pre-diabetes    diet controlled  . Sexual dysfunction      Past Surgical History:  Procedure Laterality Date  . APPENDECTOMY    . COLONOSCOPY WITH PROPOFOL N/A 10/25/2015   Procedure: COLONOSCOPY WITH PROPOFOL;  Surgeon: Manya Silvas, MD;  Location: [facility] ENDOSCOPY;  Service: Endoscopy;  Laterality: N/A;  . CYSTOSCOPY WITH INJECTION N/A 09/28/2017   Procedure: CYSTOSCOPY WITH INJECTION OF INDOCYANINE GREEN DYE;  Surgeon: Alexis Frock, MD;  Location: [facility] ORS;  Service: Urology;  Laterality: N/A;  . IR FLUORO GUIDE PORT INSERTION RIGHT  04/27/2017  . TRANSURETHRAL RESECTION OF BLADDER TUMOR N/A 04/02/2017   Procedure: TRANSURETHRAL RESECTION OF BLADDER TUMOR (TURBT);  Surgeon: Hollice Espy, MD;  Location: [facility] ORS;  Service: Urology;  Laterality: N/A;    Social History   Socioeconomic History  . Marital status: Married    Spouse name: Not on file  . Number of children: Not on file  . Years of education: Not on file  . Highest education level: Not on file  Occupational History  . Not on file  Social Needs  . Financial resource strain: Not on file  . Food insecurity:    Worry: Not on file    Inability: Not on file  . Transportation needs:    Medical: Not on file    Non-medical: Not on file  Tobacco Use  . Smoking status: Former Smoker    Packs/day: 1.00    Types: Cigarettes    Last attempt to quit:  10/21/1997    Years since quitting: 21.2  . Smokeless tobacco: Never Used  Substance and Sexual Activity  . Alcohol use: Yes    Comment: 1-2 glasses of wine daily  . Drug use: No  . Sexual activity: Not on file  Lifestyle  . Physical activity:    Days per week: Not on file    Minutes per session: Not on file  . Stress: Not on file  Relationships  . Social connections:    Talks on phone: Not on file    Gets  together: Not on file    Attends religious service: Not on file    Active member of club or organization: Not on file    Attends meetings of clubs or organizations: Not on file    Relationship status: Not on file  . Intimate partner violence:    Fear of current or ex partner: Not on file    Emotionally abused: Not on file    Physically abused: Not on file    Forced sexual activity: Not on file  Other Topics Concern  . Not on file  Social History Narrative  . Not on file    Family History  Problem Relation Age of Onset  . Prostate cancer Father   . Stroke Father   . Atrial fibrillation Father   . Diabetes type II Father   . Kidney cancer Neg Hx      Current Outpatient Medications:  .  apixaban (ELIQUIS) 5 MG TABS tablet, Take 5 mg by mouth 2 (two) times daily., Disp: , Rfl:  .  docusate sodium (COLACE) 100 MG capsule, Take 100 mg by mouth daily as needed. , Disp: , Rfl:  .  famotidine (PEPCID AC) 10 MG chewable tablet, Chew 10 mg by mouth daily as needed for heartburn., Disp: , Rfl:  .  fluticasone (FLONASE) 50 MCG/ACT nasal spray, Place 1 spray into both nostrils at bedtime as needed for allergies or rhinitis., Disp: , Rfl:  .  glimepiride (AMARYL) 1 MG tablet, Take 1 tablet by mouth., Disp: , Rfl:  .  losartan (COZAAR) 50 MG tablet, Take 1 tablet by mouth., Disp: , Rfl:  .  lovastatin (MEVACOR) 20 MG tablet, Take 1 tablet by mouth at bedtime., Disp: , Rfl:  .  metoprolol succinate (TOPROL-XL) 50 MG 24 hr tablet, Take 50 mg by mouth every evening. Take with or immediately following a meal. , Disp: , Rfl:   Physical exam:  Vitals:   01/17/19 1510  BP: 132/84  Pulse: 97  Temp: 98 F (36.7 C)  TempSrc: Tympanic  Weight: 221 lb 8 oz (100.5 kg)  Height: 6' (1.829 m)   Physical Exam Constitutional:      General: He is not in acute distress. HENT:     Head: Normocephalic and atraumatic.  Eyes:     Pupils: Pupils are equal, round, and reactive to light.  Neck:      Musculoskeletal: Normal range of motion.  Cardiovascular:     Rate and Rhythm: Normal rate and regular rhythm.     Heart sounds: Normal heart sounds.  Pulmonary:     Effort: Pulmonary effort is normal.     Breath sounds: Normal breath sounds.  Abdominal:     General: Bowel sounds are normal.     Palpations: Abdomen is soft.     Comments: Ostomy bag in place from neobladder  Skin:    General: Skin is warm and dry.  Neurological:  Mental Status: He is alert and oriented to person, place, and time.      CMP Latest Ref Rng & Units 01/17/2019  Glucose 70 - 99 mg/dL 93  BUN 8 - 23 mg/dL 25(H)  Creatinine 0.61 - 1.24 mg/dL 1.27(H)  Sodium 135 - 145 mmol/L 140  Potassium 3.5 - 5.1 mmol/L 3.9  Chloride 98 - 111 mmol/L 104  CO2 22 - 32 mmol/L 28  Calcium 8.9 - 10.3 mg/dL 9.1  Total Protein 6.5 - 8.1 g/dL 7.6  Total Bilirubin 0.3 - 1.2 mg/dL 0.9  Alkaline Phos 38 - 126 U/L 39  AST 15 - 41 U/L 27  ALT 0 - 44 U/L 36   CBC Latest Ref Rng & Units 01/17/2019  WBC 4.0 - 10.5 K/uL 7.4  Hemoglobin 13.0 - 17.0 g/dL 13.8  Hematocrit 39.0 - 52.0 % 42.1  Platelets 150 - 400 K/uL 214    No images are attached to the encounter.  Mm Diag Breast Tomo Bilateral  Result Date: 01/09/2019 CLINICAL DATA:  73 year old male with intermittent left breast tenderness for approximately 2 months. No palpable lumps. EXAM: DIGITAL DIAGNOSTIC BILATERAL MAMMOGRAM WITH CAD AND TOMO COMPARISON:  Previous exam(s). ACR Breast Density Category a: The breast tissue is almost entirely fatty. FINDINGS: Flame shaped fibroglandular tissue is seen arising from the left nipple and extending posteriorly. This is consistent with moderate left-sided gynecomastia. No additional suspicious findings are identified in either breast. Mammographic images were processed with CAD. IMPRESSION: Moderate left-sided gynecomastia corresponding with the patient's clinical symptoms. RECOMMENDATION: I discussed with the patient the fact that  gynecomastia can occur in older men as testosterone levels decrease with age or in younger men with low testosterone levels, causing a change in the serum testosterone:estrogen ratio. We also discussed other potential etiologies of gynecomastia including numerous prescription medications and recreational drugs (marijuana and anabolic steroids in particular). I counseled the patient to perform self-examination to make sure that a hard lump does not form which could indicate malignancy and would need further evaluation. We also discussed the possibility of surgical excision if symptoms continue and if an etiology of the gynecomastia cannot be determined and therefore corrected. I have discussed the findings and recommendations with the patient. Results were also provided in writing at the conclusion of the visit. If applicable, a reminder letter will be sent to the patient regarding the next appointment. BI-RADS CATEGORY  2: Benign. Electronically Signed   By: Kristopher Oppenheim M.D.   On: 01/09/2019 09:38     Assessment and plan- Patient is a 73 y.o. male muscle invasive bladder cancer Stage II cT2N0cM0 s/p5cycles of neoadjuvant chemotherapy with gemcitabine/ cisplatinfollowed by laparoscopy-assisted cystoprostatectomy (ypT1 N0).  He is here for routine surveillance visit with his bladder cancer  Clinically patient is doing well and there is no evidence of recurrence on today's exam.  I will obtain his recent cystoscopy and CT scan reports from Dr. Zettie Pho visit as well.  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  I will see him back in 6 months with a CBC and CMP    Visit Diagnosis 1. Malignant neoplasm of urinary bladder, unspecified site ([place])   2. Encounter for follow-up surveillance of bladder cancer      Dr. Randa Evens, MD, MPH [facility] [phone] 01/20/2019 11:46 AM

## 2019-04-04 ENCOUNTER — Telehealth: Payer: Self-pay | Admitting: *Deleted

## 2019-04-04 NOTE — Telephone Encounter (Signed)
Patient called to request recommendations regarding MRI that is scheduled for 4/24 and port flush that is needed. Patients port was flushed last on 1/30 and has port flush and follow up scheduled in July at this time. If patient is to keep appointment for MRI, can we schedule him for port flush the same day if possible?

## 2019-04-07 NOTE — Telephone Encounter (Signed)
I spoke to wife and patient because he could hear well over the phone. He is agreeable for the port flush because it is exactly 12 weeks from last flush on 4/24. Dr. Janese Banks says she is fine with him if he wants to get scan 4/24 or wait a couple of months because it is not considered urgent during this pandemic. Wife spoke to patient and wife and patient would like to move it out 2 months til late June. I will change it and call them with new date. Also the patients flush appt was accidentally made for [place] so I have changed it to [place].

## 2019-04-10 ENCOUNTER — Other Ambulatory Visit: Payer: Self-pay

## 2019-04-11 ENCOUNTER — Other Ambulatory Visit: Payer: Self-pay

## 2019-04-11 ENCOUNTER — Ambulatory Visit: Payer: Medicare Other

## 2019-04-11 ENCOUNTER — Inpatient Hospital Stay: Payer: Medicare Other | Attending: Oncology

## 2019-04-11 DIAGNOSIS — Z8551 Personal history of malignant neoplasm of bladder: Secondary | ICD-10-CM | POA: Insufficient documentation

## 2019-04-11 DIAGNOSIS — Z452 Encounter for adjustment and management of vascular access device: Secondary | ICD-10-CM | POA: Insufficient documentation

## 2019-04-11 DIAGNOSIS — C679 Malignant neoplasm of bladder, unspecified: Secondary | ICD-10-CM

## 2019-04-11 MED ORDER — HEPARIN SOD (PORK) LOCK FLUSH 100 UNIT/ML IV SOLN
500.0000 [IU] | Freq: Once | INTRAVENOUS | Status: AC
  Administered 2019-04-11: 500 [IU] via INTRAVENOUS
  Filled 2019-04-11: qty 5

## 2019-04-11 MED ORDER — SODIUM CHLORIDE 0.9% FLUSH
10.0000 mL | Freq: Once | INTRAVENOUS | Status: AC
  Administered 2019-04-11: 10 mL via INTRAVENOUS
  Filled 2019-04-11: qty 10

## 2019-06-10 ENCOUNTER — Other Ambulatory Visit: Payer: Self-pay

## 2019-06-10 ENCOUNTER — Ambulatory Visit (HOSPITAL_COMMUNITY)
Admission: RE | Admit: 2019-06-10 | Discharge: 2019-06-10 | Disposition: A | Discharge status: Home / Self Care | Payer: Medicare Other | Source: Ambulatory Visit | Attending: Urology | Admitting: Urology

## 2019-06-10 ENCOUNTER — Other Ambulatory Visit (HOSPITAL_COMMUNITY): Payer: Self-pay | Admitting: Urology

## 2019-06-10 DIAGNOSIS — C670 Malignant neoplasm of trigone of bladder: Secondary | ICD-10-CM | POA: Insufficient documentation

## 2019-06-11 ENCOUNTER — Ambulatory Visit
Admission: RE | Admit: 2019-06-11 | Discharge: 2019-06-11 | Disposition: A | Discharge status: Home / Self Care | Payer: Medicare Other | Source: Ambulatory Visit | Attending: Oncology | Admitting: Oncology

## 2019-06-11 DIAGNOSIS — Z08 Encounter for follow-up examination after completed treatment for malignant neoplasm: Secondary | ICD-10-CM | POA: Diagnosis present

## 2019-06-11 DIAGNOSIS — Z8551 Personal history of malignant neoplasm of bladder: Secondary | ICD-10-CM | POA: Diagnosis present

## 2019-06-11 LAB — POCT I-STAT CREATININE: Creatinine, Ser: 1.2 mg/dL (ref 0.61–1.24)

## 2019-06-11 MED ORDER — GADOBUTROL 1 MMOL/ML IV SOLN
10.0000 mL | Freq: Once | INTRAVENOUS | Status: AC | PRN
  Administered 2019-06-11: 10 mL via INTRAVENOUS

## 2019-07-10 ENCOUNTER — Other Ambulatory Visit: Payer: Self-pay

## 2019-07-11 ENCOUNTER — Inpatient Hospital Stay: Payer: Medicare Other | Admitting: Oncology

## 2019-07-11 ENCOUNTER — Inpatient Hospital Stay: Payer: Medicare Other

## 2019-07-11 ENCOUNTER — Other Ambulatory Visit: Payer: Self-pay

## 2019-07-11 ENCOUNTER — Inpatient Hospital Stay: Payer: Medicare Other | Attending: Oncology | Admitting: *Deleted

## 2019-07-11 ENCOUNTER — Inpatient Hospital Stay (HOSPITAL_BASED_OUTPATIENT_CLINIC_OR_DEPARTMENT_OTHER): Payer: Medicare Other | Admitting: Oncology

## 2019-07-11 ENCOUNTER — Encounter: Payer: Self-pay | Admitting: Oncology

## 2019-07-11 VITALS — BP 104/72 | HR 70 | Temp 98.1°F | Resp 16 | Ht 72.0 in | Wt 215.4 lb

## 2019-07-11 DIAGNOSIS — Z8551 Personal history of malignant neoplasm of bladder: Secondary | ICD-10-CM | POA: Diagnosis not present

## 2019-07-11 DIAGNOSIS — Z9221 Personal history of antineoplastic chemotherapy: Secondary | ICD-10-CM | POA: Insufficient documentation

## 2019-07-11 DIAGNOSIS — C679 Malignant neoplasm of bladder, unspecified: Secondary | ICD-10-CM

## 2019-07-11 DIAGNOSIS — Z95828 Presence of other vascular implants and grafts: Secondary | ICD-10-CM

## 2019-07-11 DIAGNOSIS — K862 Cyst of pancreas: Secondary | ICD-10-CM | POA: Diagnosis present

## 2019-07-11 DIAGNOSIS — Z08 Encounter for follow-up examination after completed treatment for malignant neoplasm: Secondary | ICD-10-CM

## 2019-07-11 DIAGNOSIS — I4891 Unspecified atrial fibrillation: Secondary | ICD-10-CM | POA: Diagnosis not present

## 2019-07-11 DIAGNOSIS — Z87891 Personal history of nicotine dependence: Secondary | ICD-10-CM | POA: Diagnosis not present

## 2019-07-11 DIAGNOSIS — Z8042 Family history of malignant neoplasm of prostate: Secondary | ICD-10-CM | POA: Insufficient documentation

## 2019-07-11 LAB — CBC WITH DIFFERENTIAL/PLATELET
Abs Immature Granulocytes: 0.02 10*3/uL (ref 0.00–0.07)
Basophils Absolute: 0 10*3/uL (ref 0.0–0.1)
Basophils Relative: 1 %
Eosinophils Absolute: 0.1 10*3/uL (ref 0.0–0.5)
Eosinophils Relative: 1 %
HCT: 41.8 % (ref 39.0–52.0)
Hemoglobin: 13.8 g/dL (ref 13.0–17.0)
Immature Granulocytes: 0 %
Lymphocytes Relative: 38 %
Lymphs Abs: 2.1 10*3/uL (ref 0.7–4.0)
MCH: 30.6 pg (ref 26.0–34.0)
MCHC: 33 g/dL (ref 30.0–36.0)
MCV: 92.7 fL (ref 80.0–100.0)
Monocytes Absolute: 0.6 10*3/uL (ref 0.1–1.0)
Monocytes Relative: 11 %
Neutro Abs: 2.7 10*3/uL (ref 1.7–7.7)
Neutrophils Relative %: 49 %
Platelets: 170 10*3/uL (ref 150–400)
RBC: 4.51 MIL/uL (ref 4.22–5.81)
RDW: 13 % (ref 11.5–15.5)
WBC: 5.5 10*3/uL (ref 4.0–10.5)
nRBC: 0 % (ref 0.0–0.2)

## 2019-07-11 LAB — COMPREHENSIVE METABOLIC PANEL
ALT: 35 U/L (ref 0–44)
AST: 24 U/L (ref 15–41)
Albumin: 4.3 g/dL (ref 3.5–5.0)
Alkaline Phosphatase: 35 U/L — ABNORMAL LOW (ref 38–126)
Anion gap: 7 (ref 5–15)
BUN: 22 mg/dL (ref 8–23)
CO2: 26 mmol/L (ref 22–32)
Calcium: 9 mg/dL (ref 8.9–10.3)
Chloride: 106 mmol/L (ref 98–111)
Creatinine, Ser: 1.15 mg/dL (ref 0.61–1.24)
GFR calc Af Amer: >60 mL/min (ref >60)
GFR calc non Af Amer: >60 mL/min (ref >60)
Glucose, Bld: 101 mg/dL — ABNORMAL HIGH (ref 70–99)
Potassium: 4.4 mmol/L (ref 3.5–5.1)
Sodium: 139 mmol/L (ref 135–145)
Total Bilirubin: 1.1 mg/dL (ref 0.3–1.2)
Total Protein: 7.3 g/dL (ref 6.5–8.1)

## 2019-07-11 MED ORDER — SODIUM CHLORIDE 0.9% FLUSH
10.0000 mL | Freq: Once | INTRAVENOUS | Status: AC
  Administered 2019-07-11: 10 mL via INTRAVENOUS
  Filled 2019-07-11: qty 10

## 2019-07-11 MED ORDER — HEPARIN SOD (PORK) LOCK FLUSH 100 UNIT/ML IV SOLN
500.0000 [IU] | Freq: Once | INTRAVENOUS | Status: AC
  Administered 2019-07-11: 09:00:00 500 [IU] via INTRAVENOUS

## 2019-07-11 NOTE — Progress Notes (Signed)
Pt feeling great, trying soups with veg. And some meat. Stopped drinking wine a couple of days a week. He now feels better, more energy. No bladder issues-drinking plenty of water and urine is clear

## 2019-07-11 NOTE — Progress Notes (Signed)
Hematology/Oncology Consult note [facility]  Telephone:([phone] [phone]  Patient Care Team: Dion Body, MD as PCP - General (Family Medicine) Hollice Espy, MD as Consulting Physician (Urology) Sindy Guadeloupe, MD as Consulting Physician (Oncology) Alexis Frock, MD as Consulting Physician (Urology)   Name of the patient: Randy Wheeler  [id number]  Mar 06, 1946   Date of visit: 07/11/19  Diagnosis- 1. 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  Pancreatic cyst  Chief complaint/ Reason for visit-routine follow-up of bladder cancer and pancreatic cyst  Heme/Onc history:  1. Patient is a 73 year old male with a past medical history significant for atrial fibrillation for which he was on Eliquis in the past. In August 2017 patient had minor trauma to his abdomen while bone pain and then left hematuria or immediately following that which lasted for about a couple of days. Patient thought that this was likely secondary to trauma . However continued to have intermittent hematuria since then and then had a second major episode of bleeding in March 2018. He spoke to his primary care doctor who suggested to stop Eliquis at this time. However bleeding continued for 2-3 days after stopping Eliquis and hence he was referred to urology [name]  2. Ct abdomen on 03/09/17 showed: IMPRESSION: 1. 5.6 cm polypoid mass arises from the posterior right bladder wall, consistent with urothelial neoplasm. 2. 2 cm exophytic hypoattenuating lesion identified along the uncinate process of the pancreas. Dedicated abdominal MRI without and with contrast may prove helpful to further evaluate.  3. MRI abdomen on 03/30/17 showed: . 1.8 cm simple appearing cystic lesion exophytic from the uncinate process of the pancreas is assisted with other scattered tiny  pancreatic parenchymal cyst. Repeat MRI in 12 months is recommended. This recommendation follows ACR consensus guidelines: Management of Incidental Pancreatic Cysts: A White Paper of the ACR Incidental Findings Committee. J Am Coll Radiol 5498;26:415-830. 2. 1 cm enhancing focus identified in the anterior aspect of the T12 vertebral body. Imaging features are not suggestive of cavernous hemangioma. Given the history of bladder cancer, metastatic involvement cannot be excluded. Bone scan may prove helpful to further evaluate.  4. Bone scan on 04/10/17 showed: IMPRESSION: 1. No abnormality is seen in the region of T12 to suggest a bone metastasis when compared to the prior MRI images. 2. Negative total body bone scan other than probable degenerative change of the facet joints of the lower lumbar spine.  5. CXr was negative for metastatic disease. Patient underwent TURBT on 04/02/17 which showed: DIAGNOSIS:  A. BLADDER TUMOR; TURBT:  - PAPILLARY UROTHELIAL CARCINOMA, INVADING MUSCULARIS PROPRIA,  HIGH-GRADE (WHO/ISUP).  6. Case was discussed at Tumor board and patient was thought to have atleast T2 cN0 muscle invasive bladder cancer. He has started neoadjuvant gem/cis chemotherapy. Since his counts did not hold up for D15 gemzar, plan is to give 2 weeks on 1 week of gem and split dose cisplatin. He has received 4 cycles of gem/cis 2 weeks on and 1 week off so far wich he has tolerated very well without significant side effects  7. Scans after 4 cycles of hemotherapy showed: 1. Large intraluminal bladder lesions seen on the prior CT scan is no longer evident. No findings to suggest metastatic disease in the abdomen or pelvis on today's exam. 2. Stable hepatic cysts and other smaller liver lesions, too small to characterize, but likely cysts. 3. No change 1.8 cm  cystic lesion in the uncinate process of the pancreas. Continued attention on follow-up imaging recommended. 4. Stable 4 cm  simple cyst upper pole left kidney. 5. Stable appearance of the focal sclerotic lesion T12 vertebral body. 6. Aortic Atherosclerois (ICD10-170.0)  8. Patient completed 5 cycles of gem/cis chemotherapy  9.Patient underwent laparoscopic radical cystoprostatectomy with bilateral pelvic lymphadenectomy and I ileal conduit diversion with cystoscopy on 09/28/2017. Pathology showed ypT1 a N0 M0. 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 10 lymph nodes were examined and were negative for malignancy.   Interval history- doing well. He is working on losing some weight. Denies any complaints at this time. Appetite is good. Denies any pain or hematuria  ECOG PS- 0 Pain scale- 0   Review of systems- Review of Systems  Constitutional: Negative for chills, fever, malaise/fatigue and weight loss.  HENT: Negative for congestion, ear discharge and nosebleeds.   Eyes: Negative for blurred vision.  Respiratory: Negative for cough, hemoptysis, sputum production, shortness of breath and wheezing.   Cardiovascular: Negative for chest pain, palpitations, orthopnea and claudication.  Gastrointestinal: Negative for abdominal pain, blood in stool, constipation, diarrhea, heartburn, melena, nausea and vomiting.  Genitourinary: Negative for dysuria, flank pain, frequency, hematuria and urgency.  Musculoskeletal: Negative for back pain, joint pain and myalgias.  Skin: Negative for rash.  Neurological: Negative for dizziness, tingling, focal weakness, seizures, weakness and headaches.  Endo/Heme/Allergies: Does not bruise/bleed easily.  Psychiatric/Behavioral: Negative for depression and suicidal ideas. The patient does not have insomnia.       No Known Allergies   Past Medical History:  Diagnosis Date  . Cancer [facility])    Bladder  . Dysrhythmia    Atrial fib  . GERD (gastroesophageal reflux disease)   . Hyperlipidemia   . Hypertension   . Paroxysmal  atrial fibrillation (HCC)   . Personal history of chemotherapy   . Pre-diabetes    diet controlled  . Sexual dysfunction      Past Surgical History:  Procedure Laterality Date  . APPENDECTOMY    . COLONOSCOPY WITH PROPOFOL N/A 10/25/2015   Procedure: COLONOSCOPY WITH PROPOFOL;  Surgeon: Manya Silvas, MD;  Location: [facility] ENDOSCOPY;  Service: Endoscopy;  Laterality: N/A;  . CYSTOSCOPY WITH INJECTION N/A 09/28/2017   Procedure: CYSTOSCOPY WITH INJECTION OF INDOCYANINE GREEN DYE;  Surgeon: Alexis Frock, MD;  Location: [facility] ORS;  Service: Urology;  Laterality: N/A;  . IR FLUORO GUIDE PORT INSERTION RIGHT  04/27/2017  . TRANSURETHRAL RESECTION OF BLADDER TUMOR N/A 04/02/2017   Procedure: TRANSURETHRAL RESECTION OF BLADDER TUMOR (TURBT);  Surgeon: Hollice Espy, MD;  Location: [facility] ORS;  Service: Urology;  Laterality: N/A;    Social History   Socioeconomic History  . Marital status: Married    Spouse name: Not on file  . Number of children: Not on file  . Years of education: Not on file  . Highest education level: Not on file  Occupational History  . Not on file  Social Needs  . Financial resource strain: Not on file  . Food insecurity    Worry: Not on file    Inability: Not on file  . Transportation needs    Medical: Not on file    Non-medical: Not on file  Tobacco Use  . Smoking status: Former Smoker    Packs/day: 1.00    Types: Cigarettes    Quit date: 10/21/1997    Years since quitting: 21.7  . Smokeless tobacco: Never Used  Substance and Sexual  Activity  . Alcohol use: Yes    Comment: 1-2 glasses of wine daily  . Drug use: No  . Sexual activity: Not on file  Lifestyle  . Physical activity    Days per week: Not on file    Minutes per session: Not on file  . Stress: Not on file  Relationships  . Social Herbalist on phone: Not on file    Gets together: Not on file    Attends religious service: Not on file    Active member of club or organization:  Not on file    Attends meetings of clubs or organizations: Not on file    Relationship status: Not on file  . Intimate partner violence    Fear of current or ex partner: Not on file    Emotionally abused: Not on file    Physically abused: Not on file    Forced sexual activity: Not on file  Other Topics Concern  . Not on file  Social History Narrative  . Not on file    Family History  Problem Relation Age of Onset  . Prostate cancer Father   . Stroke Father   . Atrial fibrillation Father   . Diabetes type II Father   . Kidney cancer Neg Hx      Current Outpatient Medications:  .  apixaban (ELIQUIS) 5 MG TABS tablet, Take 5 mg by mouth 2 (two) times daily., Disp: , Rfl:  .  fluticasone (FLONASE) 50 MCG/ACT nasal spray, Place 1 spray into both nostrils at bedtime as needed for allergies or rhinitis., Disp: , Rfl:  .  glimepiride (AMARYL) 1 MG tablet, Take 1 tablet by mouth., Disp: , Rfl:  .  losartan (COZAAR) 50 MG tablet, Take 1 tablet by mouth., Disp: , Rfl:  .  lovastatin (MEVACOR) 20 MG tablet, Take 1 tablet by mouth at bedtime., Disp: , Rfl:  .  metoprolol succinate (TOPROL-XL) 50 MG 24 hr tablet, Take 50 mg by mouth every evening. Take with or immediately following a meal. , Disp: , Rfl:  .  omeprazole (PRILOSEC) 20 MG capsule, Take 20 mg by mouth daily as needed., Disp: , Rfl:  .  famotidine (PEPCID AC) 10 MG chewable tablet, Chew 10 mg by mouth daily as needed for heartburn., Disp: , Rfl:   Physical exam:  Vitals:   07/11/19 0930  BP: 104/72  Pulse: 70  Resp: 16  Temp: 98.1 F (36.7 C)  TempSrc: Tympanic  Weight: 215 lb 6.4 oz (97.7 kg)  Height: 6' (1.829 m)   Physical Exam HENT:     Head: Normocephalic and atraumatic.  Eyes:     Pupils: Pupils are equal, round, and reactive to light.  Neck:     Musculoskeletal: Normal range of motion.  Cardiovascular:     Rate and Rhythm: Normal rate and regular rhythm.     Heart sounds: Normal heart sounds.  Pulmonary:      Effort: Pulmonary effort is normal.     Breath sounds: Normal breath sounds.  Abdominal:     General: Bowel sounds are normal.     Palpations: Abdomen is soft.  Skin:    General: Skin is warm and dry.  Neurological:     Mental Status: He is alert and oriented to person, place, and time.      CMP Latest Ref Rng & Units 06/11/2019  Glucose 70 - 99 mg/dL -  BUN 8 - 23 mg/dL -  Creatinine 0.61 -  1.24 mg/dL 1.20  Sodium 135 - 145 mmol/L -  Potassium 3.5 - 5.1 mmol/L -  Chloride 98 - 111 mmol/L -  CO2 22 - 32 mmol/L -  Calcium 8.9 - 10.3 mg/dL -  Total Protein 6.5 - 8.1 g/dL -  Total Bilirubin 0.3 - 1.2 mg/dL -  Alkaline Phos 38 - 126 U/L -  AST 15 - 41 U/L -  ALT 0 - 44 U/L -   CBC Latest Ref Rng & Units 07/11/2019  WBC 4.0 - 10.5 K/uL 5.5  Hemoglobin 13.0 - 17.0 g/dL 13.8  Hematocrit 39.0 - 52.0 % 41.8  Platelets 150 - 400 K/uL 170    No images are attached to the encounter.  Mr Abdomen W Wo Contrast  Result Date: 06/11/2019 CLINICAL DATA:  Pancreatic cyst.  History of bladder cancer. EXAM: MRI ABDOMEN WITHOUT AND WITH CONTRAST TECHNIQUE: Multiplanar multisequence MR imaging of the abdomen was performed both before and after the administration of intravenous contrast. CONTRAST:  10 cc Gadavist COMPARISON:  CT scan 11/05/2018.  MRI 04/09/2018. FINDINGS: Lower chest: Unremarkable. Hepatobiliary: Loss of signal intensity in the liver parenchyma on out of phase T1 imaging is compatible with fatty deposition. Multiple hepatic cysts of varying size are identified. Dominant cyst is in the lateral segment left liver measuring 2.1 cm. There is no evidence for gallstones, gallbladder wall thickening, or pericholecystic fluid. No intrahepatic or extrahepatic biliary dilation. Pancreas: Multiple tiny cystic foci are seen scattered through the pancreatic parenchyma. Cluster of cysts noted inferior pancreatic head and uncinate process. Dominant cyst in the uncinate process measures 1.9 x 1.1 cm  today (axial T2 image 19/series 6). This lesion was 1.8 x 1.1 cm when I remeasure at the same level previously. Imaging after IV contrast administration shows no suspicious mural nodularity or enhancing internal architecture. The additional scattered tiny cysts measure up to about 5 mm. Spleen:  No splenomegaly. No focal mass lesion. Adrenals/Urinary Tract: No adrenal nodule or mass. Right kidney unremarkable. Similar appearance 3.5 cm simple cyst upper pole left kidney. No hydronephrosis. Stomach/Bowel: Stomach is unremarkable. No gastric wall thickening. No evidence of outlet obstruction. Duodenum is normally positioned as is the ligament of Treitz. No small bowel or colonic dilatation within the visualized abdomen. Vascular/Lymphatic: No abdominal aortic aneurysm. No abdominal lymphadenopathy. Other:  No intraperitoneal free fluid. Musculoskeletal: No suspicious marrow enhancement within the visualized bony anatomy. IMPRESSION: 1. Stable cysts scattered in the pancreatic parenchyma. Dominant cyst again noted in the uncinate process without substantial interval change. This is been stable since prior study and also comparing back to 03/30/2017. Given the 2 years of imaging stability, consensus recommendations are for continued annual surveillance for 2 additional years. Follow-up MRI in 12 months recommended. This recommendation follows ACR consensus guidelines: Management of Incidental Pancreatic Cysts: A White Paper of the ACR Incidental Findings Committee. [place] 0737;10:626-948. 2. Hepatic and left renal cysts. 3. Hepatic steatosis. Electronically Signed   By: Misty Stanley M.D.   On: 06/11/2019 15:44     Assessment and plan- Patient is a 73 y.o. male muscle invasive bladder cancer Stage II cT2N0cM0 s/p5cycles of neoadjuvant chemotherapy with gemcitabine/ cisplatinfollowed by laparoscopy-assisted cystoprostatectomy (ypT1 N0).    He is here for the following issues:  1.  Clinically patient  is doing well and no concerns of recurrence based on signs and symptoms.  We will obtain recent imaging studies from urology for his bladder cancer surveillance.  He did undergo MRI abdomen on 06/11/2019  for surveillance of this pancreatic cyst and that did not show any evidence of recurrence of bladder cancer.  2.  Pancreatic cyst: Stable over 2 years.  He would need yearly MRIs for 2 more years to ensure stability  I will see him back in 6 months time with a CBC with differential, CMP.  We will get his port out at this time    Visit Diagnosis 1. Pancreatic cyst   2. Encounter for follow-up surveillance of bladder cancer      Dr. Randa Evens, MD, MPH [facility] [phone] 07/11/2019 9:33 AM

## 2019-07-17 ENCOUNTER — Other Ambulatory Visit: Payer: Self-pay | Admitting: *Deleted

## 2019-07-18 ENCOUNTER — Ambulatory Visit: Payer: Medicare Other | Admitting: Oncology

## 2019-07-18 ENCOUNTER — Other Ambulatory Visit: Payer: Self-pay | Admitting: *Deleted

## 2019-07-18 ENCOUNTER — Other Ambulatory Visit: Payer: Medicare Other

## 2019-07-18 DIAGNOSIS — C674 Malignant neoplasm of posterior wall of bladder: Secondary | ICD-10-CM

## 2019-07-22 ENCOUNTER — Telehealth: Payer: Self-pay | Admitting: *Deleted

## 2019-07-22 ENCOUNTER — Encounter: Payer: Self-pay | Admitting: Oncology

## 2019-07-22 NOTE — Telephone Encounter (Signed)
I got a call from [place] in specials that pt has been scheduled for port removal on 8/14 at 7:30 arrival and 8:30 procedure. Patient has to be off eliquis for 48 hours before the procedure. I have contacted Dr. Saralyn Pilar office and asked for a letter to take off eliquis for 2 days prior to having the port removed. Will await fax from their office

## 2019-07-29 ENCOUNTER — Other Ambulatory Visit: Payer: Self-pay

## 2019-07-29 ENCOUNTER — Other Ambulatory Visit
Admission: RE | Admit: 2019-07-29 | Discharge: 2019-07-29 | Disposition: A | Discharge status: Home / Self Care | Payer: Medicare Other | Source: Ambulatory Visit | Attending: Oncology | Admitting: Oncology

## 2019-07-29 DIAGNOSIS — Z20828 Contact with and (suspected) exposure to other viral communicable diseases: Secondary | ICD-10-CM | POA: Diagnosis not present

## 2019-07-29 DIAGNOSIS — Z01812 Encounter for preprocedural laboratory examination: Secondary | ICD-10-CM | POA: Diagnosis not present

## 2019-07-29 LAB — SARS CORONAVIRUS 2 (TAT 6-24 HRS): SARS Coronavirus 2: NEGATIVE

## 2019-07-31 ENCOUNTER — Other Ambulatory Visit: Payer: Self-pay | Admitting: Radiology

## 2019-08-01 ENCOUNTER — Other Ambulatory Visit: Payer: Self-pay

## 2019-08-01 ENCOUNTER — Ambulatory Visit
Admission: RE | Admit: 2019-08-01 | Discharge: 2019-08-01 | Disposition: A | Discharge status: Home / Self Care | Payer: Medicare Other | Source: Ambulatory Visit | Attending: Oncology | Admitting: Oncology

## 2019-08-01 DIAGNOSIS — I10 Essential (primary) hypertension: Secondary | ICD-10-CM | POA: Diagnosis not present

## 2019-08-01 DIAGNOSIS — K219 Gastro-esophageal reflux disease without esophagitis: Secondary | ICD-10-CM | POA: Diagnosis not present

## 2019-08-01 DIAGNOSIS — E785 Hyperlipidemia, unspecified: Secondary | ICD-10-CM | POA: Insufficient documentation

## 2019-08-01 DIAGNOSIS — R7303 Prediabetes: Secondary | ICD-10-CM | POA: Diagnosis not present

## 2019-08-01 DIAGNOSIS — Z79899 Other long term (current) drug therapy: Secondary | ICD-10-CM | POA: Insufficient documentation

## 2019-08-01 DIAGNOSIS — Z823 Family history of stroke: Secondary | ICD-10-CM | POA: Insufficient documentation

## 2019-08-01 DIAGNOSIS — Z452 Encounter for adjustment and management of vascular access device: Secondary | ICD-10-CM | POA: Insufficient documentation

## 2019-08-01 DIAGNOSIS — Z7901 Long term (current) use of anticoagulants: Secondary | ICD-10-CM | POA: Diagnosis not present

## 2019-08-01 DIAGNOSIS — I480 Paroxysmal atrial fibrillation: Secondary | ICD-10-CM | POA: Insufficient documentation

## 2019-08-01 DIAGNOSIS — Z87891 Personal history of nicotine dependence: Secondary | ICD-10-CM | POA: Insufficient documentation

## 2019-08-01 DIAGNOSIS — Z8249 Family history of ischemic heart disease and other diseases of the circulatory system: Secondary | ICD-10-CM | POA: Insufficient documentation

## 2019-08-01 DIAGNOSIS — C679 Malignant neoplasm of bladder, unspecified: Secondary | ICD-10-CM | POA: Insufficient documentation

## 2019-08-01 DIAGNOSIS — C674 Malignant neoplasm of posterior wall of bladder: Secondary | ICD-10-CM

## 2019-08-01 DIAGNOSIS — Z833 Family history of diabetes mellitus: Secondary | ICD-10-CM | POA: Insufficient documentation

## 2019-08-01 DIAGNOSIS — Z8042 Family history of malignant neoplasm of prostate: Secondary | ICD-10-CM | POA: Insufficient documentation

## 2019-08-01 HISTORY — PX: IR REMOVAL TUN ACCESS W/ PORT W/O FL MOD SED: IMG2290

## 2019-08-01 LAB — BASIC METABOLIC PANEL
Anion gap: 9 (ref 5–15)
BUN: 25 mg/dL — ABNORMAL HIGH (ref 8–23)
CO2: 27 mmol/L (ref 22–32)
Calcium: 9.5 mg/dL (ref 8.9–10.3)
Chloride: 104 mmol/L (ref 98–111)
Creatinine, Ser: 1.11 mg/dL (ref 0.61–1.24)
GFR calc Af Amer: >60 mL/min (ref >60)
GFR calc non Af Amer: >60 mL/min (ref >60)
Glucose, Bld: 118 mg/dL — ABNORMAL HIGH (ref 70–99)
Potassium: 4.3 mmol/L (ref 3.5–5.1)
Sodium: 140 mmol/L (ref 135–145)

## 2019-08-01 LAB — GLUCOSE, CAPILLARY: Glucose-Capillary: 104 mg/dL — ABNORMAL HIGH (ref 70–99)

## 2019-08-01 LAB — CBC
HCT: 45.1 % (ref 39.0–52.0)
Hemoglobin: 14.7 g/dL (ref 13.0–17.0)
MCH: 31.1 pg (ref 26.0–34.0)
MCHC: 32.6 g/dL (ref 30.0–36.0)
MCV: 95.6 fL (ref 80.0–100.0)
Platelets: 228 10*3/uL (ref 150–400)
RBC: 4.72 MIL/uL (ref 4.22–5.81)
RDW: 13.2 % (ref 11.5–15.5)
WBC: 6.2 10*3/uL (ref 4.0–10.5)
nRBC: 0 % (ref 0.0–0.2)

## 2019-08-01 LAB — PROTIME-INR
INR: 1 (ref 0.8–1.2)
Prothrombin Time: 13.3 seconds (ref 11.4–15.2)

## 2019-08-01 MED ORDER — MIDAZOLAM HCL 5 MG/5ML IJ SOLN
INTRAMUSCULAR | Status: AC
  Filled 2019-08-01: qty 5

## 2019-08-01 MED ORDER — CEFAZOLIN SODIUM-DEXTROSE 2-4 GM/100ML-% IV SOLN
2.0000 g | INTRAVENOUS | Status: AC
  Administered 2019-08-01: 2 g via INTRAVENOUS

## 2019-08-01 MED ORDER — FENTANYL CITRATE (PF) 100 MCG/2ML IJ SOLN
INTRAMUSCULAR | Status: AC | PRN
  Administered 2019-08-01: 50 ug via INTRAVENOUS

## 2019-08-01 MED ORDER — LIDOCAINE-EPINEPHRINE (PF) 1 %-1:200000 IJ SOLN
INTRAMUSCULAR | Status: AC | PRN
  Administered 2019-08-01: 2 mL

## 2019-08-01 MED ORDER — SODIUM CHLORIDE 0.9 % IV SOLN
INTRAVENOUS | Status: DC
  Administered 2019-08-01: 08:00:00 via INTRAVENOUS

## 2019-08-01 MED ORDER — MIDAZOLAM HCL 5 MG/5ML IJ SOLN
INTRAMUSCULAR | Status: AC | PRN
  Administered 2019-08-01 (×2): 1 mg via INTRAVENOUS

## 2019-08-01 MED ORDER — CEFAZOLIN SODIUM-DEXTROSE 2-4 GM/100ML-% IV SOLN
INTRAVENOUS | Status: AC
  Filled 2019-08-01: qty 100

## 2019-08-01 MED ORDER — FENTANYL CITRATE (PF) 100 MCG/2ML IJ SOLN
INTRAMUSCULAR | Status: AC
  Filled 2019-08-01: qty 4

## 2019-08-01 NOTE — Discharge Instructions (Signed)
Moderate Conscious Sedation, Adult, Care After These instructions provide you with information about caring for yourself after your procedure. Your health care provider may also give you more specific instructions. Your treatment has been planned according to current medical practices, but problems sometimes occur. Call your health care provider if you have any problems or questions after your procedure. What can I expect after the procedure? After your procedure, it is common:  To feel sleepy for several hours.  To feel clumsy and have poor balance for several hours.  To have poor judgment for several hours.  To vomit if you eat too soon. Follow these instructions at home: For at least 24 hours after the procedure:   Do not: ? Participate in activities where you could fall or become injured. ? Drive. ? Use heavy machinery. ? Drink alcohol. ? Take sleeping pills or medicines that cause drowsiness. ? Make important decisions or sign legal documents. ? Take care of children on your own.  Rest. Eating and drinking  Follow the diet recommended by your health care provider.  If you vomit: ? Drink water, juice, or soup when you can drink without vomiting. ? Make sure you have little or no nausea before eating solid foods. General instructions  Have a responsible adult stay with you until you are awake and alert.  Take over-the-counter and prescription medicines only as told by your health care provider.  If you smoke, do not smoke without supervision.  Keep all follow-up visits as told by your health care provider. This is important. Contact a health care provider if:  You keep feeling nauseous or you keep vomiting.  You feel light-headed.  You develop a rash.  You have a fever. Get help right away if:  You have trouble breathing. This information is not intended to replace advice given to you by your health care provider. Make sure you discuss any questions you have  with your health care provider. Document Released: 09/24/2013 Document Revised: 11/16/2017 Document Reviewed: 03/25/2016 Elsevier Patient Education  [place], Adult An incision is a cut that a doctor makes in your skin for surgery (for a procedure). Most times, these cuts are closed after surgery. Your cut from surgery may be closed with stitches (sutures), staples, skin glue, or skin tape (adhesive strips). You may need to return to your doctor to have stitches or staples taken out. This may happen many days or many weeks after your surgery. The cut needs to be well cared for so it does not get infected. How to care for your cut Cut care   Follow instructions from your doctor about how to take care of your cut. Make sure you: ? Wash your hands with soap and water before you change your bandage (dressing). If you cannot use soap and water, use hand sanitizer. ? Change your bandage as told by your doctor. ? Leave stitches, skin glue, or skin tape in place. They may need to stay in place for 2 weeks or longer. If tape strips get loose and curl up, you may trim the loose edges. Do not remove tape strips completely unless your doctor says it is okay.  Check your cut area every day for signs of infection. Check for: ? More redness, swelling, or pain. ? More fluid or blood. ? Warmth. ? Pus or a bad smell.  Ask your doctor how to clean the cut. This may include: ? Using mild soap and water. ? Using a clean towel  to pat the cut dry after you clean it. ? Putting a cream or ointment on the cut. Do this only as told by your doctor. ? Covering the cut with a clean bandage.  Ask your doctor when you can leave the cut uncovered.  Do not take baths, swim, or use a hot tub until your doctor says it is okay. Ask your doctor if you can take showers. You may only be allowed to take sponge baths for bathing. Medicines  If you were prescribed an antibiotic medicine, cream, or  ointment, take the antibiotic or put it on the cut as told by your doctor. Do not stop taking or putting on the antibiotic even if your condition gets better.  Take over-the-counter and prescription medicines only as told by your doctor. General instructions  Limit movement around your cut. This helps healing. ? Avoid straining, lifting, or exercise for the first month, or for as long as told by your doctor. ? Follow instructions from your doctor about going back to your normal activities. ? Ask your doctor what activities are safe.  Protect your cut from the sun when you are outside for the first 6 months, or for as long as told by your doctor. Put on sunscreen around the scar or cover up the scar.  Keep all follow-up visits as told by your doctor. This is important. Contact a doctor if:  Your have more redness, swelling, or pain around the cut.  You have more fluid or blood coming from the cut.  Your cut feels warm to the touch.  You have pus or a bad smell coming from the cut.  You have a fever or shaking chills.  You feel sick to your stomach (nauseous) or you throw up (vomit).  You are dizzy.  Your stitches or staples come undone. Get help right away if:  You have a red streak coming from your cut.  Your cut bleeds through the bandage and the bleeding does not stop with gentle pressure.  The edges of your cut open up and separate.  You have very bad (severe) pain.  You have a rash.  You are confused.  You pass out (faint).  You have trouble breathing and you have a fast heartbeat. This information is not intended to replace advice given to you by your health care provider. Make sure you discuss any questions you have with your health care provider. Document Released: 02/26/2012 Document Revised: 04/23/2017 Document Reviewed: 08/11/2016 Elsevier Patient Education  2020 [employer].

## 2019-08-01 NOTE — Progress Notes (Signed)
Patient post port removal per DR [facility] well. Vitals stable. Wife to [place] discharge instructions given with questions answered. Denies complaints.

## 2019-08-01 NOTE — Procedures (Signed)
Interventional Radiology Procedure Note  Procedure: Port removal  Complications: None  Estimated Blood Loss: < 10 mL  Findings: Right chest port removed utilizing sharp and blunt dissection. Entire port and catheter removed. Wound closed.  Venetia Night. Kathlene Cote, M.D Pager:  [phone]

## 2019-08-05 ENCOUNTER — Other Ambulatory Visit: Payer: Medicare Other

## 2020-01-09 ENCOUNTER — Other Ambulatory Visit: Payer: Self-pay

## 2020-01-09 ENCOUNTER — Other Ambulatory Visit: Payer: Self-pay | Admitting: Oncology

## 2020-01-09 ENCOUNTER — Inpatient Hospital Stay: Payer: Medicare Other | Attending: Oncology

## 2020-01-09 DIAGNOSIS — Z8551 Personal history of malignant neoplasm of bladder: Secondary | ICD-10-CM | POA: Insufficient documentation

## 2020-01-09 DIAGNOSIS — C679 Malignant neoplasm of bladder, unspecified: Secondary | ICD-10-CM

## 2020-01-09 LAB — COMPREHENSIVE METABOLIC PANEL
ALT: 48 U/L — ABNORMAL HIGH (ref 0–44)
AST: 32 U/L (ref 15–41)
Albumin: 4.1 g/dL (ref 3.5–5.0)
Alkaline Phosphatase: 36 U/L — ABNORMAL LOW (ref 38–126)
Anion gap: 10 (ref 5–15)
BUN: 25 mg/dL — ABNORMAL HIGH (ref 8–23)
CO2: 26 mmol/L (ref 22–32)
Calcium: 9 mg/dL (ref 8.9–10.3)
Chloride: 102 mmol/L (ref 98–111)
Creatinine, Ser: 1.16 mg/dL (ref 0.61–1.24)
GFR calc Af Amer: >60 mL/min (ref >60)
GFR calc non Af Amer: >60 mL/min (ref >60)
Glucose, Bld: 117 mg/dL — ABNORMAL HIGH (ref 70–99)
Potassium: 4.3 mmol/L (ref 3.5–5.1)
Sodium: 138 mmol/L (ref 135–145)
Total Bilirubin: 1 mg/dL (ref 0.3–1.2)
Total Protein: 7.3 g/dL (ref 6.5–8.1)

## 2020-01-09 LAB — CBC WITH DIFFERENTIAL/PLATELET
Abs Immature Granulocytes: 0.03 10*3/uL (ref 0.00–0.07)
Basophils Absolute: 0 10*3/uL (ref 0.0–0.1)
Basophils Relative: 0 %
Eosinophils Absolute: 0.1 10*3/uL (ref 0.0–0.5)
Eosinophils Relative: 2 %
HCT: 43.8 % (ref 39.0–52.0)
Hemoglobin: 14.1 g/dL (ref 13.0–17.0)
Immature Granulocytes: 1 %
Lymphocytes Relative: 34 %
Lymphs Abs: 2.2 10*3/uL (ref 0.7–4.0)
MCH: 30.3 pg (ref 26.0–34.0)
MCHC: 32.2 g/dL (ref 30.0–36.0)
MCV: 94 fL (ref 80.0–100.0)
Monocytes Absolute: 0.8 10*3/uL (ref 0.1–1.0)
Monocytes Relative: 13 %
Neutro Abs: 3.3 10*3/uL (ref 1.7–7.7)
Neutrophils Relative %: 50 %
Platelets: 177 10*3/uL (ref 150–400)
RBC: 4.66 MIL/uL (ref 4.22–5.81)
RDW: 13.4 % (ref 11.5–15.5)
WBC: 6.4 10*3/uL (ref 4.0–10.5)
nRBC: 0 % (ref 0.0–0.2)

## 2020-01-12 ENCOUNTER — Inpatient Hospital Stay (HOSPITAL_BASED_OUTPATIENT_CLINIC_OR_DEPARTMENT_OTHER): Payer: Medicare Other | Admitting: Oncology

## 2020-01-12 ENCOUNTER — Other Ambulatory Visit: Payer: Self-pay

## 2020-01-12 ENCOUNTER — Ambulatory Visit: Payer: Medicare Other | Admitting: Oncology

## 2020-01-12 ENCOUNTER — Inpatient Hospital Stay: Payer: Medicare Other

## 2020-01-12 ENCOUNTER — Other Ambulatory Visit: Payer: Medicare Other

## 2020-01-12 ENCOUNTER — Encounter: Payer: Self-pay | Admitting: Oncology

## 2020-01-12 DIAGNOSIS — Z08 Encounter for follow-up examination after completed treatment for malignant neoplasm: Secondary | ICD-10-CM | POA: Diagnosis not present

## 2020-01-12 DIAGNOSIS — Z8551 Personal history of malignant neoplasm of bladder: Secondary | ICD-10-CM

## 2020-01-12 NOTE — Progress Notes (Signed)
I connected with Randy Wheeler on 01/12/20 at  9:30 AM EST by video enabled telemedicine visit and verified that I am speaking with the correct person using two identifiers.   I discussed the limitations, risks, security and privacy concerns of performing an evaluation and management service by telemedicine and the availability of in-person appointments. I also discussed with the patient that there may be a patient responsible charge related to this service. The patient expressed understanding and agreed to proceed.  Other persons participating in the visit and their role in the encounter:  Patients wife  Patient's location:  home Provider's location:  work  Risk analyst Complaint: Routine follow-up of bladder cancer  History of present illness:1. Patient is a 74 year old male with a past medical history significant for atrial fibrillation for which he was on Eliquis in the past. In August 2017 patient had minor trauma to his abdomen while bone pain and then left hematuria or immediately following that which lasted for about a couple of days. Patient thought that this was likely secondary to trauma . However continued to have intermittent hematuria since then and then had a second major episode of bleeding in March 2018. He spoke to his primary care doctor who suggested to stop Eliquis at this time. However bleeding continued for 2-3 days after stopping Eliquis and hence he was referred to urology [name]  2. Ct abdomen on 03/09/17 showed: IMPRESSION: 1. 5.6 cm polypoid mass arises from the posterior right bladder wall, consistent with urothelial neoplasm. 2. 2 cm exophytic hypoattenuating lesion identified along the uncinate process of the pancreas. Dedicated abdominal MRI without and with contrast may prove helpful to further evaluate.  3. MRI abdomen on 03/30/17 showed: . 1.8 cm simple appearing cystic lesion exophytic from the uncinate process of the pancreas is assisted with other scattered  tiny pancreatic parenchymal cyst. Repeat MRI in 12 months is recommended. This recommendation follows ACR consensus guidelines: Management of Incidental Pancreatic Cysts: A White Paper of the ACR Incidental Findings Committee. J Am Coll Radiol [id number]. 2. 1 cm enhancing focus identified in the anterior aspect of the T12 vertebral body. Imaging features are not suggestive of cavernous hemangioma. Given the history of bladder cancer, metastatic involvement cannot be excluded. Bone scan may prove helpful to further evaluate.  4. Bone scan on 04/10/17 showed: IMPRESSION: 1. No abnormality is seen in the region of T12 to suggest a bone metastasis when compared to the prior MRI images. 2. Negative total body bone scan other than probable degenerative change of the facet joints of the lower lumbar spine.  5. CXr was negative for metastatic disease. Patient underwent TURBT on 04/02/17 which showed: DIAGNOSIS:  A. BLADDER TUMOR; TURBT:  - PAPILLARY UROTHELIAL CARCINOMA, INVADING MUSCULARIS PROPRIA,  HIGH-GRADE (WHO/ISUP).  6. Case was discussed at Tumor board and patient was thought to have atleast T2 cN0 muscle invasive bladder cancer. He has started neoadjuvant gem/cis chemotherapy. Since his counts did not hold up for D15 gemzar, plan is to give 2 weeks on 1 week of gem and split dose cisplatin. He has received 4 cycles of gem/cis 2 weeks on and 1 week off so far wich he has tolerated very well without significant side effects  7. Scans after 4 cycles of hemotherapy showed: 1. Large intraluminal bladder lesions seen on the prior CT scan is no longer evident. No findings to suggest metastatic disease in the abdomen or pelvis on today's exam. 2. Stable hepatic cysts and other smaller liver lesions, too  small to characterize, but likely cysts. 3. No change 1.8 cm cystic lesion in the uncinate process of the pancreas. Continued attention on follow-up imaging recommended. 4. Stable 4 cm  simple cyst upper pole left kidney. 5. Stable appearance of the focal sclerotic lesion T12 vertebral body. 6. Aortic Atherosclerois (ICD10-170.0)  8. Patient completed 5 cycles of gem/cis chemotherapy  9.Patient underwent laparoscopic radical cystoprostatectomy with bilateral pelvic lymphadenectomy and I ileal conduit diversion with cystoscopy on 09/28/2017. Pathology showed ypT1 a N0 M0. 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 10 lymph nodes were examined and were negative for malignancy.   Interval history patient reports doing well and denies any complaints at this time.  He follows up with [name] once a year and his recent cystoscopy was unremarkable.  Also had a CT abdomen and pelvis with contrast with him which did not show any evidence of malignancy.   Review of Systems  Constitutional: Negative for chills, fever, malaise/fatigue and weight loss.  HENT: Negative for congestion, ear discharge and nosebleeds.   Eyes: Negative for blurred vision.  Respiratory: Negative for cough, hemoptysis, sputum production, shortness of breath and wheezing.   Cardiovascular: Negative for chest pain, palpitations, orthopnea and claudication.  Gastrointestinal: Negative for abdominal pain, blood in stool, constipation, diarrhea, heartburn, melena, nausea and vomiting.  Genitourinary: Negative for dysuria, flank pain, frequency, hematuria and urgency.  Musculoskeletal: Negative for back pain, joint pain and myalgias.  Skin: Negative for rash.  Neurological: Negative for dizziness, tingling, focal weakness, seizures, weakness and headaches.  Endo/Heme/Allergies: Does not bruise/bleed easily.  Psychiatric/Behavioral: Negative for depression and suicidal ideas. The patient does not have insomnia.     No Known Allergies  Past Medical History:  Diagnosis Date  . Cancer [facility])    Bladder  . Diabetes mellitus without complication ([place])   .  Dysrhythmia    Atrial fib  . GERD (gastroesophageal reflux disease)   . Hyperlipidemia   . Hypertension   . Paroxysmal atrial fibrillation (HCC)   . Personal history of chemotherapy   . Pre-diabetes    diet controlled  . Sexual dysfunction     Past Surgical History:  Procedure Laterality Date  . APPENDECTOMY    . COLONOSCOPY WITH PROPOFOL N/A 10/25/2015   Procedure: COLONOSCOPY WITH PROPOFOL;  Surgeon: Manya Silvas, MD;  Location: [facility] ENDOSCOPY;  Service: Endoscopy;  Laterality: N/A;  . CYSTOSCOPY WITH INJECTION N/A 09/28/2017   Procedure: CYSTOSCOPY WITH INJECTION OF INDOCYANINE GREEN DYE;  Surgeon: Alexis Frock, MD;  Location: [facility] ORS;  Service: Urology;  Laterality: N/A;  . ILEOSTOMY    . IR FLUORO GUIDE PORT INSERTION RIGHT  04/27/2017  . IR REMOVAL TUN ACCESS W/ PORT W/O FL MOD SED  08/01/2019  . TRANSURETHRAL RESECTION OF BLADDER TUMOR N/A 04/02/2017   Procedure: TRANSURETHRAL RESECTION OF BLADDER TUMOR (TURBT);  Surgeon: Hollice Espy, MD;  Location: [facility] ORS;  Service: Urology;  Laterality: N/A;    Social History   Socioeconomic History  . Marital status: Married    Spouse name: Margaretha Sheffield  . Number of children: 2  . Years of education: Not on file  . Highest education level: Not on file  Occupational History  . Not on file  Tobacco Use  . Smoking status: Former Smoker    Packs/day: 1.00    Types: Cigarettes    Quit date: 10/21/1997    Years since quitting: 22.2  . Smokeless tobacco: Never Used  Substance and Sexual Activity  .  Alcohol use: Not Currently    Comment: none in 3 months  . Drug use: No  . Sexual activity: Not on file  Other Topics Concern  . Not on file  Social History Narrative  . Not on file   Social Determinants of Health   Financial Resource Strain: Low Risk   . Difficulty of Paying Living Expenses: Not hard at all  Food Insecurity: No Food Insecurity  . Worried About Charity fundraiser in the Last Year: Never true  . Ran Out of Food  in the Last Year: Never true  Transportation Needs: No Transportation Needs  . Lack of Transportation (Medical): No  . Lack of Transportation (Non-Medical): No  Physical Activity:   . Days of Exercise per Week: Not on file  . Minutes of Exercise per Session: Not on file  Stress:   . Feeling of Stress : Not on file  Social Connections: Not Isolated  . Frequency of Communication with Friends and Family: More than three times a week  . Frequency of Social Gatherings with Friends and Family: More than three times a week  . Attends Religious Services: More than 4 times per year  . Active Member of Clubs or Organizations: Yes  . Attends Archivist Meetings: 1 to 4 times per year  . Marital Status: Married  Human resources officer Violence: Not At Risk  . Fear of Current or Ex-Partner: No  . Emotionally Abused: No  . Physically Abused: No  . Sexually Abused: No    Family History  Problem Relation Age of Onset  . Prostate cancer Father   . Stroke Father   . Atrial fibrillation Father   . Diabetes type II Father   . Kidney cancer Neg Hx      Current Outpatient Medications:  .  apixaban (ELIQUIS) 5 MG TABS tablet, Take 5 mg by mouth 2 (two) times daily., Disp: , Rfl:  .  glimepiride (AMARYL) 1 MG tablet, Take 1 tablet by mouth., Disp: , Rfl:  .  losartan (COZAAR) 50 MG tablet, Take 1 tablet by mouth., Disp: , Rfl:  .  lovastatin (MEVACOR) 20 MG tablet, Take 1 tablet by mouth at bedtime., Disp: , Rfl:  .  metoprolol succinate (TOPROL-XL) 50 MG 24 hr tablet, Take 50 mg by mouth every evening. Take with or immediately following a meal. , Disp: , Rfl:  .  acetaminophen (TYLENOL) 500 MG tablet, Take 500 mg by mouth every 6 (six) hours as needed., Disp: , Rfl:  .  famotidine (PEPCID AC) 10 MG chewable tablet, Chew 10 mg by mouth daily as needed for heartburn., Disp: , Rfl:  .  fluticasone (FLONASE) 50 MCG/ACT nasal spray, Place 1 spray into both nostrils at bedtime as needed for allergies  or rhinitis., Disp: , Rfl:  .  omeprazole (PRILOSEC) 20 MG capsule, Take 20 mg by mouth daily as needed., Disp: , Rfl:   No results found.  No images are attached to the encounter.   CMP Latest Ref Rng & Units 01/09/2020  Glucose 70 - 99 mg/dL 117(H)  BUN 8 - 23 mg/dL 25(H)  Creatinine 0.61 - 1.24 mg/dL 1.16  Sodium 135 - 145 mmol/L 138  Potassium 3.5 - 5.1 mmol/L 4.3  Chloride 98 - 111 mmol/L 102  CO2 22 - 32 mmol/L 26  Calcium 8.9 - 10.3 mg/dL 9.0  Total Protein 6.5 - 8.1 g/dL 7.3  Total Bilirubin 0.3 - 1.2 mg/dL 1.0  Alkaline Phos 38 - 126  U/L 36(L)  AST 15 - 41 U/L 32  ALT 0 - 44 U/L 48(H)   CBC Latest Ref Rng & Units 01/09/2020  WBC 4.0 - 10.5 K/uL 6.4  Hemoglobin 13.0 - 17.0 g/dL 14.1  Hematocrit 39.0 - 52.0 % 43.8  Platelets 150 - 400 K/uL 177     Observation/objective: Appears in no acute distress or video visit today.  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  Patient reports having a CT scan done with urology which was unremarkable.  He is now roughly 2 years out of his bladder cancer and I will plan to see him once a year alternating with urology.  Pancreatic cyst: Stable over 2 years.  Repeat MRI abdomen with and without contrast in June 2021 to ensure stability  Follow-up instructions: Return to clinic in 1 year with labs CBC with differential and CMP  I discussed the assessment and treatment plan with the patient. The patient was provided an opportunity to ask questions and all were answered. The patient agreed with the plan and demonstrated an understanding of the instructions.   The patient was advised to  call back or seek an in-person evaluation if the symptoms worsen or if the condition fails to improve as anticipated.   Visit Diagnosis: 1. Encounter for follow-up surveillance of bladder cancer     Dr. Randa Evens, MD, MPH [facility] at [facility] Tel- [id number] 01/12/2020 4:32 PM

## 2020-01-12 NOTE — Progress Notes (Signed)
Patient stated that he had been doing well with no concerns. 

## 2020-06-02 ENCOUNTER — Other Ambulatory Visit: Payer: Self-pay

## 2020-06-02 ENCOUNTER — Other Ambulatory Visit (HOSPITAL_COMMUNITY): Payer: Self-pay | Admitting: Urology

## 2020-06-02 ENCOUNTER — Ambulatory Visit (HOSPITAL_COMMUNITY)
Admission: RE | Admit: 2020-06-02 | Discharge: 2020-06-02 | Disposition: A | Discharge status: Home / Self Care | Payer: Medicare Other | Source: Ambulatory Visit | Attending: Urology | Admitting: Urology

## 2020-06-02 DIAGNOSIS — C670 Malignant neoplasm of trigone of bladder: Secondary | ICD-10-CM

## 2020-06-11 ENCOUNTER — Other Ambulatory Visit: Payer: Self-pay

## 2020-06-11 ENCOUNTER — Ambulatory Visit
Admission: RE | Admit: 2020-06-11 | Discharge: 2020-06-11 | Disposition: A | Discharge status: Home / Self Care | Payer: Medicare Other | Source: Ambulatory Visit | Attending: Oncology | Admitting: Oncology

## 2020-06-11 DIAGNOSIS — Z8551 Personal history of malignant neoplasm of bladder: Secondary | ICD-10-CM | POA: Diagnosis present

## 2020-06-11 DIAGNOSIS — Z08 Encounter for follow-up examination after completed treatment for malignant neoplasm: Secondary | ICD-10-CM | POA: Insufficient documentation

## 2020-06-11 MED ORDER — GADOBUTROL 1 MMOL/ML IV SOLN
10.0000 mL | Freq: Once | INTRAVENOUS | Status: AC | PRN
  Administered 2020-06-11: 10 mL via INTRAVENOUS

## 2021-01-11 ENCOUNTER — Inpatient Hospital Stay (HOSPITAL_BASED_OUTPATIENT_CLINIC_OR_DEPARTMENT_OTHER): Payer: Medicare Other | Admitting: Oncology

## 2021-01-11 ENCOUNTER — Encounter: Payer: Self-pay | Admitting: Oncology

## 2021-01-11 ENCOUNTER — Inpatient Hospital Stay: Payer: Medicare Other | Attending: Oncology

## 2021-01-11 VITALS — BP 140/105 | HR 89 | Temp 99.0°F | Resp 18 | Wt 212.4 lb

## 2021-01-11 DIAGNOSIS — Z8551 Personal history of malignant neoplasm of bladder: Secondary | ICD-10-CM | POA: Diagnosis not present

## 2021-01-11 DIAGNOSIS — Z87891 Personal history of nicotine dependence: Secondary | ICD-10-CM | POA: Insufficient documentation

## 2021-01-11 DIAGNOSIS — Z8042 Family history of malignant neoplasm of prostate: Secondary | ICD-10-CM | POA: Diagnosis not present

## 2021-01-11 DIAGNOSIS — K862 Cyst of pancreas: Secondary | ICD-10-CM | POA: Insufficient documentation

## 2021-01-11 DIAGNOSIS — Z08 Encounter for follow-up examination after completed treatment for malignant neoplasm: Secondary | ICD-10-CM

## 2021-01-11 DIAGNOSIS — Z9221 Personal history of antineoplastic chemotherapy: Secondary | ICD-10-CM | POA: Diagnosis not present

## 2021-01-11 LAB — CBC WITH DIFFERENTIAL/PLATELET
Abs Immature Granulocytes: 0.02 10*3/uL (ref 0.00–0.07)
Basophils Absolute: 0 10*3/uL (ref 0.0–0.1)
Basophils Relative: 0 %
Eosinophils Absolute: 0.1 10*3/uL (ref 0.0–0.5)
Eosinophils Relative: 2 %
HCT: 45.2 % (ref 39.0–52.0)
Hemoglobin: 15.1 g/dL (ref 13.0–17.0)
Immature Granulocytes: 0 %
Lymphocytes Relative: 29 %
Lymphs Abs: 1.8 10*3/uL (ref 0.7–4.0)
MCH: 30.9 pg (ref 26.0–34.0)
MCHC: 33.4 g/dL (ref 30.0–36.0)
MCV: 92.6 fL (ref 80.0–100.0)
Monocytes Absolute: 0.6 10*3/uL (ref 0.1–1.0)
Monocytes Relative: 10 %
Neutro Abs: 3.6 10*3/uL (ref 1.7–7.7)
Neutrophils Relative %: 59 %
Platelets: 174 10*3/uL (ref 150–400)
RBC: 4.88 MIL/uL (ref 4.22–5.81)
RDW: 13.7 % (ref 11.5–15.5)
WBC: 6.2 10*3/uL (ref 4.0–10.5)
nRBC: 0 % (ref 0.0–0.2)

## 2021-01-11 LAB — COMPREHENSIVE METABOLIC PANEL
ALT: 27 U/L (ref 0–44)
AST: 26 U/L (ref 15–41)
Albumin: 4.2 g/dL (ref 3.5–5.0)
Alkaline Phosphatase: 34 U/L — ABNORMAL LOW (ref 38–126)
Anion gap: 12 (ref 5–15)
BUN: 20 mg/dL (ref 8–23)
CO2: 30 mmol/L (ref 22–32)
Calcium: 9.4 mg/dL (ref 8.9–10.3)
Chloride: 98 mmol/L (ref 98–111)
Creatinine, Ser: 1.26 mg/dL — ABNORMAL HIGH (ref 0.61–1.24)
GFR, Estimated: >60 mL/min (ref >60)
Glucose, Bld: 111 mg/dL — ABNORMAL HIGH (ref 70–99)
Potassium: 4.2 mmol/L (ref 3.5–5.1)
Sodium: 140 mmol/L (ref 135–145)
Total Bilirubin: 0.9 mg/dL (ref 0.3–1.2)
Total Protein: 7.5 g/dL (ref 6.5–8.1)

## 2021-01-13 NOTE — Progress Notes (Signed)
Hematology/Oncology Consult note [facility]  [phone] [phone]  Patient Care Team: Dion Body, MD as PCP - General (Family Medicine) Hollice Espy, MD as Consulting Physician (Urology) Sindy Guadeloupe, MD as Consulting Physician (Oncology) Alexis Frock, MD as Consulting Physician (Urology)   Name of the patient: Randy Wheeler  [id number]  [birth year]   Date of visit: 01/13/21  Diagnosis- . 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  Pancreatic cyst  Chief complaint/ Reason for visit-routine follow-up of bladder cancer and pancreatic cyst  Heme/Onc history: 1. Patient is a 75 year old male with a past medical history significant for atrial fibrillation for which he was on Eliquis in the past. In August 2017 patient had minor trauma to his abdomen while bone pain and then left hematuria or immediately following that which lasted for about a couple of days. Patient thought that this was likely secondary to trauma . However continued to have intermittent hematuria since then and then had a second major episode of bleeding in March 2018. He spoke to his primary care doctor who suggested to stop Eliquis at this time. However bleeding continued for 2-3 days after stopping Eliquis and hence he was referred to urology [name]  2. Ct abdomen on 03/09/17 showed: IMPRESSION: 1. 5.6 cm polypoid mass arises from the posterior right bladder wall, consistent with urothelial neoplasm. 2. 2 cm exophytic hypoattenuating lesion identified along the uncinate process of the pancreas. Dedicated abdominal MRI without and with contrast may prove helpful to further evaluate.  3. MRI abdomen on 03/30/17 showed: . 1.8 cm simple appearing cystic lesion exophytic from the uncinate process of the pancreas is assisted with other scattered  tiny pancreatic parenchymal cyst. Repeat MRI in 12 months is recommended. This recommendation follows ACR consensus guidelines: Management of Incidental Pancreatic Cysts: A White Paper of the ACR Incidental Findings Committee. J Am Coll Radiol 5956;38:756-433. 2. 1 cm enhancing focus identified in the anterior aspect of the T12 vertebral body. Imaging features are not suggestive of cavernous hemangioma. Given the history of bladder cancer, metastatic involvement cannot be excluded. Bone scan may prove helpful to further evaluate.  4. Bone scan on 04/10/17 showed: IMPRESSION: 1. No abnormality is seen in the region of T12 to suggest a bone metastasis when compared to the prior MRI images. 2. Negative total body bone scan other than probable degenerative change of the facet joints of the lower lumbar spine.  5. CXr was negative for metastatic disease. Patient underwent TURBT on 04/02/17 which showed: DIAGNOSIS:  A. BLADDER TUMOR; TURBT:  - PAPILLARY UROTHELIAL CARCINOMA, INVADING MUSCULARIS PROPRIA,  HIGH-GRADE (WHO/ISUP).  6. Case was discussed at Tumor board and patient was thought to have atleast T2 cN0 muscle invasive bladder cancer. He has started neoadjuvant gem/cis chemotherapy. Since his counts did not hold up for D15 gemzar, plan is to give 2 weeks on 1 week of gem and split dose cisplatin. He has received 4 cycles of gem/cis 2 weeks on and 1 week off so far wich he has tolerated very well without significant side effects  7. Scans after 4 cycles of hemotherapy showed: 1. Large intraluminal bladder lesions seen on the prior CT scan is no longer evident. No findings to suggest metastatic disease in the abdomen or pelvis on today's exam. 2. Stable hepatic cysts and other smaller liver lesions, too small to characterize, but likely cysts. 3. No change 1.8 cm cystic  lesion in the uncinate process of the pancreas. Continued attention on follow-up imaging recommended. 4. Stable 4 cm  simple cyst upper pole left kidney. 5. Stable appearance of the focal sclerotic lesion T12 vertebral body. 6. Aortic Atherosclerois (ICD10-170.0)  8. Patient completed 5 cycles of gem/cis chemotherapy  9.Patient underwent laparoscopic radical cystoprostatectomy with bilateral pelvic lymphadenectomy and I ileal conduit diversion with cystoscopy on 09/28/2017. Pathology showed ypT1 a N0 M0. 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 10 lymph nodes were examined and were negative for malignancy.   Interval history-he is doing well overall.  Denies any complaints at this time.  He continues to follow-up with urology and has not had any hematuria or problems with urination.  ECOG PS- 0 Pain scale- 0   Review of systems- Review of Systems  Constitutional: Negative for chills, fever, malaise/fatigue and weight loss.  HENT: Negative for congestion, ear discharge and nosebleeds.   Eyes: Negative for blurred vision.  Respiratory: Negative for cough, hemoptysis, sputum production, shortness of breath and wheezing.   Cardiovascular: Negative for chest pain, palpitations, orthopnea and claudication.  Gastrointestinal: Negative for abdominal pain, blood in stool, constipation, diarrhea, heartburn, melena, nausea and vomiting.  Genitourinary: Negative for dysuria, flank pain, frequency, hematuria and urgency.  Musculoskeletal: Negative for back pain, joint pain and myalgias.  Skin: Negative for rash.  Neurological: Negative for dizziness, tingling, focal weakness, seizures, weakness and headaches.  Endo/Heme/Allergies: Does not bruise/bleed easily.  Psychiatric/Behavioral: Negative for depression and suicidal ideas. The patient does not have insomnia.       No Known Allergies   Past Medical History:  Diagnosis Date  . Cancer [facility])    Bladder  . Diabetes mellitus without complication ([place])   . Dysrhythmia    Atrial fib  . GERD  (gastroesophageal reflux disease)   . Hyperlipidemia   . Hypertension   . Paroxysmal atrial fibrillation ([facility])   . Personal history of chemotherapy   . Pre-diabetes    diet controlled  . Sexual dysfunction      Past Surgical History:  Procedure Laterality Date  . APPENDECTOMY    . COLONOSCOPY WITH PROPOFOL N/A 10/25/2015   Procedure: COLONOSCOPY WITH PROPOFOL;  Surgeon: Manya Silvas, MD;  Location: [facility] ENDOSCOPY;  Service: Endoscopy;  Laterality: N/A;  . CYSTOSCOPY WITH INJECTION N/A 09/28/2017   Procedure: CYSTOSCOPY WITH INJECTION OF INDOCYANINE GREEN DYE;  Surgeon: Alexis Frock, MD;  Location: [facility] ORS;  Service: Urology;  Laterality: N/A;  . ILEOSTOMY    . IR FLUORO GUIDE PORT INSERTION RIGHT  04/27/2017  . IR REMOVAL TUN ACCESS W/ PORT W/O FL MOD SED  08/01/2019  . TRANSURETHRAL RESECTION OF BLADDER TUMOR N/A 04/02/2017   Procedure: TRANSURETHRAL RESECTION OF BLADDER TUMOR (TURBT);  Surgeon: Hollice Espy, MD;  Location: [facility] ORS;  Service: Urology;  Laterality: N/A;    Social History   Socioeconomic History  . Marital status: Married    Spouse name: Margaretha Sheffield  . Number of children: 2  . Years of education: Not on file  . Highest education level: Not on file  Occupational History  . Not on file  Tobacco Use  . Smoking status: Former Smoker    Packs/day: 1.00    Types: Cigarettes    Quit date: 10/21/1997    Years since quitting: 23.2  . Smokeless tobacco: Never Used  Vaping Use  . Vaping Use: Never used  Substance and Sexual Activity  . Alcohol use: Not Currently    Comment:  none in 3 months  . Drug use: No  . Sexual activity: Not on file  Other Topics Concern  . Not on file  Social History Narrative  . Not on file   Social Determinants of Health   Financial Resource Strain: Not on file  Food Insecurity: Not on file  Transportation Needs: Not on file  Physical Activity: Not on file  Stress: Not on file  Social Connections: Not on file  Intimate Partner  Violence: Not on file    Family History  Problem Relation Age of Onset  . Prostate cancer Father   . Stroke Father   . Atrial fibrillation Father   . Diabetes type II Father   . Kidney cancer Neg Hx      Current Outpatient Medications:  .  acetaminophen (TYLENOL) 500 MG tablet, Take 500 mg by mouth every 6 (six) hours as needed., Disp: , Rfl:  .  apixaban (ELIQUIS) 5 MG TABS tablet, Take 5 mg by mouth 2 (two) times daily., Disp: , Rfl:  .  fluticasone (FLONASE) 50 MCG/ACT nasal spray, Place 1 spray into both nostrils at bedtime as needed for allergies or rhinitis., Disp: , Rfl:  .  glimepiride (AMARYL) 1 MG tablet, Take 1 tablet by mouth., Disp: , Rfl:  .  losartan (COZAAR) 50 MG tablet, Take 1 tablet by mouth., Disp: , Rfl:  .  lovastatin (MEVACOR) 20 MG tablet, Take 1 tablet by mouth at bedtime., Disp: , Rfl:  .  metoprolol succinate (TOPROL-XL) 50 MG 24 hr tablet, Take 50 mg by mouth every evening. Take with or immediately following a meal., Disp: , Rfl:  .  omeprazole (PRILOSEC) 20 MG capsule, Take 20 mg by mouth daily as needed., Disp: , Rfl:  .  Travoprost, BAK Free, (TRAVATAN) 0.004 % SOLN ophthalmic solution, Apply to eye., Disp: , Rfl:  .  famotidine (PEPCID AC) 10 MG chewable tablet, Chew 10 mg by mouth daily as needed for heartburn. (Patient not taking: Reported on 01/11/2021), Disp: , Rfl:   Physical exam:  Vitals:   01/11/21 1113  BP: (!) 140/105  Pulse: 89  Resp: 18  Temp: 99 F (37.2 C)  TempSrc: Tympanic  SpO2: 98%  Weight: 212 lb 6.4 oz (96.3 kg)   Physical Exam Constitutional:      General: He is not in acute distress. Eyes:     Extraocular Movements: EOM normal.  Cardiovascular:     Rate and Rhythm: Normal rate and regular rhythm.     Heart sounds: Normal heart sounds.  Pulmonary:     Effort: Pulmonary effort is normal.     Breath sounds: Normal breath sounds.  Abdominal:     General: Bowel sounds are normal.     Palpations: Abdomen is soft.   Skin:    General: Skin is warm and dry.  Neurological:     Mental Status: He is alert and oriented to person, place, and time.      CMP Latest Ref Rng & Units 01/11/2021  Glucose 70 - 99 mg/dL 111(H)  BUN 8 - 23 mg/dL 20  Creatinine 0.61 - 1.24 mg/dL 1.26(H)  Sodium 135 - 145 mmol/L 140  Potassium 3.5 - 5.1 mmol/L 4.2  Chloride 98 - 111 mmol/L 98  CO2 22 - 32 mmol/L 30  Calcium 8.9 - 10.3 mg/dL 9.4  Total Protein 6.5 - 8.1 g/dL 7.5  Total Bilirubin 0.3 - 1.2 mg/dL 0.9  Alkaline Phos 38 - 126 U/L 34(L)  AST 15 -  41 U/L 26  ALT 0 - 44 U/L 27   CBC Latest Ref Rng & Units 01/11/2021  WBC 4.0 - 10.5 K/uL 6.2  Hemoglobin 13.0 - 17.0 g/dL 15.1  Hematocrit 39.0 - 52.0 % 45.2  Platelets 150 - 400 K/uL 174    Assessment and plan- Patient is a 75 y.o. male  muscle invasive bladder cancer Stage II cT2N0cM0 s/p5cycles of neoadjuvant chemotherapy with gemcitabine/ cisplatinfollowed by laparoscopy-assisted cystoprostatectomy (ypT1 N0). This is a routine follow-up visit  From a bladder cancer standpoint patient is doing well and this would be year 3 of surveillance.  At this time he can continue to get yearly scans which will be coordinated by urology.  Patient also found to have a pancreatic cyst which we have been monitoring for the last 2 years to ensure stability.  He would be due for repeat MRI  In June 2022 which we will schedule and I will see him following that  Visit Diagnosis 1. Pancreatic cyst   2. Encounter for follow-up surveillance of bladder cancer   3. Cyst of pancreas      Dr. Randa Evens, MD, MPH [facility] at [facility] [id number] 01/13/2021 4:23 PM

## 2021-06-09 ENCOUNTER — Ambulatory Visit
Admission: RE | Admit: 2021-06-09 | Discharge: 2021-06-09 | Disposition: A | Discharge status: Home / Self Care | Payer: Medicare Other | Source: Ambulatory Visit | Attending: Oncology | Admitting: Oncology

## 2021-06-09 ENCOUNTER — Other Ambulatory Visit: Payer: Self-pay

## 2021-06-09 DIAGNOSIS — K862 Cyst of pancreas: Secondary | ICD-10-CM | POA: Diagnosis present

## 2021-06-09 MED ORDER — GADOBUTROL 1 MMOL/ML IV SOLN
9.0000 mL | Freq: Once | INTRAVENOUS | Status: AC | PRN
  Administered 2021-06-09: 9 mL via INTRAVENOUS

## 2021-06-10 ENCOUNTER — Inpatient Hospital Stay: Payer: Medicare Other | Attending: Oncology | Admitting: Oncology

## 2021-06-10 DIAGNOSIS — Z8551 Personal history of malignant neoplasm of bladder: Secondary | ICD-10-CM | POA: Diagnosis not present

## 2021-06-10 DIAGNOSIS — Z08 Encounter for follow-up examination after completed treatment for malignant neoplasm: Secondary | ICD-10-CM

## 2021-06-10 DIAGNOSIS — K862 Cyst of pancreas: Secondary | ICD-10-CM

## 2021-06-10 NOTE — Progress Notes (Signed)
Patient here for initial oncology appointment, expresses no complaints or concerns at this time.   

## 2021-06-12 NOTE — Progress Notes (Signed)
I connected with Randy Wheeler on 06/12/21 at  2:15 PM EDT by video enabled telemedicine visit and verified that I am speaking with the correct person using two identifiers.   I discussed the limitations, risks, security and privacy concerns of performing an evaluation and management service by telemedicine and the availability of in-person appointments. I also discussed with the patient that there may be a patient responsible charge related to this service. The patient expressed understanding and agreed to proceed.  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  ypT1aN0 s/p neoadjuvant chemotherapy followed by surgery    2.  Pancreatic cyst  Chief Complaint:  routine f/u of bladder ca and pancreatic cyst  History of present illness: Patient is a 75 year old male with a history of T2 N0 M0 muscle invasive bladder cancer that was diagnosed in April 2018.  He underwent neoadjuvant concurrent chemoradiation with gemcitabine cisplatin chemotherapy 5 cycles.   Patient underwent laparoscopic radical cystoprostatectomy with bilateral pelvic lymphadenectomy and I ileal conduit diversion with cystoscopy on 09/28/2017.  Pathology showed yp T1 a N0 M0.  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  10 lymph nodes were examined and were negative for malignancy.  Patient also has a history of pancreatic cyst which is being followed by yearly MRIs.  Interval history patient is currently doing well and denies any specific complaints at this time.  No hematuria.  Denies any new aches and pains anywhere.  Appetite and weight have remained stable   Review of Systems  Constitutional:  Negative for chills, fever, malaise/fatigue and weight loss.  HENT:  Negative for congestion, ear discharge and nosebleeds.    Eyes:  Negative for blurred vision.  Respiratory:  Negative for cough, hemoptysis, sputum production, shortness of breath and wheezing.   Cardiovascular:  Negative for chest pain, palpitations, orthopnea and claudication.  Gastrointestinal:  Negative for abdominal pain, blood in stool, constipation, diarrhea, heartburn, melena, nausea and vomiting.  Genitourinary:  Negative for dysuria, flank pain, frequency, hematuria and urgency.  Musculoskeletal:  Negative for back pain, joint pain and myalgias.  Skin:  Negative for rash.  Neurological:  Negative for dizziness, tingling, focal weakness, seizures, weakness and headaches.  Endo/Heme/Allergies:  Does not bruise/bleed easily.  Psychiatric/Behavioral:  Negative for depression and suicidal ideas. The patient does not have insomnia.    No Known Allergies  Past Medical History:  Diagnosis Date   Cancer ([place])    Bladder   Diabetes mellitus without complication (HCC)    Dysrhythmia    Atrial fib   GERD (gastroesophageal reflux disease)    Hyperlipidemia    Hypertension    Paroxysmal atrial fibrillation (HCC)    Personal history of chemotherapy    Pre-diabetes    diet controlled   Sexual dysfunction     Past Surgical History:  Procedure Laterality Date   APPENDECTOMY     COLONOSCOPY WITH PROPOFOL N/A 10/25/2015   Procedure: COLONOSCOPY WITH PROPOFOL;  Surgeon: Manya Silvas, MD;  Location: [place];  Service: Endoscopy;  Laterality: N/A;   CYSTOSCOPY WITH INJECTION N/A 09/28/2017   Procedure: CYSTOSCOPY WITH INJECTION OF INDOCYANINE GREEN DYE;  Surgeon: Alexis Frock, MD;  Location: [facility];  Service: Urology;  Laterality: N/A;   ILEOSTOMY     IR FLUORO GUIDE PORT INSERTION RIGHT  04/27/2017   IR REMOVAL TUN  ACCESS W/ PORT W/O FL MOD SED  08/01/2019   TRANSURETHRAL RESECTION OF BLADDER TUMOR N/A 04/02/2017   Procedure: TRANSURETHRAL RESECTION OF BLADDER TUMOR (TURBT);  Surgeon: Hollice Espy, MD;  Location: [facility];   Service: Urology;  Laterality: N/A;    Social History   Socioeconomic History   Marital status: Married    Spouse name: Margaretha Sheffield   Number of children: 2   Years of education: Not on file   Highest education level: Not on file  Occupational History   Not on file  Tobacco Use   Smoking status: Former    Packs/day: 1.00    Pack years: 0.00    Types: Cigarettes    Quit date: 10/21/1997    Years since quitting: 23.6   Smokeless tobacco: Never  Vaping Use   Vaping Use: Never used  Substance and Sexual Activity   Alcohol use: Not Currently    Comment: none in 3 months   Drug use: No   Sexual activity: Not on file  Other Topics Concern   Not on file  Social History Narrative   Not on file   Social Determinants of Health   Financial Resource Strain: Not on file  Food Insecurity: Not on file  Transportation Needs: Not on file  Physical Activity: Not on file  Stress: Not on file  Social Connections: Not on file  Intimate Partner Violence: Not on file    Family History  Problem Relation Age of Onset   Prostate cancer Father    Stroke Father    Atrial fibrillation Father    Diabetes type II Father    Kidney cancer Neg Hx      Current Outpatient Medications:    acetaminophen (TYLENOL) 500 MG tablet, Take 500 mg by mouth every 6 (six) hours as needed., Disp: , Rfl:    apixaban (ELIQUIS) 5 MG TABS tablet, Take 5 mg by mouth 2 (two) times daily., Disp: , Rfl:    fluticasone (FLONASE) 50 MCG/ACT nasal spray, Place 1 spray into both nostrils at bedtime as needed for allergies or rhinitis., Disp: , Rfl:    glimepiride (AMARYL) 1 MG tablet, Take 1 tablet by mouth., Disp: , Rfl:    losartan (COZAAR) 50 MG tablet, Take 1 tablet by mouth., Disp: , Rfl:    lovastatin (MEVACOR) 20 MG tablet, Take 1 tablet by mouth at bedtime., Disp: , Rfl:    metoprolol succinate (TOPROL-XL) 50 MG 24 hr tablet, Take 50 mg by mouth every evening. Take with or immediately following a meal., Disp: , Rfl:     omeprazole (PRILOSEC) 20 MG capsule, Take 20 mg by mouth daily as needed., Disp: , Rfl:    Travoprost, BAK Free, (TRAVATAN) 0.004 % SOLN ophthalmic solution, Apply to eye., Disp: , Rfl:    famotidine (PEPCID AC) 10 MG chewable tablet, Chew 10 mg by mouth daily as needed for heartburn. (Patient not taking: No sig reported), Disp: , Rfl:   MR Abdomen W Wo Contrast  Result Date: 06/09/2021 CLINICAL DATA:  Follow-up pancreatic cyst/pseudocyst EXAM: MRI ABDOMEN WITHOUT AND WITH CONTRAST TECHNIQUE: Multiplanar multisequence MR imaging of the abdomen was performed both before and after the administration of intravenous contrast. CONTRAST:  43mL GADAVIST GADOBUTROL 1 MMOL/ML IV SOLN COMPARISON:  Multiple priors including most recent MRI abdomen June 11, 2020 FINDINGS: Lower chest: No acute abnormality. Hepatobiliary: Diffuse hepatic steatosis. Bilobar hepatic cysts. No suspicious hepatic lesions. Gallbladder is unremarkable. No biliary ductal dilation. Pancreas: Multiple tiny cystic pancreatic lesions  are again visualized with the largest index lesion in the uncinate process of the pancreas measuring 1.9 x 1.7 x 1.3 cm on image 20/3 and 24/4, unchanged. No discrete ductal communication. No suspicious enhancing features. No pancreatic ductal dilation no other dominant or suspicious enlarging cystic pancreatic lesion. Spleen:  Within normal limits. Adrenals/Urinary Tract: Bilateral adrenal glands are unremarkable. No hydronephrosis. Bosniak classification 1 left upper pole renal cyst. No solid enhancing renal lesions. Stomach/Bowel: Right lower quadrant ileal conduit. Left-sided colonic diverticulosis without findings of acute diverticulitis. Vascular/Lymphatic: No pathologically enlarged lymph nodes identified. No abdominal aortic aneurysm demonstrated. Other:  No abdominal ascites. Musculoskeletal: No suspicious bone lesions identified. IMPRESSION: 1. 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 Documenting at least 4 years of stability. Recommend follow up pre and post contrast MRI/MRCP or pancreatic protocol CT in 1 year. This recommendation follows ACR consensus guidelines: Management of Incidental Pancreatic Cysts: A White Paper of the ACR Incidental Findings Committee. [place] [id number]. 2. Diffuse hepatic steatosis. 3. Right lower quadrant ileal conduit. 4. Left-sided colonic diverticulosis without findings of acute diverticulitis. Electronically Signed   By: Dahlia Bailiff MD   On: 06/09/2021 12:43    No images are attached to the encounter.   CMP Latest Ref Rng & Units 01/11/2021  Glucose 70 - 99 mg/dL 111(H)  BUN 8 - 23 mg/dL 20  Creatinine 0.61 - 1.24 mg/dL 1.26(H)  Sodium 135 - 145 mmol/L 140  Potassium 3.5 - 5.1 mmol/L 4.2  Chloride 98 - 111 mmol/L 98  CO2 22 - 32 mmol/L 30  Calcium 8.9 - 10.3 mg/dL 9.4  Total Protein 6.5 - 8.1 g/dL 7.5  Total Bilirubin 0.3 - 1.2 mg/dL 0.9  Alkaline Phos 38 - 126 U/L 34(L)  AST 15 - 41 U/L 26  ALT 0 - 44 U/L 27   CBC Latest Ref Rng & Units 01/11/2021  WBC 4.0 - 10.5 K/uL 6.2  Hemoglobin 13.0 - 17.0 g/dL 15.1  Hematocrit 39.0 - 52.0 % 45.2  Platelets 150 - 400 K/uL 174     Observation/objective: Appears in no acute distress over video visit today.  Breathing is nonlabored  Assessment and plan:Patient is a 75 y.o. male  muscle invasive bladder cancer Stage II cT2N0cM0 s/p 5 cycles of neoadjuvant chemotherapy with gemcitabine/ cisplatin followed by laparoscopy-assisted cystoprostatectomy (yp T1 N0).  This is a routine follow-up visit for following issues:  Bladder cancer: This is year 4 since diagnosis and patient is doing well overall.  He has an ilial conduit and also follows up with [name] at [place].  He gets his surveillance scans through urology.  2.   Pancreatic cyst: This is being followed by yearly MRIs.  He has a 1.9 x 1.7 x 1.3 cm lesion in the uncinate process of the pancreas stable over the last 4 years.  Recommend MRI in 1 year per radiology recommendations and I will see him thereafter  Follow-up instructions: As above  I discussed the assessment and treatment plan with the patient. The patient was provided an opportunity to ask questions and all were answered. The patient agreed with the plan and demonstrated an understanding of the instructions.   The patient was advised to call back or seek an in-person evaluation if the symptoms worsen or if the condition fails to improve as anticipated.  Visit Diagnosis: 1. Encounter for  follow-up surveillance of bladder cancer   2. Pancreatic cyst     Dr. Randa Evens, MD, MPH [facility] at [facility] Tel- [phone] 06/12/2021 5:49 PM

## 2021-07-07 ENCOUNTER — Other Ambulatory Visit (HOSPITAL_COMMUNITY): Payer: Self-pay | Admitting: Urology

## 2021-07-07 ENCOUNTER — Ambulatory Visit (HOSPITAL_COMMUNITY)
Admission: RE | Admit: 2021-07-07 | Discharge: 2021-07-07 | Disposition: A | Discharge status: Home / Self Care | Payer: Medicare Other | Source: Ambulatory Visit | Attending: Urology | Admitting: Urology

## 2021-07-07 ENCOUNTER — Other Ambulatory Visit: Payer: Self-pay

## 2021-07-07 DIAGNOSIS — C670 Malignant neoplasm of trigone of bladder: Secondary | ICD-10-CM

## 2022-06-05 ENCOUNTER — Ambulatory Visit
Admission: RE | Admit: 2022-06-05 | Discharge: 2022-06-05 | Disposition: A | Discharge status: Home / Self Care | Payer: Medicare Other | Source: Ambulatory Visit | Attending: Oncology | Admitting: Oncology

## 2022-06-05 DIAGNOSIS — Z08 Encounter for follow-up examination after completed treatment for malignant neoplasm: Secondary | ICD-10-CM | POA: Insufficient documentation

## 2022-06-05 DIAGNOSIS — Z8551 Personal history of malignant neoplasm of bladder: Secondary | ICD-10-CM | POA: Insufficient documentation

## 2022-06-05 MED ORDER — GADOBUTROL 1 MMOL/ML IV SOLN
10.0000 mL | Freq: Once | INTRAVENOUS | Status: AC | PRN
  Administered 2022-06-05: 10 mL via INTRAVENOUS

## 2022-06-12 ENCOUNTER — Encounter: Payer: Self-pay | Admitting: Oncology

## 2022-06-12 ENCOUNTER — Inpatient Hospital Stay: Payer: Medicare Other | Attending: Oncology | Admitting: Oncology

## 2022-06-12 VITALS — BP 144/90 | HR 81 | Temp 97.5°F | Resp 18 | Ht 72.0 in | Wt 214.0 lb

## 2022-06-12 DIAGNOSIS — Z87891 Personal history of nicotine dependence: Secondary | ICD-10-CM | POA: Insufficient documentation

## 2022-06-12 DIAGNOSIS — K862 Cyst of pancreas: Secondary | ICD-10-CM

## 2022-06-12 DIAGNOSIS — Z8551 Personal history of malignant neoplasm of bladder: Secondary | ICD-10-CM | POA: Diagnosis present

## 2022-06-12 DIAGNOSIS — Z08 Encounter for follow-up examination after completed treatment for malignant neoplasm: Secondary | ICD-10-CM

## 2022-06-12 NOTE — Progress Notes (Signed)
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  ypT1aN0 s/p neoadjuvant chemotherapy followed by surgery    2.  Pancreatic cyst  Chief complaint/ Reason for visit-routine follow-up of pancreatic cyst  Heme/Onc history: 1. Patient is a 76 year old male with a past medical history significant for atrial fibrillation for which he was on Eliquis in the past. In August 2017 patient had minor trauma to his abdomen while bone pain and then left hematuria or immediately following that which lasted for about a couple of days. Patient thought that this was likely secondary to trauma . However continued to have intermittent hematuria since then and then had a second major episode of bleeding in March 2018. He spoke to his primary care doctor who suggested to stop Eliquis at this time. However bleeding continued for 2-3 days after stopping Eliquis and hence he was referred to urology [name]   2. Ct abdomen on 03/09/17 showed: IMPRESSION: 1. 5.6 cm polypoid mass arises from the posterior right bladder wall, consistent with urothelial neoplasm. 2. 2 cm exophytic hypoattenuating lesion identified along the uncinate process of the pancreas. Dedicated abdominal MRI without and with contrast may prove helpful to further evaluate.   3. MRI abdomen on 03/30/17 showed: . 1.8 cm simple appearing cystic lesion exophytic from the uncinate process of the pancreas is assisted with other scattered tiny pancreatic parenchymal cyst.  Repeat MRI in 12 months is recommended. This recommendation follows ACR consensus guidelines: Management of Incidental Pancreatic Cysts: A White Paper of the ACR Incidental Findings Committee. J Am Coll Radiol 5093;26:712-458. 2. 1 cm enhancing focus identified in the anterior aspect of the T12 vertebral body. Imaging features are not suggestive of cavernous hemangioma. Given the history of bladder cancer, metastatic involvement cannot be excluded. Bone scan may prove helpful to further evaluate.   4. Bone scan on 04/10/17 showed: IMPRESSION: 1. No abnormality is seen in the region of T12 to suggest a bone metastasis when compared to the prior MRI images. 2. Negative total body bone scan other than probable degenerative change of the facet joints of the lower lumbar spine.   5. CXr was negative for metastatic disease. Patient underwent TURBT on 04/02/17 which showed: DIAGNOSIS:  A. BLADDER TUMOR; TURBT:  - PAPILLARY UROTHELIAL CARCINOMA, INVADING MUSCULARIS PROPRIA,  HIGH-GRADE (WHO/ISUP).   6. Case was discussed at Tumor board and patient was thought to have atleast T2 cN0 muscle invasive bladder cancer. He has started neoadjuvant gem/cis chemotherapy. Since his counts did not hold up for D15 gemzar, plan is to give 2 weeks on 1 week of gem and split dose cisplatin. He has received 4 cycles of gem/cis 2 weeks on and 1 week off so far wich he has tolerated very well without significant side effects   7. Scans after 4 cycles of hemotherapy showed: 1. Large intraluminal bladder lesions seen on the prior CT scan is no longer evident. No findings to suggest metastatic disease in the abdomen or pelvis on today's exam. 2. Stable hepatic cysts and other smaller liver lesions, too small to characterize, but likely  cysts. 3. No change 1.8 cm cystic lesion in the uncinate process of the pancreas. Continued attention on follow-up imaging recommended. 4. Stable 4 cm simple cyst upper pole left  kidney. 5. Stable appearance of the focal sclerotic lesion T12 vertebral body. 6.  Aortic Atherosclerois (ICD10-170.0)   8. Patient completed 5 cycles of gem/cis chemotherapy    9.  Patient underwent laparoscopic radical cystoprostatectomy with bilateral pelvic lymphadenectomy and I ileal conduit diversion with cystoscopy on 09/28/2017.  Pathology showed yp T1 a N0 M0.  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  10 lymph nodes were examined and were negative for malignancy.    Interval history-patient is doing well and denies any specific complaints at this time.  He continues to follow-up with [name] for his bladder cancer.  ECOG PS- 1 Pain scale- 0   Review of systems- Review of Systems  Constitutional:  Negative for chills, fever, malaise/fatigue and weight loss.  HENT:  Negative for congestion, ear discharge and nosebleeds.   Eyes:  Negative for blurred vision.  Respiratory:  Negative for cough, hemoptysis, sputum production, shortness of breath and wheezing.   Cardiovascular:  Negative for chest pain, palpitations, orthopnea and claudication.  Gastrointestinal:  Negative for abdominal pain, blood in stool, constipation, diarrhea, heartburn, melena, nausea and vomiting.  Genitourinary:  Negative for dysuria, flank pain, frequency, hematuria and urgency.  Musculoskeletal:  Negative for back pain, joint pain and myalgias.  Skin:  Negative for rash.  Neurological:  Negative for dizziness, tingling, focal weakness, seizures, weakness and headaches.  Endo/Heme/Allergies:  Does not bruise/bleed easily.  Psychiatric/Behavioral:  Negative for depression and suicidal ideas. The patient does not have insomnia.       No Known Allergies   Past Medical History:  Diagnosis Date   Cancer ([place])    Bladder   Diabetes mellitus without complication (HCC)    Dysrhythmia    Atrial fib   GERD (gastroesophageal reflux disease)    Glaucoma     Hyperlipidemia    Hypertension    Paroxysmal atrial fibrillation (HCC)    Personal history of chemotherapy    Pre-diabetes    diet controlled   Sexual dysfunction      Past Surgical History:  Procedure Laterality Date   APPENDECTOMY     COLONOSCOPY WITH PROPOFOL N/A 10/25/2015   Procedure: COLONOSCOPY WITH PROPOFOL;  Surgeon: Manya Silvas, MD;  Location: [place];  Service: Endoscopy;  Laterality: N/A;   CYSTOSCOPY WITH INJECTION N/A 09/28/2017   Procedure: CYSTOSCOPY WITH INJECTION OF INDOCYANINE GREEN DYE;  Surgeon: Alexis Frock, MD;  Location: [facility];  Service: Urology;  Laterality: N/A;   ILEOSTOMY     IR FLUORO GUIDE PORT INSERTION RIGHT  04/27/2017   IR REMOVAL TUN ACCESS W/ PORT W/O FL MOD SED  08/01/2019   TRANSURETHRAL RESECTION OF BLADDER TUMOR N/A 04/02/2017   Procedure: TRANSURETHRAL RESECTION OF BLADDER TUMOR (TURBT);  Surgeon: Hollice Espy, MD;  Location: [facility];  Service: Urology;  Laterality: N/A;    Social History   Socioeconomic History   Marital status: Married    Spouse name: Margaretha Sheffield   Number of children: 2   Years of education: Not on file   Highest education level: Not on file  Occupational History   Not on file  Tobacco Use   Smoking status: Former    Packs/day: 1.00    Types: Cigarettes    Quit date: 10/21/1997    Years since quitting: 24.6  Smokeless tobacco: Never  Vaping Use   Vaping Use: Never used  Substance and Sexual Activity   Alcohol use: Not Currently    Comment: none in 3 months   Drug use: No   Sexual activity: Not on file  Other Topics Concern   Not on file  Social History Narrative   Not on file   Social Determinants of Health   Financial Resource Strain: Low Risk  (08/01/2019)   Overall Financial Resource Strain (CARDIA)    Difficulty of Paying Living Expenses: Not hard at all  Food Insecurity: No Food Insecurity (08/01/2019)   Hunger Vital Sign    Worried About Running Out of Food in the Last Year: Never  true    Ran Out of Food in the Last Year: Never true  Transportation Needs: No Transportation Needs (08/01/2019)   PRAPARE - Hydrologist (Medical): No    Lack of Transportation (Non-Medical): No  Physical Activity: Not on file  Stress: Not on file  Social Connections: Socially Integrated (08/01/2019)   Social Connection and Isolation Panel [NHANES]    Frequency of Communication with Friends and Family: More than three times a week    Frequency of Social Gatherings with Friends and Family: More than three times a week    Attends Religious Services: More than 4 times per year    Active Member of [employer] or Organizations: Yes    Attends Archivist Meetings: 1 to 4 times per year    Marital Status: Married  Human resources officer Violence: Not At Risk (08/01/2019)   Humiliation, Afraid, Rape, and Kick questionnaire    Fear of Current or Ex-Partner: No    Emotionally Abused: No    Physically Abused: No    Sexually Abused: No    Family History  Problem Relation Age of Onset   Prostate cancer Father    Stroke Father    Atrial fibrillation Father    Diabetes type II Father    Kidney cancer Neg Hx      Current Outpatient Medications:    acetaminophen (TYLENOL) 500 MG tablet, Take 500 mg by mouth every 6 (six) hours as needed., Disp: , Rfl:    apixaban (ELIQUIS) 5 MG TABS tablet, Take 5 mg by mouth 2 (two) times daily., Disp: , Rfl:    famotidine (PEPCID AC) 10 MG chewable tablet, Chew 10 mg by mouth daily as needed for heartburn., Disp: , Rfl:    fluticasone (FLONASE) 50 MCG/ACT nasal spray, Place 1 spray into both nostrils at bedtime as needed for allergies or rhinitis., Disp: , Rfl:    metoprolol succinate (TOPROL-XL) 50 MG 24 hr tablet, Take 50 mg by mouth every evening. Take with or immediately following a meal., Disp: , Rfl:    omeprazole (PRILOSEC) 20 MG capsule, Take 20 mg by mouth daily as needed., Disp: , Rfl:    timolol (TIMOPTIC-XR) 0.25 %  ophthalmic gel-forming, Place 1 drop into both eyes daily., Disp: , Rfl:    glimepiride (AMARYL) 1 MG tablet, Take 1 tablet by mouth., Disp: , Rfl:    losartan (COZAAR) 50 MG tablet, Take 1 tablet by mouth., Disp: , Rfl:    lovastatin (MEVACOR) 20 MG tablet, Take 1 tablet by mouth at bedtime., Disp: , Rfl:    Travoprost, BAK Free, (TRAVATAN) 0.004 % SOLN ophthalmic solution, Apply to eye., Disp: , Rfl:   Physical exam:  Vitals:   06/12/22 1311  BP: (!) 144/90  Pulse: 81  Resp: 18  Temp: (!) 97.5 F (36.4 C)  TempSrc: Tympanic  SpO2: 99%  Weight: 214 lb (97.1 kg)  Height: 6' (1.829 m)   Physical Exam Cardiovascular:     Rate and Rhythm: Normal rate and regular rhythm.     Heart sounds: Normal heart sounds.  Pulmonary:     Effort: Pulmonary effort is normal.  Skin:    General: Skin is warm and dry.  Neurological:     Mental Status: He is alert and oriented to person, place, and time.         Latest Ref Rng & Units 01/11/2021   11:00 AM  CMP  Glucose 70 - 99 mg/dL 111   BUN 8 - 23 mg/dL 20   Creatinine 0.61 - 1.24 mg/dL 1.26   Sodium 135 - 145 mmol/L 140   Potassium 3.5 - 5.1 mmol/L 4.2   Chloride 98 - 111 mmol/L 98   CO2 22 - 32 mmol/L 30   Calcium 8.9 - 10.3 mg/dL 9.4   Total Protein 6.5 - 8.1 g/dL 7.5   Total Bilirubin 0.3 - 1.2 mg/dL 0.9   Alkaline Phos 38 - 126 U/L 34   AST 15 - 41 U/L 26   ALT 0 - 44 U/L 27       Latest Ref Rng & Units 01/11/2021   11:00 AM  CBC  WBC 4.0 - 10.5 K/uL 6.2   Hemoglobin 13.0 - 17.0 g/dL 15.1   Hematocrit 39.0 - 52.0 % 45.2   Platelets 150 - 400 K/uL 174     No images are attached to the encounter.  MR Abdomen W Wo Contrast  Result Date: 06/06/2022 CLINICAL DATA:  Follow-up cystic pancreatic lesion. History of invasive bladder cancer post chemotherapy. EXAM: MRI ABDOMEN WITHOUT AND WITH CONTRAST TECHNIQUE: Multiplanar multisequence MR imaging of the abdomen was performed both before and after the administration of  intravenous contrast. CONTRAST:  33m GADAVIST GADOBUTROL 1 MMOL/ML IV SOLN COMPARISON:  Multiple priors including most recent MRI June 09, 2021 FINDINGS: Lower chest: No acute abnormality. Hepatobiliary: Severe diffuse hepatic steatosis. Bilobar hepatic cysts are similar prior. Gallbladder is unremarkable. No biliary ductal dilation. Pancreas: Again seen are multiple cystic pancreatic lesions the largest of which is a multilobular lesion in the pancreatic uncinate process which measures 19 x 16 x 18 mm on images 25/4 and 18/3 previously measuring 19 x 15 x 20 mm when remeasured for consistency. None of the lesions demonstrate suspicious postcontrast enhancement or mural/septal thickening. No evidence of ductal communication. No pancreatic ductal dilation. Spleen:  Within normal limits in size and appearance. Adrenals/Urinary Tract: Bilateral adrenal glands appear normal. Benign Bosniak classification 1 left upper pole renal cyst requires no independent imaging follow-up. No solid enhancing renal mass. Stomach/Bowel: Right lower quadrant ileal conduit. Left-sided colonic diverticulosis without findings of acute diverticulitis. Vascular/Lymphatic: No pathologically enlarged lymph nodes in the abdomen or pelvis. Normal caliber abdominal aorta. Portal, splenic and superior mesenteric veins are patent. Other:  No significant abdominal free fluid. Musculoskeletal: No suspicious bone lesions identified. IMPRESSION: 1. 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 Recommend follow up pre and post contrast MRI/MRCP in 2 years. This recommendation follows ACR consensus guidelines: Management of Incidental Pancreatic Cysts: A White Paper of the ACR Incidental Findings Committee. [id number];65:537-482 2. Severe diffuse hepatic steatosis. 3. Right lower quadrant  ileal  conduit. 4. Left-sided colonic diverticulosis without findings of acute diverticulitis. Electronically Signed   By: Dahlia Bailiff M.D.   On: 06/06/2022 16:44     Assessment and plan- Patient is a 76 y.o. male Here for follow-up of following issues  Pancreatic cyst: MRI from June 05, 2022 showed stable size of pancreatic the cystic lesions measuring up to 1.9 cm.  These have been stable over the last 5 years.  Repeat MRI with MRCP was recommended in 2 years and this can be done by his primary care Dr. Netty Starring.   Surveillance bladder cancer: Patient continues to follow-up with Dr. Bess Harvest for cystoscopies and surveillance scans and so far has not had any evidence of recurrence.  No follow-up required with me at this time   Visit Diagnosis 1. Pancreatic cyst   2. Encounter for follow-up surveillance of bladder cancer      Dr. Randa Evens, MD, MPH [facility] [phone] 06/12/2022 3:31 PM

## 2022-06-12 NOTE — Progress Notes (Signed)
Pt reports recently starting timolol eye drops for glaucoma. Pt reports bp has been running high but is following up with pcp for same. BP 144/90

## 2022-06-17 ENCOUNTER — Encounter: Payer: Self-pay | Admitting: Oncology

## 2022-08-10 ENCOUNTER — Other Ambulatory Visit (HOSPITAL_COMMUNITY): Payer: Self-pay | Admitting: Urology

## 2022-08-10 ENCOUNTER — Ambulatory Visit (HOSPITAL_COMMUNITY)
Admission: RE | Admit: 2022-08-10 | Discharge: 2022-08-10 | Disposition: A | Discharge status: Home / Self Care | Payer: Medicare Other | Source: Ambulatory Visit | Attending: Urology | Admitting: Urology

## 2022-08-10 DIAGNOSIS — C670 Malignant neoplasm of trigone of bladder: Secondary | ICD-10-CM

## 2024-08-04 ENCOUNTER — Ambulatory Visit (HOSPITAL_COMMUNITY)
Admission: RE | Admit: 2024-08-04 | Discharge: 2024-08-04 | Disposition: A | Discharge status: Home / Self Care | Source: Ambulatory Visit | Attending: Urology | Admitting: Urology

## 2024-08-04 ENCOUNTER — Other Ambulatory Visit (HOSPITAL_COMMUNITY): Payer: Self-pay | Admitting: Urology

## 2024-08-04 DIAGNOSIS — C670 Malignant neoplasm of trigone of bladder: Secondary | ICD-10-CM
